# Patient Record
Sex: Female | Born: 1950 | Race: White | Hispanic: No | Marital: Married | Smoking: Never smoker
Health system: Southern US, Community
[De-identification: ages and names within clinical notes are randomized; demographics above are authoritative.]

## PROBLEM LIST (undated history)

## (undated) DIAGNOSIS — IMO0001 Reserved for inherently not codable concepts without codable children: Secondary | ICD-10-CM

## (undated) DIAGNOSIS — T8859XA Other complications of anesthesia, initial encounter: Secondary | ICD-10-CM

## (undated) DIAGNOSIS — I10 Essential (primary) hypertension: Secondary | ICD-10-CM

## (undated) DIAGNOSIS — T148XXA Other injury of unspecified body region, initial encounter: Secondary | ICD-10-CM

## (undated) DIAGNOSIS — E785 Hyperlipidemia, unspecified: Secondary | ICD-10-CM

## (undated) DIAGNOSIS — A0811 Acute gastroenteropathy due to Norwalk agent: Secondary | ICD-10-CM

## (undated) DIAGNOSIS — R000 Tachycardia, unspecified: Secondary | ICD-10-CM

## (undated) DIAGNOSIS — M722 Plantar fascial fibromatosis: Secondary | ICD-10-CM

## (undated) DIAGNOSIS — R112 Nausea with vomiting, unspecified: Secondary | ICD-10-CM

## (undated) DIAGNOSIS — Z9889 Other specified postprocedural states: Secondary | ICD-10-CM

## (undated) DIAGNOSIS — R011 Cardiac murmur, unspecified: Secondary | ICD-10-CM

## (undated) DIAGNOSIS — T4145XA Adverse effect of unspecified anesthetic, initial encounter: Secondary | ICD-10-CM

## (undated) DIAGNOSIS — N6019 Diffuse cystic mastopathy of unspecified breast: Secondary | ICD-10-CM

## (undated) DIAGNOSIS — I2510 Atherosclerotic heart disease of native coronary artery without angina pectoris: Secondary | ICD-10-CM

## (undated) DIAGNOSIS — M1990 Unspecified osteoarthritis, unspecified site: Secondary | ICD-10-CM

## (undated) HISTORY — PX: HYSTEROSCOPY: SHX211

## (undated) HISTORY — DX: Essential (primary) hypertension: I10

## (undated) HISTORY — DX: Other injury of unspecified body region, initial encounter: T14.8XXA

## (undated) HISTORY — DX: Tachycardia, unspecified: R00.0

## (undated) HISTORY — DX: Acute gastroenteropathy due to Norwalk agent: A08.11

## (undated) HISTORY — DX: Atherosclerotic heart disease of native coronary artery without angina pectoris: I25.10

## (undated) HISTORY — DX: Hyperlipidemia, unspecified: E78.5

## (undated) HISTORY — DX: Diffuse cystic mastopathy of unspecified breast: N60.19

## (undated) HISTORY — PX: COLONOSCOPY: SHX174

## (undated) HISTORY — DX: Plantar fascial fibromatosis: M72.2

---

## 1999-03-17 ENCOUNTER — Other Ambulatory Visit: Admission: RE | Admit: 1999-03-17 | Discharge: 1999-03-17 | Payer: Self-pay | Admitting: *Deleted

## 1999-03-23 ENCOUNTER — Encounter: Payer: Self-pay | Admitting: *Deleted

## 1999-03-23 ENCOUNTER — Ambulatory Visit (HOSPITAL_COMMUNITY): Admission: RE | Admit: 1999-03-23 | Discharge: 1999-03-23 | Payer: Self-pay | Admitting: *Deleted

## 2000-04-08 ENCOUNTER — Other Ambulatory Visit: Admission: RE | Admit: 2000-04-08 | Discharge: 2000-04-08 | Payer: Self-pay | Admitting: *Deleted

## 2000-04-15 ENCOUNTER — Ambulatory Visit (HOSPITAL_COMMUNITY): Admission: RE | Admit: 2000-04-15 | Discharge: 2000-04-15 | Payer: Self-pay | Admitting: Obstetrics and Gynecology

## 2000-04-15 ENCOUNTER — Encounter (INDEPENDENT_AMBULATORY_CARE_PROVIDER_SITE_OTHER): Payer: Self-pay | Admitting: Specialist

## 2001-05-19 ENCOUNTER — Other Ambulatory Visit: Admission: RE | Admit: 2001-05-19 | Discharge: 2001-05-19 | Payer: Self-pay | Admitting: *Deleted

## 2002-05-25 ENCOUNTER — Other Ambulatory Visit: Admission: RE | Admit: 2002-05-25 | Discharge: 2002-05-25 | Payer: Self-pay | Admitting: Obstetrics and Gynecology

## 2003-06-07 ENCOUNTER — Other Ambulatory Visit: Admission: RE | Admit: 2003-06-07 | Discharge: 2003-06-07 | Payer: Self-pay | Admitting: Obstetrics and Gynecology

## 2004-07-10 ENCOUNTER — Other Ambulatory Visit: Admission: RE | Admit: 2004-07-10 | Discharge: 2004-07-10 | Payer: Self-pay | Admitting: Obstetrics and Gynecology

## 2004-12-13 DIAGNOSIS — M722 Plantar fascial fibromatosis: Secondary | ICD-10-CM

## 2004-12-13 HISTORY — DX: Plantar fascial fibromatosis: M72.2

## 2005-09-24 ENCOUNTER — Other Ambulatory Visit: Admission: RE | Admit: 2005-09-24 | Discharge: 2005-09-24 | Payer: Self-pay | Admitting: Obstetrics and Gynecology

## 2006-10-14 ENCOUNTER — Other Ambulatory Visit: Admission: RE | Admit: 2006-10-14 | Discharge: 2006-10-14 | Payer: Self-pay | Admitting: Obstetrics and Gynecology

## 2006-12-13 HISTORY — PX: SPINAL FUSION: SHX223

## 2007-03-29 ENCOUNTER — Inpatient Hospital Stay (HOSPITAL_COMMUNITY): Admission: RE | Admit: 2007-03-29 | Discharge: 2007-03-30 | Payer: Self-pay | Admitting: Neurosurgery

## 2007-12-15 ENCOUNTER — Other Ambulatory Visit: Admission: RE | Admit: 2007-12-15 | Discharge: 2007-12-15 | Payer: Self-pay | Admitting: Obstetrics and Gynecology

## 2008-04-02 ENCOUNTER — Encounter: Payer: Self-pay | Admitting: Internal Medicine

## 2011-01-14 ENCOUNTER — Encounter: Payer: Self-pay | Admitting: Internal Medicine

## 2011-02-22 ENCOUNTER — Other Ambulatory Visit: Payer: Self-pay | Admitting: *Deleted

## 2011-02-23 MED ORDER — GLIMEPIRIDE 2 MG PO TABS: ORAL_TABLET | ORAL | Status: DC

## 2011-04-30 NOTE — Op Note (Signed)
Ashley Mcdonald, Ashley Mcdonald                ACCOUNT NO.:  [id number]   MEDICAL RECORD NO.:  [id number]          PATIENT TYPE:  INP   LOCATION:  NA                           FACILITY:  [facility]   PHYSICIAN:  Hewitt Shorts, M.D.DATE OF BIRTH:  June 17, 1951   DATE OF PROCEDURE:  03/29/2007  DATE OF DISCHARGE:                               OPERATIVE REPORT   PREOPERATIVE DIAGNOSES:  1. C5-6 and C6-7 cervical disk herniation.  2. Cervical spondylosis.  3. Cervical degenerative disk disease.  4. Cervical radiculopathy.   POSTOPERATIVE DIAGNOSES:  1. C5-6 and C6-7 cervical disk herniation.  2. Cervical spondylosis.  3. Cervical degenerative disk disease.  4. Cervical radiculopathy.   PROCEDURE:  C5-6 and C6-7 anterior cervical decompression and  arthrodesis with allograft and tether cervical plating.   SURGEON:  Hewitt Shorts, M.D.   ASSISTANT:  Stefani Dama, M.D.   ANESTHESIA:  General endotracheal.   INDICATION:  The patient is a 60 year old woman who presented with a  right cervical radiculopathy.  X-rays and MRI scan revealed advanced  spondylosis and degenerative disk disease at the C5-6 and C6-7 levels.  There is a central disk herniation at C5-6.  There is a large  osteophytic spur in the right C6-7 neural foramen and bilateral  osteophytic spurring encroaching on the C5-6 neural foramina.  Decision  was made to proceed with a 2-level anterior cervical diskectomy and  arthrodesis.   PROCEDURE:  The patient was brought to the operating room, placed under  general endotracheal anesthesia.  The patient was placed in 10 pounds of  Holter traction and the neck was prepped with Betadine soap solution and  draped in a sterile fashion.  A horizontal incision was made on the left  side of the neck.  The line of the incision was infiltrated with local  anesthetic with epinephrine.  The incision was carried down through the  subcutaneous tissue and platysma.  Bipolar cautery was  used to maintain  hemostasis.  Dissection was then carried out through an avascular plane  and the sternocleidomastoid, carotid artery and jugular vein laterally,  the trachea and esophagus medially.  The ventral aspect of the vertebral  column was identified.  Localizing x-ray was taken and a C5-6 and C6-7  intravertebral disk space is identified .  Diskectomy was begun at each  level with the scissors at annulus and continued with micro curets and  pituitary rongeurs.  There was significant anterior osteophytic  overgrowth at each level and this was removed using an osteophyte  removal tool along with the EXMax drill.  The microscope was draped and  brought into the field to provide additional magnification, illumination  and visualization and the remainder of the decompression was performed  using microdissection and microsurgical technique.  The cauterized end  plates were degenerated spondylitically and were removed them using  micro curets along with the EXMax drill.  There was significant  posterior osteophytic overgrowth and this was similarly removed using  the EXMax drill and 2-mm Kerrison punch with a thin foot plate.  The  posterior longitudinal ligament was  carefully opened and the posterior  longitudinal ligament was then removed along with further spondylytic  overgrowth and the spinal canal and the thecal sac were decompressed at  each level.   We then turned our attention to the neural foramina.  There was large  osteophytes encroaching upon the neuroforamen bilaterally at C5-6 and  particularly to the right at C6-7.  Each of these were carefully  removed, decompressing the neural foramen and exiting nerve roots.  Once  the decompression was completed, hemostasis was established with the use  of Gelfoam soaked in thrombin.  We measured the heights of the  intravertebral disk space and selected a 6-mm graft for the C5-6 level  and a 7-mm graft for the C6-7 level.  Each of  these allografts was  hydrated in saline solution and positioned in the intravertebral disk  space and countersunk.  We then discontinued the cervical traction and  selected a 29 mm tether cervical plate and was positioned over the  fusion construct and secured to the vertebra with 4 x 14 mm variable  angle screws, placing a pair of screws at C5, a single screw at C6 and a  pair of screws at C7.  Each screw hole was drilled and tapped and the  screws placed in an alternating fashion.  Once all 5 screws were in  place, final tightening was performed.  The wound was irrigated with  bacitracin solution, checked for hemostasis which was established and  confirmed.  An x-ray was taken.  We could only see the screws at C5  which appeared to be in good position.  Overall alignment appeared good.  Once hemostasis was confirmed, we proceeded with closure.  The platysma  was closed with interrupted inverted 2-0 undyed Vicryl sutures.  The  subcutaneous and subcuticular were closed with interrupted inverted 3-0  undyed Vicryls sutures and the skin was reapproximated with Dermabond.  The procedure was tolerated well.  The estimated blood loss was less  than 50 mL.  Sponge and needle count were correct.   Following surgery, the patient had been taken out of cervical traction  once the bone grafts were in place and prior to the plating was placed  in a soft cervical collar to be reversed from anesthetic, extubated and  transferred to the recovery room for further care.      Hewitt Shorts, M.D.  Electronically Signed     RWN/MEDQ  D:  03/29/2007  T:  03/29/2007  Job:  [id number]

## 2011-04-30 NOTE — Op Note (Signed)
[facility]  Patient:    DENNY, Ashley Mcdonald                       MRN: [id number] Proc. Date: 04/15/00 Adm. Date:  [id number] Attending:  Brynda Peon                           Operative Report  PREOPERATIVE DIAGNOSIS:       Large cervical polyp, rule out endometrial polyp.  POSTOPERATIVE DIAGNOSIS:      Cervical and endometrial polyp.  PROCEDURE:                    Hysteroscopy, D&C, polypectomy.  SURGEON:                      Cynthia P. Ashley Royalty, M.D.  ANESTHESIA:                   General endotracheal anesthesia.  ESTIMATED BLOOD LOSS:         Minimal.  COMPLICATIONS:                None.  DESCRIPTION OF PROCEDURE:     The patient was taken to the operating room and after the induction of adequate general anesthesia by LMA, she was placed in the dorsal lithotomy position and prepped and draped in the usual fashion.  The cervix was  visualized and grasped on its anterior lip with a single tooth tenaculum.  The uterus was sounded to 7 cm.  The cervix was dilated to a #25 Shawnie Pons.  The diagnostic hysteroscope was introduced and two endometrial polyps were noted, one in the right cornu and one off the anterior fundus.  The hysteroscope was removed, polyp forceps were introduced in an attempt to remove the polyps.  D&C was then done.  The hysteroscope was then reinserted.  One of the polyps was still present.  The hysteroscope was removed.  Polyp forceps were again reintroduced with removal of the polyp.  D&C was repeated.  The hysteroscope was then put back in and the endometrial cavity was visualized and felt to be clear.  The endocervix was clear. The large polyp was off the ectocervix and an attempt was made to twist the polyp off, but the base was too large and the polyp tore at the base.  Scissors were sed to excise the base of the polyp off the cervix and then cautery was used for hemostasis.  The specimen was sent for  pathology.  The procedure was terminated. The patient tolerated it well and went in satisfactory condition to postanesthesia recovery room. DD:  04/15/00 TD:  04/16/00 Job: [id number]

## 2012-02-24 ENCOUNTER — Other Ambulatory Visit: Payer: Self-pay | Admitting: Neurosurgery

## 2012-02-24 DIAGNOSIS — M542 Cervicalgia: Secondary | ICD-10-CM

## 2012-02-24 DIAGNOSIS — M47812 Spondylosis without myelopathy or radiculopathy, cervical region: Secondary | ICD-10-CM

## 2012-03-17 ENCOUNTER — Ambulatory Visit
Admission: RE | Admit: 2012-03-17 | Discharge: 2012-03-17 | Disposition: A | Discharge status: Home / Self Care | Payer: Managed Care, Other (non HMO) | Source: Ambulatory Visit | Attending: Neurosurgery | Admitting: Neurosurgery

## 2012-03-17 DIAGNOSIS — M542 Cervicalgia: Secondary | ICD-10-CM

## 2012-03-17 DIAGNOSIS — M47812 Spondylosis without myelopathy or radiculopathy, cervical region: Secondary | ICD-10-CM

## 2012-08-17 ENCOUNTER — Encounter (HOSPITAL_BASED_OUTPATIENT_CLINIC_OR_DEPARTMENT_OTHER): Payer: Managed Care, Other (non HMO) | Attending: Internal Medicine

## 2012-08-17 DIAGNOSIS — E785 Hyperlipidemia, unspecified: Secondary | ICD-10-CM | POA: Insufficient documentation

## 2012-08-17 DIAGNOSIS — W64XXXA Exposure to other animate mechanical forces, initial encounter: Secondary | ICD-10-CM | POA: Insufficient documentation

## 2012-08-17 DIAGNOSIS — Z79899 Other long term (current) drug therapy: Secondary | ICD-10-CM | POA: Insufficient documentation

## 2012-08-17 DIAGNOSIS — S8780XA Crushing injury of unspecified lower leg, initial encounter: Secondary | ICD-10-CM | POA: Insufficient documentation

## 2012-08-17 DIAGNOSIS — L988 Other specified disorders of the skin and subcutaneous tissue: Secondary | ICD-10-CM | POA: Insufficient documentation

## 2012-08-17 DIAGNOSIS — I10 Essential (primary) hypertension: Secondary | ICD-10-CM | POA: Insufficient documentation

## 2012-08-17 NOTE — Progress Notes (Signed)
[facility]  NAME:  Ashley Mcdonald, Ashley Mcdonald                ACCOUNT NO.:  [id number]  MEDICAL RECORD NO.:  [id number]      DATE OF BIRTH:  07/23/1951  PHYSICIAN:  Maxwell Caul, M.D. VISIT DATE:  08/16/2012                                  OFFICE VISIT   [name] comes to [place], kindly referred by Dr. Lunette Stands, at [facility].  This is a 61 year old lady who was leading out her 61 year old horse when the horse stumbled and stepped directly onto her right calf.  She had immediate pain and swelling.  She was seen in emergency room and asked for where x-rays were said to be negative.  She was seen by Dr. Charlett Blake on August 27.  At that point, the feeling was she had a significant crush injury and soft tissue injury to the right posterior calf.  Her Achilles tendon was intact.  She has been able to plantar and dorsiflexor foot normally.  She is in a fair amount of pain although the pain is improving.  There has been no motor or sensory defect in the foot.  PAST MEDICAL HISTORY:  Degenerative disk disease, hyperlipidemia, hypertension.  CURRENT MEDICATIONS:  Zetia, losartan, calcium and vitamin D, glucosamine, chondroitin, cephalexin, on Keflex at 500 two times a day, and hydrocodone p.r.n.  SOCIAL HISTORY:  She works as Armed forces operational officer, has been out of work. She is asking to me actually about returning to work today.  PHYSICAL EXAMINATION:  VITAL SIGNS:  Temperature is 97.2 pulse 73, respirations 18, blood pressure 129/77. SKIN:  Wound exam is on the posterior right leg.  She has considerable amount of old bruising extending into her foot and up until her upper calf area.  There is a resolving hematoma here.  There is an area of superficial necrosis measuring 1 x 3.5 x 0.1, however there is no overt evidence of infection.  She has good dorsi and plantar flexion of the foot.  I do not think there has been any neurologic injury here.  IMPRESSION:   Significant crush injury of the right calf as noted above. Most of this appears to be stable, however she does have an area of necrosed skin as mentioned above.  We covered this with a foam and put her in a Kerlix, Coban wrap, which she can stay in for the rest of the week.  I do not think there is any need for debridement here.  I explained this carefully to the patient and husband who are present.  We will continue to follow the area of the hematoma resorbs to see if anything further needs to be done.  I suspected that this may open and I warned them about this.  They are to contact this if there is significant drainage or opening of the necrotic area of skin.          ______________________________ Maxwell Caul, M.D.     MGR/MEDQ  D:  08/17/2012  T:  08/17/2012  Job:  [id number]

## 2012-09-14 ENCOUNTER — Encounter (HOSPITAL_BASED_OUTPATIENT_CLINIC_OR_DEPARTMENT_OTHER): Payer: Managed Care, Other (non HMO) | Attending: Internal Medicine

## 2012-09-14 ENCOUNTER — Encounter (HOSPITAL_BASED_OUTPATIENT_CLINIC_OR_DEPARTMENT_OTHER): Payer: Managed Care, Other (non HMO)

## 2012-09-14 DIAGNOSIS — Y838 Other surgical procedures as the cause of abnormal reaction of the patient, or of later complication, without mention of misadventure at the time of the procedure: Secondary | ICD-10-CM | POA: Insufficient documentation

## 2012-09-14 DIAGNOSIS — S8010XA Contusion of unspecified lower leg, initial encounter: Secondary | ICD-10-CM | POA: Insufficient documentation

## 2012-09-14 DIAGNOSIS — T8189XA Other complications of procedures, not elsewhere classified, initial encounter: Secondary | ICD-10-CM | POA: Insufficient documentation

## 2012-10-19 ENCOUNTER — Encounter (HOSPITAL_BASED_OUTPATIENT_CLINIC_OR_DEPARTMENT_OTHER): Payer: Managed Care, Other (non HMO) | Attending: Internal Medicine

## 2012-10-19 DIAGNOSIS — T8189XA Other complications of procedures, not elsewhere classified, initial encounter: Secondary | ICD-10-CM | POA: Insufficient documentation

## 2012-10-19 DIAGNOSIS — Y849 Medical procedure, unspecified as the cause of abnormal reaction of the patient, or of later complication, without mention of misadventure at the time of the procedure: Secondary | ICD-10-CM | POA: Insufficient documentation

## 2012-11-16 ENCOUNTER — Encounter (HOSPITAL_BASED_OUTPATIENT_CLINIC_OR_DEPARTMENT_OTHER): Payer: Managed Care, Other (non HMO)

## 2013-09-20 ENCOUNTER — Encounter: Payer: Self-pay | Admitting: Certified Nurse Midwife

## 2013-09-21 ENCOUNTER — Ambulatory Visit (INDEPENDENT_AMBULATORY_CARE_PROVIDER_SITE_OTHER): Payer: PRIVATE HEALTH INSURANCE | Admitting: Certified Nurse Midwife

## 2013-09-21 ENCOUNTER — Encounter: Payer: Self-pay | Admitting: Certified Nurse Midwife

## 2013-09-21 VITALS — BP 120/64 | HR 64 | Resp 16 | Ht 63.25 in | Wt 169.0 lb

## 2013-09-21 DIAGNOSIS — F418 Other specified anxiety disorders: Secondary | ICD-10-CM

## 2013-09-21 DIAGNOSIS — Z01419 Encounter for gynecological examination (general) (routine) without abnormal findings: Secondary | ICD-10-CM

## 2013-09-21 DIAGNOSIS — F411 Generalized anxiety disorder: Secondary | ICD-10-CM

## 2013-09-21 DIAGNOSIS — Z0000 Encounter for general adult medical examination without abnormal findings: Secondary | ICD-10-CM

## 2013-09-21 DIAGNOSIS — F341 Dysthymic disorder: Secondary | ICD-10-CM

## 2013-09-21 DIAGNOSIS — N952 Postmenopausal atrophic vaginitis: Secondary | ICD-10-CM

## 2013-09-21 LAB — POCT URINALYSIS DIPSTICK
Bilirubin, UA: NEGATIVE
Ketones, UA: NEGATIVE
Nitrite, UA: NEGATIVE
Protein, UA: NEGATIVE
Urobilinogen, UA: NEGATIVE
pH, UA: 5

## 2013-09-21 MED ORDER — ESTRADIOL 10 MCG VA TABS: 1.0000 | ORAL_TABLET | VAGINAL | Status: DC

## 2013-09-21 MED ORDER — ESCITALOPRAM OXALATE 10 MG PO TABS
10.0000 mg | ORAL_TABLET | Freq: Every day | ORAL | Status: DC
Start: 2013-09-21 — End: 2013-10-04
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
## 2013-09-21 NOTE — Patient Instructions (Signed)

## 2013-09-27 NOTE — Progress Notes (Signed)
Note reviewed, agree with plan.  Torez Beauregard, MD  

## 2013-10-04 ENCOUNTER — Ambulatory Visit (INDEPENDENT_AMBULATORY_CARE_PROVIDER_SITE_OTHER): Payer: PRIVATE HEALTH INSURANCE | Admitting: Certified Nurse Midwife

## 2013-10-04 VITALS — BP 112/62 | HR 68 | Resp 16 | Ht 63.25 in | Wt 169.0 lb

## 2013-10-04 DIAGNOSIS — F411 Generalized anxiety disorder: Secondary | ICD-10-CM

## 2013-10-04 DIAGNOSIS — F341 Dysthymic disorder: Secondary | ICD-10-CM

## 2013-10-04 DIAGNOSIS — F418 Other specified anxiety disorders: Secondary | ICD-10-CM

## 2013-10-04 MED ORDER — ESCITALOPRAM OXALATE 10 MG PO TABS: 10.0000 mg | ORAL_TABLET | Freq: Every day | ORAL | Status: DC

## 2013-10-04 NOTE — Progress Notes (Signed)
62 y.o.MarriedCaucasianfemaleG1P0010here for follow-up of anxiety/depression being treated with  Lexapro 10 mg   Initiated October.10, 2014.  Patient taking medication as instructed PM.  Denies nausea, headache or other medication side effects. . Reports no crying, panic attacks } insomnia,  Fatigue or thoughts of self harm or others. .  Feelings of " anxiety and insomnia so much better".         Patient sleeping much better, without restless issues. "Can't believe the difference!" No problems with medication. Desires continuance  O:Healthy WD,WN female, appropriately dressed    Weight: Affect : Appropriate and orientation x 3    A:1-Anxiety responding to Lexapro 2-Counseling scheduled if needed  P:1- Continue medication as prescribed 2-RXLexapro 10 mg see order 3-RV 1 month, prn 4-Instructed if thoughts of self harm or others seek immediate help 911 or emergency room.  Questions addressed.            20 minutes spent with patient with >50% of time spent in face to face counseling.

## 2013-10-07 NOTE — Progress Notes (Signed)
Note reviewed, agree with plan.  Ransome Helwig, MD  

## 2013-10-09 ENCOUNTER — Ambulatory Visit: Payer: PRIVATE HEALTH INSURANCE | Admitting: Certified Nurse Midwife

## 2013-10-19 ENCOUNTER — Ambulatory Visit (INDEPENDENT_AMBULATORY_CARE_PROVIDER_SITE_OTHER): Payer: PRIVATE HEALTH INSURANCE | Admitting: Certified Nurse Midwife

## 2013-10-19 ENCOUNTER — Encounter: Payer: Self-pay | Admitting: Certified Nurse Midwife

## 2013-10-19 VITALS — BP 130/78 | HR 72 | Resp 16 | Wt 166.0 lb

## 2013-10-19 DIAGNOSIS — F341 Dysthymic disorder: Secondary | ICD-10-CM

## 2013-10-19 DIAGNOSIS — F411 Generalized anxiety disorder: Secondary | ICD-10-CM

## 2013-10-19 DIAGNOSIS — F418 Other specified anxiety disorders: Secondary | ICD-10-CM

## 2013-10-19 MED ORDER — ESCITALOPRAM OXALATE 10 MG PO TABS: 10.0000 mg | ORAL_TABLET | Freq: Every day | ORAL | Status: DC
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
## 2013-10-21 NOTE — Progress Notes (Signed)
Note reviewed, agree with plan.  Suzanne Kho, MD  

## 2013-10-22 ENCOUNTER — Other Ambulatory Visit: Payer: Self-pay | Admitting: *Deleted

## 2013-10-22 DIAGNOSIS — F418 Other specified anxiety disorders: Secondary | ICD-10-CM

## 2013-10-22 DIAGNOSIS — F411 Generalized anxiety disorder: Secondary | ICD-10-CM

## 2013-10-22 MED ORDER — ESCITALOPRAM OXALATE 10 MG PO TABS: 10.0000 mg | ORAL_TABLET | Freq: Every day | ORAL | Status: DC

## 2013-10-22 NOTE — Telephone Encounter (Signed)
Fax From: [facility] for  Lexapro 10 mg Last Refilled: 10/19/13 #3 tablets no refills   Aex was 09/21/13  Pharmacy was wanting to verify the quantity ?  Please advise   (Chart In Your Door)

## 2013-10-24 ENCOUNTER — Other Ambulatory Visit: Payer: Self-pay | Admitting: *Deleted

## 2013-11-07 ENCOUNTER — Telehealth: Payer: Self-pay | Admitting: *Deleted

## 2013-11-07 DIAGNOSIS — F418 Other specified anxiety disorders: Secondary | ICD-10-CM

## 2013-11-07 DIAGNOSIS — F411 Generalized anxiety disorder: Secondary | ICD-10-CM

## 2013-11-07 MED ORDER — ESCITALOPRAM OXALATE 10 MG PO TABS: 10.0000 mg | ORAL_TABLET | Freq: Every day | ORAL | Status: DC

## 2013-11-07 NOTE — Telephone Encounter (Signed)
Fax From: [facility] requesting 90 days supply of Vagifem Last Refilled: 10/19/13 #30/2 refills  Aex Scheduled: 10/18/14   Lexapro 10 mg #90/0refills [facility].

## 2014-01-18 ENCOUNTER — Encounter: Payer: Self-pay | Admitting: Certified Nurse Midwife

## 2014-01-18 ENCOUNTER — Ambulatory Visit (INDEPENDENT_AMBULATORY_CARE_PROVIDER_SITE_OTHER): Payer: PRIVATE HEALTH INSURANCE | Admitting: Certified Nurse Midwife

## 2014-01-18 VITALS — BP 120/82 | HR 72 | Resp 16 | Ht 63.25 in | Wt 164.0 lb

## 2014-01-18 DIAGNOSIS — Z0189 Encounter for other specified special examinations: Secondary | ICD-10-CM

## 2014-01-18 DIAGNOSIS — F341 Dysthymic disorder: Secondary | ICD-10-CM

## 2014-01-18 DIAGNOSIS — R319 Hematuria, unspecified: Secondary | ICD-10-CM

## 2014-01-18 DIAGNOSIS — F418 Other specified anxiety disorders: Secondary | ICD-10-CM

## 2014-01-18 DIAGNOSIS — F411 Generalized anxiety disorder: Secondary | ICD-10-CM

## 2014-01-18 LAB — POCT URINALYSIS DIPSTICK
Bilirubin, UA: NEGATIVE
Glucose, UA: NEGATIVE
KETONES UA: NEGATIVE
LEUKOCYTES UA: NEGATIVE
Nitrite, UA: NEGATIVE
PH UA: 5
PROTEIN UA: NEGATIVE
Urobilinogen, UA: NEGATIVE

## 2014-01-18 MED ORDER — ESCITALOPRAM OXALATE 10 MG PO TABS: ORAL_TABLET | ORAL | Status: DC

## 2014-01-18 NOTE — Patient Instructions (Signed)
Urinary Tract Infection °A urinary tract infection (UTI) can occur any place along the urinary tract. The tract includes the kidneys, ureters, bladder, and urethra. A type of germ called bacteria often causes a UTI. UTIs are often helped with antibiotic medicine.  °HOME CARE  °· If given, take antibiotics as told by your doctor. Finish them even if you start to feel better. °· Drink enough fluids to keep your pee (urine) clear or pale yellow. °· Avoid tea, drinks with caffeine, and bubbly (carbonated) drinks. °· Pee often. Avoid holding your pee in for a long time. °· Pee before and after having sex (intercourse). °· Wipe from front to back after you poop (bowel movement) if you are a woman. Use each tissue only once. °GET HELP RIGHT AWAY IF:  °· You have back pain. °· You have lower belly (abdominal) pain. °· You have chills. °· You feel sick to your stomach (nauseous). °· You throw up (vomit). °· Your burning or discomfort with peeing does not go away. °· You have a fever. °· Your symptoms are not better in 3 days. °MAKE SURE YOU:  °· Understand these instructions. °· Will watch your condition. °· Will get help right away if you are not doing well or get worse. °Document Released: 05/17/2008 Document Revised: 08/23/2012 Document Reviewed: 06/29/2012 °Exit[employer]® Patient Information ©2014 [employer]. ° °
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
## 2014-01-19 LAB — URINALYSIS, MICROSCOPIC ONLY
BACTERIA UA: NONE SEEN
Casts: NONE SEEN
Crystals: NONE SEEN
Squamous Epithelial / LPF: NONE SEEN

## 2014-01-20 LAB — URINE CULTURE
Colony Count: NO GROWTH
Organism ID, Bacteria: NO GROWTH

## 2014-01-25 NOTE — Progress Notes (Signed)
Reviewed personally.  M. Suzanne Johanne Mcglade, MD.  

## 2014-09-28 ENCOUNTER — Other Ambulatory Visit: Payer: Self-pay | Admitting: Certified Nurse Midwife

## 2014-09-30 NOTE — Telephone Encounter (Signed)
Last refilled/AEX 09/21/13 #8/12 rfs was sent to pharmacy Last Mammogram: 10/19/13 Bi-Rads 1 Aex scheduled for 10/18/14 with Ms. Debbie Vagifem #8/[facility] until AEX

## 2014-10-14 ENCOUNTER — Encounter: Payer: Self-pay | Admitting: Certified Nurse Midwife

## 2014-10-18 ENCOUNTER — Ambulatory Visit (INDEPENDENT_AMBULATORY_CARE_PROVIDER_SITE_OTHER): Payer: PRIVATE HEALTH INSURANCE | Admitting: Certified Nurse Midwife

## 2014-10-18 ENCOUNTER — Encounter: Payer: Self-pay | Admitting: Certified Nurse Midwife

## 2014-10-18 VITALS — BP 120/70 | HR 72 | Resp 16 | Ht 62.75 in | Wt 162.0 lb

## 2014-10-18 DIAGNOSIS — F411 Generalized anxiety disorder: Secondary | ICD-10-CM

## 2014-10-18 DIAGNOSIS — N952 Postmenopausal atrophic vaginitis: Secondary | ICD-10-CM

## 2014-10-18 DIAGNOSIS — Z01419 Encounter for gynecological examination (general) (routine) without abnormal findings: Secondary | ICD-10-CM

## 2014-10-18 DIAGNOSIS — Z0000 Encounter for general adult medical examination without abnormal findings: Secondary | ICD-10-CM

## 2014-10-18 DIAGNOSIS — Z124 Encounter for screening for malignant neoplasm of cervix: Secondary | ICD-10-CM

## 2014-10-18 LAB — POCT URINALYSIS DIPSTICK
Bilirubin, UA: NEGATIVE
Glucose, UA: NEGATIVE
Ketones, UA: NEGATIVE
Leukocytes, UA: NEGATIVE
Nitrite, UA: NEGATIVE
Protein, UA: NEGATIVE
RBC UA: NEGATIVE
UROBILINOGEN UA: NEGATIVE
pH, UA: 5

## 2014-10-18 LAB — HEMOGLOBIN, FINGERSTICK: Hemoglobin, fingerstick: 13.9 g/dL (ref 12.0–16.0)

## 2014-10-18 MED ORDER — ESCITALOPRAM OXALATE 10 MG PO TABS: ORAL_TABLET | ORAL | Status: DC

## 2014-10-18 MED ORDER — ESTRADIOL 10 MCG VA TABS: ORAL_TABLET | VAGINAL | Status: DC

## 2014-10-18 NOTE — Patient Instructions (Signed)

## 2014-10-18 NOTE — Progress Notes (Signed)
63 y.o. G39P0010 Married Caucasian Fe here for annual exam. Menopausal no vaginal bleeding or vaginal dryness.  Currently under treatment with Cipro for UTI per PCP. Feeling much better. Sees PCP for  Labs and aex/ hypertension management..  Anxiety and depression are so much better with Lexapro. Desires continuance. Vagifem working well for vaginal dryness. No other health issues.  Patient's last menstrual period was 09/12/2002.          Sexually active: Yes.    The current method of family planning is post menopausal status.    Exercising: No.  exercise Smoker:  no  Health Maintenance: Pap:  09-15-12 neg HPV HR neg MMG:  10-19-13 density category b, birads category 1:neg, scheduled for 2015 Colonoscopy:  2007 f/u 19yrs BMD:   12/12 TDaP:  2014 Labs: Poct urine-neg, Hgb-13.9 Self breast exam: done monthly   reports that she has never smoked. She does not have any smokeless tobacco history on file. She reports that she does not drink alcohol or use illicit drugs.  Past Medical History  Diagnosis Date  . Hypertension   . Hyperlipidemia   . Cystic breast   . Plantar fasciitis 2006    right  . Puncture wound     right leg  . Norovirus     Past Surgical History  Procedure Laterality Date  . Hysteroscopy      D&C polypectomy  . Spinal fusion  2008    c5-7    Current Outpatient Prescriptions  Medication Sig Dispense Refill  . Acetaminophen (TYLENOL PO) Take by mouth as needed.    . Calcium Carbonate (CALCIUM 600 PO) Take by mouth 2 (two) times daily.    . ciprofloxacin (CIPRO) 500 MG tablet   0  . CRANBERRY PO Take by mouth. 2 daily    . cyclobenzaprine (FLEXERIL) 5 MG tablet as needed.     . ergocalciferol (VITAMIN D2) 50000 UNITS capsule Take 50,000 Units by mouth 2 (two) times a week.     . escitalopram (LEXAPRO) 10 MG tablet 1/2 tablet daily at hs (Patient taking differently: 3/4 tablet daily at hs) 90 tablet 1  . Ezetimibe (ZETIA PO) Take by mouth daily.    Marland Kitchen GLUCOSAMINE  CHONDROITIN COMPLX PO Take by mouth.    . losartan-hydrochlorothiazide (HYZAAR) 100-12.5 MG per tablet Take 1 tablet by mouth daily.    . Nutritional Supplements (JUICE PLUS FIBRE PO) Take by mouth daily.    . Omega-3 Fatty Acids (FISH OIL PO) Take by mouth daily.    . Probiotic Product (PROBIOTIC PO) Take by mouth daily.    Marland Kitchen VAGIFEM 10 MCG TABS vaginal tablet PLACE 1 TABLET VAGINALLY 2 TIMES A WEEK. USE 2 TIMES WEEKLY ONLY 8 tablet 0   No current facility-administered medications for this visit.    Family History  Problem Relation Age of Onset  . Stroke Mother   . Heart attack Father   . Cancer Maternal Grandmother     uterine?  . Diabetes Paternal Grandmother     ROS:  Pertinent items are noted in HPI.  Otherwise, a comprehensive ROS was negative.  Exam:   BP 120/70 mmHg  Pulse 72  Resp 16  Ht 5' 2.75" (1.594 m)  Wt 162 lb (73.483 kg)  BMI 28.92 kg/m2  LMP 09/12/2002 Height: 5' 2.75" (159.4 cm)  Ht Readings from Last 3 Encounters:  10/18/14 5' 2.75" (1.594 m)  01/18/14 5' 3.25" (1.607 m)  10/04/13 5' 3.25" (1.607 m)    General appearance:  alert, cooperative and appears stated age Head: Normocephalic, without obvious abnormality, atraumatic Neck: no adenopathy, supple, symmetrical, trachea midline and thyroid normal to inspection and palpation Lungs: clear to auscultation bilaterally Breasts: normal appearance, no masses or tenderness, No nipple retraction or dimpling, No nipple discharge or bleeding, No axillary or supraclavicular adenopathy Heart: regular rate and rhythm Abdomen: soft, non-tender; no masses,  no organomegaly Extremities: extremities normal, atraumatic, no cyanosis or edema Skin: Skin color, texture, turgor normal. No rashes or lesions Lymph nodes: Cervical, supraclavicular, and axillary nodes normal. No abnormal inguinal nodes palpated Neurologic: Grossly normal   Pelvic: External genitalia:  no lesions              Urethra:  normal appearing  urethra with no masses, tenderness or lesions              Bartholin's and Skene's: normal                 Vagina: atrophic appearing vagina with normal color and discharge, no lesions              Cervix: normal ,non tender, no lesions              Pap taken: Yes.   Bimanual Exam:  Uterus:  normal size, contour, position, consistency, mobility, non-tender and mid position              Adnexa: normal adnexa and no mass, fullness, tenderness               Rectovaginal: Confirms               Anus:  normal sphincter tone, no lesions  A:  Well Woman with normal exam  Menopausal no HRT  Atrophic Vaginitis Vagifem working well  UTI under treatment with Cipro  Hypertension stable medication with PCP management  Anxiety/Depression responding well to Lexapro, desires continuance  P:   Reviewed health and wellness pertinent to exam  Aware of need to evaluate if vaginal bleeding  Rx Vagifem see order  Continue follow up as indicated  Rx Lexapro see order  Pap smear taken today with HPV reflex  counseled on breast self exam, mammography screening, adequate intake of calcium and vitamin D, diet and exercise, Kegel's exercises  return annually or prn  An After Visit Summary was printed and given to the patient.

## 2014-10-21 ENCOUNTER — Encounter: Payer: Self-pay | Admitting: Certified Nurse Midwife

## 2014-10-22 LAB — IPS PAP TEST WITH REFLEX TO HPV

## 2014-10-22 NOTE — Progress Notes (Signed)
Reviewed personally.  M. Suzanne Lamorris Knoblock, MD.  

## 2015-10-24 ENCOUNTER — Ambulatory Visit (INDEPENDENT_AMBULATORY_CARE_PROVIDER_SITE_OTHER): Payer: BLUE CROSS/BLUE SHIELD | Admitting: Certified Nurse Midwife

## 2015-10-24 ENCOUNTER — Encounter: Payer: Self-pay | Admitting: Certified Nurse Midwife

## 2015-10-24 VITALS — BP 120/72 | HR 76 | Resp 20 | Ht 62.75 in | Wt 166.0 lb

## 2015-10-24 DIAGNOSIS — Z0000 Encounter for general adult medical examination without abnormal findings: Secondary | ICD-10-CM

## 2015-10-24 DIAGNOSIS — Z01419 Encounter for gynecological examination (general) (routine) without abnormal findings: Secondary | ICD-10-CM

## 2015-10-24 LAB — POCT URINALYSIS DIPSTICK
BILIRUBIN UA: NEGATIVE
Blood, UA: NEGATIVE
Glucose, UA: NEGATIVE
KETONES UA: NEGATIVE
LEUKOCYTES UA: NEGATIVE
Nitrite, UA: NEGATIVE
Protein, UA: NEGATIVE
Urobilinogen, UA: NEGATIVE
pH, UA: 5

## 2015-10-24 MED ORDER — ESTRADIOL 10 MCG VA TABS: 1.0000 | ORAL_TABLET | VAGINAL | Status: DC

## 2015-10-24 NOTE — Patient Instructions (Signed)

## 2015-10-24 NOTE — Progress Notes (Signed)
64 y.o. G69P0010 Married  Caucasian Fe here for annual exam. Menopausal no HRT, denies vaginal bleeding or vaginal dryness. Working part time now, which is much better with chronic back pain. Sees PCP for aex/labs/hypertension/cholesterol management. All medication stable at this time. Complaining of vaginal dryness again. Would like to try Vagifem again with coconut oil for sexual activity. No other health issues today.  Patient's last menstrual period was 09/12/2002.          Sexually active: No.  The current method of family planning is post menopausal status.    Exercising: No.  exercise Smoker:  no  Health Maintenance: Pap:  10-18-14 neg MMG:  11-01-14 category b density,birads 1:neg Colonoscopy:  2007 fu 37yrs BMD:   2014 TDaP:  2014 Labs: poct urine-neg Self breast exam: done monthly     reports that she has never smoked. She does not have any smokeless tobacco history on file. She reports that she does not drink alcohol or use illicit drugs.  Past Medical History  Diagnosis Date  . Hypertension   . Hyperlipidemia   . Cystic breast   . Plantar fasciitis 2006    right  . Puncture wound     right leg  . Norovirus     Past Surgical History  Procedure Laterality Date  . Hysteroscopy      D&C polypectomy  . Spinal fusion  2008    c5-7    Current Outpatient Prescriptions  Medication Sig Dispense Refill  . Acetaminophen (TYLENOL PO) Take by mouth as needed.    Marland Kitchen amLODipine (NORVASC) 5 MG tablet Take 5 mg by mouth daily.  3  . Calcium Carbonate (CALCIUM 600 PO) Take by mouth 2 (two) times daily.    Marland Kitchen CRANBERRY PO Take by mouth. 2 daily    . cyclobenzaprine (FLEXERIL) 5 MG tablet as needed.     . ergocalciferol (VITAMIN D2) 50000 UNITS capsule Take 50,000 Units by mouth 2 (two) times a week.     Marland Kitchen GLUCOSAMINE CHONDROITIN COMPLX PO Take by mouth.    . Nutritional Supplements (JUICE PLUS FIBRE PO) Take by mouth daily.    . Omega-3 Fatty Acids (FISH OIL PO) Take by mouth  daily.    . Probiotic Product (PROBIOTIC PO) Take by mouth daily.    . Red Yeast Rice 600 MG CAPS Take by mouth daily.    Marland Kitchen telmisartan-hydrochlorothiazide (MICARDIS HCT) 40-12.5 MG tablet Take 1 tablet by mouth daily.  3   No current facility-administered medications for this visit.    Family History  Problem Relation Age of Onset  . Stroke Mother   . Heart attack Father   . Cancer Maternal Grandmother     uterine?  . Diabetes Paternal Grandmother     ROS:  Pertinent items are noted in HPI.  Otherwise, a comprehensive ROS was negative.  Exam:   BP 120/72 mmHg  Pulse 76  Resp 20  Ht 5' 2.75" (1.594 m)  Wt 166 lb (75.297 kg)  BMI 29.63 kg/m2  LMP 09/12/2002 Height: 5' 2.75" (159.4 cm) Ht Readings from Last 3 Encounters:  10/24/15 5' 2.75" (1.594 m)  10/18/14 5' 2.75" (1.594 m)  01/18/14 5' 3.25" (1.607 m)    General appearance: alert, cooperative and appears stated age Head: Normocephalic, without obvious abnormality, atraumatic Neck: no adenopathy, supple, symmetrical, trachea midline and thyroid normal to inspection and palpation Lungs: clear to auscultation bilaterally Breasts: normal appearance, no masses or tenderness, No nipple retraction or dimpling, No nipple  discharge or bleeding, No axillary or supraclavicular adenopathy Heart: regular rate and rhythm Abdomen: soft, non-tender; no masses,  no organomegaly Extremities: extremities normal, atraumatic, no cyanosis or edema Skin: Skin color, texture, turgor normal. No rashes or lesions Lymph nodes: Cervical, supraclavicular, and axillary nodes normal. No abnormal inguinal nodes palpated Neurologic: Grossly normal   Pelvic: External genitalia:  no lesions              Urethra:  normal appearing urethra with no masses, tenderness or lesions              Bartholin's and Skene's: normal                 Vagina: normal appearing vagina with palel color and scant moisture, no lesions              Cervix: normal,non  tender, no lesions              Pap taken: No. Bimanual Exam:  Uterus:  normal size, contour, position, consistency, mobility, non-tender              Adnexa: normal adnexa and no mass, fullness, tenderness               Rectovaginal: Confirms               Anus:  normal sphincter tone, no lesions  Chaperone present: yes  A:  Well Woman with normal exam  Menopausal no HRT  Vaginal Dryness desires Vagifem again worked well before  Hypertension/cholesterol with PCP management  P:   Reviewed health and wellness pertinent to exam  Aware of need to evaluate if vaginal bleeding  Rx Vagifem see order  Pap smear as above not taken today   counseled on breast self exam, mammography screening, adequate intake of calcium and vitamin D, diet and exercise  return annually or prn  An After Visit Summary was printed and given to the patient.

## 2015-10-25 NOTE — Progress Notes (Signed)
Reviewed personally.  M. Suzanne Anwitha Mapes, MD.  

## 2015-11-28 DIAGNOSIS — M1811 Unilateral primary osteoarthritis of first carpometacarpal joint, right hand: Secondary | ICD-10-CM

## 2015-11-28 DIAGNOSIS — M65319 Trigger thumb, unspecified thumb: Secondary | ICD-10-CM

## 2015-11-28 DIAGNOSIS — M189 Osteoarthritis of first carpometacarpal joint, unspecified: Secondary | ICD-10-CM | POA: Insufficient documentation

## 2015-11-28 DIAGNOSIS — M65311 Trigger thumb, right thumb: Secondary | ICD-10-CM

## 2015-11-28 HISTORY — DX: Trigger thumb, unspecified thumb: M65.319

## 2015-11-28 HISTORY — DX: Trigger thumb, right thumb: M65.311

## 2015-11-28 HISTORY — DX: Unilateral primary osteoarthritis of first carpometacarpal joint, right hand: M18.11

## 2015-11-28 HISTORY — DX: Osteoarthritis of first carpometacarpal joint, unspecified: M18.9

## 2016-03-04 ENCOUNTER — Other Ambulatory Visit: Payer: Self-pay | Admitting: Orthopedic Surgery

## 2016-03-09 ENCOUNTER — Encounter (HOSPITAL_COMMUNITY): Payer: Self-pay

## 2016-03-09 ENCOUNTER — Ambulatory Visit (HOSPITAL_COMMUNITY)
Admission: RE | Admit: 2016-03-09 | Discharge: 2016-03-09 | Disposition: A | Discharge status: Home / Self Care | Payer: BLUE CROSS/BLUE SHIELD | Source: Ambulatory Visit | Attending: Orthopedic Surgery | Admitting: Orthopedic Surgery

## 2016-03-09 ENCOUNTER — Encounter (HOSPITAL_COMMUNITY)
Admission: RE | Admit: 2016-03-09 | Discharge: 2016-03-09 | Disposition: A | Discharge status: Home / Self Care | Payer: BLUE CROSS/BLUE SHIELD | Source: Ambulatory Visit | Attending: Orthopedic Surgery | Admitting: Orthopedic Surgery

## 2016-03-09 DIAGNOSIS — R9431 Abnormal electrocardiogram [ECG] [EKG]: Secondary | ICD-10-CM | POA: Diagnosis not present

## 2016-03-09 DIAGNOSIS — Z01818 Encounter for other preprocedural examination: Secondary | ICD-10-CM | POA: Insufficient documentation

## 2016-03-09 DIAGNOSIS — Z01812 Encounter for preprocedural laboratory examination: Secondary | ICD-10-CM | POA: Diagnosis not present

## 2016-03-09 DIAGNOSIS — Z01810 Encounter for preprocedural cardiovascular examination: Secondary | ICD-10-CM | POA: Diagnosis not present

## 2016-03-09 HISTORY — DX: Cardiac murmur, unspecified: R01.1

## 2016-03-09 HISTORY — DX: Adverse effect of unspecified anesthetic, initial encounter: T41.45XA

## 2016-03-09 HISTORY — DX: Other complications of anesthesia, initial encounter: T88.59XA

## 2016-03-09 HISTORY — DX: Other specified postprocedural states: Z98.890

## 2016-03-09 HISTORY — DX: Reserved for inherently not codable concepts without codable children: IMO0001

## 2016-03-09 HISTORY — DX: Other specified postprocedural states: R11.2

## 2016-03-09 HISTORY — DX: Unspecified osteoarthritis, unspecified site: M19.90

## 2016-03-09 LAB — CBC WITH DIFFERENTIAL/PLATELET
BASOS PCT: 0 %
Basophils Absolute: 0 10*3/uL (ref 0.0–0.1)
Eosinophils Absolute: 0 10*3/uL (ref 0.0–0.7)
Eosinophils Relative: 1 %
HEMATOCRIT: 41.2 % (ref 36.0–46.0)
HEMOGLOBIN: 14.1 g/dL (ref 12.0–15.0)
LYMPHS ABS: 1 10*3/uL (ref 0.7–4.0)
Lymphocytes Relative: 20 %
MCH: 28.5 pg (ref 26.0–34.0)
MCHC: 34.2 g/dL (ref 30.0–36.0)
MCV: 83.2 fL (ref 78.0–100.0)
MONO ABS: 0.5 10*3/uL (ref 0.1–1.0)
MONOS PCT: 11 %
NEUTROS ABS: 3.2 10*3/uL (ref 1.7–7.7)
NEUTROS PCT: 68 %
Platelets: 210 10*3/uL (ref 150–400)
RBC: 4.95 MIL/uL (ref 3.87–5.11)
RDW: 13.1 % (ref 11.5–15.5)
WBC: 4.8 10*3/uL (ref 4.0–10.5)

## 2016-03-09 LAB — SURGICAL PCR SCREEN
MRSA, PCR: NEGATIVE
STAPHYLOCOCCUS AUREUS: NEGATIVE

## 2016-03-09 LAB — URINALYSIS, ROUTINE W REFLEX MICROSCOPIC
Bilirubin Urine: NEGATIVE
Glucose, UA: NEGATIVE mg/dL
HGB URINE DIPSTICK: NEGATIVE
Ketones, ur: NEGATIVE mg/dL
Leukocytes, UA: NEGATIVE
NITRITE: NEGATIVE
PROTEIN: NEGATIVE mg/dL
Specific Gravity, Urine: 1.027 (ref 1.005–1.030)
pH: 5 (ref 5.0–8.0)

## 2016-03-09 LAB — TYPE AND SCREEN
ABO/RH(D): A POS
Antibody Screen: NEGATIVE

## 2016-03-09 LAB — COMPREHENSIVE METABOLIC PANEL
ALBUMIN: 4.3 g/dL (ref 3.5–5.0)
ALK PHOS: 72 U/L (ref 38–126)
ALT: 28 U/L (ref 14–54)
AST: 28 U/L (ref 15–41)
Anion gap: 7 (ref 5–15)
BUN: 8 mg/dL (ref 6–20)
CALCIUM: 9.7 mg/dL (ref 8.9–10.3)
CO2: 29 mmol/L (ref 22–32)
CREATININE: 0.7 mg/dL (ref 0.44–1.00)
Chloride: 106 mmol/L (ref 101–111)
GFR calc Af Amer: >60 mL/min (ref >60)
GFR calc non Af Amer: >60 mL/min (ref >60)
GLUCOSE: 116 mg/dL — AB (ref 65–99)
Potassium: 3.3 mmol/L — ABNORMAL LOW (ref 3.5–5.1)
SODIUM: 142 mmol/L (ref 135–145)
Total Bilirubin: 0.8 mg/dL (ref 0.3–1.2)
Total Protein: 6.8 g/dL (ref 6.5–8.1)

## 2016-03-09 LAB — ABO/RH: ABO/RH(D): A POS

## 2016-03-09 LAB — APTT: aPTT: 25 seconds (ref 24–37)

## 2016-03-09 LAB — PROTIME-INR
INR: 1.03 (ref 0.00–1.49)
Prothrombin Time: 13.7 seconds (ref 11.6–15.2)

## 2016-03-09 NOTE — Progress Notes (Signed)
Ashley Mcdonald reports that she is having left hip replaced.  I called Horatio Pel at Dr Damita Dunnings office and asked her to verify and have Dr Mayer Camel put new consent in orders.

## 2016-03-09 NOTE — Pre-Procedure Instructions (Addendum)
    Ashley Mcdonald  03/09/2016     Your procedure is scheduled on Monday, April 10.  Report to [facility] Admitting at 9:30 A.M.               Your surgery or procedure is scheduled for 11:30 am   Call this number if you have problems the morning of surgery:[phone]                   For any other questions, please call [phone], Monday - Friday 8 AM - 4 PM.   Remember:  Do not eat food or drink liquids after midnight Sunday, April 9.  Take these medicines the morning of surgery with A SIP OF WATER : amLODipine (NORVASC).               Take if needed: acetaminophen (TYLENOL), HYDROcodone-acetaminophen or OxyCODONE.          Monday, April 3     Stop taking Aspirin, Coumadin, Plavix, Effient and Herbal medications.  Do not take any NSAIDs ie: Ibuprofen,  Advil,Naproxen or any medication containing Aspirin.   Do not wear jewelry, make-up or nail polish.  Do not wear lotions, powders, or perfumes.               Do not shave 48 hours prior to surgery.   Do not bring valuables to the hospital.  [facility] is not responsible for any belongings or valuables.  Contacts, dentures or bridgework may not be worn into surgery.  Leave your suitcase in the car.  After surgery it may be brought to your room.  For patients admitted to the hospital, discharge time will be determined by your treatment team.  Special instructions:  Review  [facility] - Preparing For Surgery.  Please read over the following fact sheets that you were given. Pain Booklet, Coughing and Deep Breathing, Blood Transfusion Information, MRSA Information and Surgical Site Infection Prevention, Incentive Spirometery

## 2016-03-18 NOTE — H&P (Signed)
TOTAL HIP ADMISSION H&P  Patient is admitted for left total hip arthroplasty.  Subjective:  Chief Complaint: left hip pain  HPI: Ashley Mcdonald, 65 y.o. female, has a history of pain and functional disability in the left hip(s) due to arthritis and patient has failed non-surgical conservative treatments for greater than 12 weeks to include NSAID's and/or analgesics, flexibility and strengthening excercises, weight reduction as appropriate and activity modification.  Onset of symptoms was gradual starting 2 years ago with gradually worsening course since that time.The patient noted no past surgery on the left hip(s).  Patient currently rates pain in the left hip at 10 out of 10 with activity. Patient has night pain, worsening of pain with activity and weight bearing, pain that interfers with activities of daily living and pain with passive range of motion. Patient has evidence of subchondral sclerosis and joint space narrowing by imaging studies. This condition presents safety issues increasing the risk of falls.   There is no current active infection.  There are no active problems to display for this patient.  Past Medical History  Diagnosis Date  . Hypertension   . Hyperlipidemia   . Cystic breast   . Plantar fasciitis 2006    right  . Puncture wound     right leg  . Norovirus   . Complication of anesthesia   . PONV (postoperative nausea and vomiting)   . Heart murmur     " nothing to be concerned about"  . Shortness of breath dyspnea     with exertion  . Arthritis     Past Surgical History  Procedure Laterality Date  . Hysteroscopy      D&C polypectomy  . Spinal fusion  2008    c5-7  . Colonoscopy      No prescriptions prior to admission   Allergies  Allergen Reactions  . Other     Anti-inflammatory causes diarrhea    Social History  Substance Use Topics  . Smoking status: Never Smoker   . Smokeless tobacco: Not on file  . Alcohol Use: No    Family History   Problem Relation Age of Onset  . Stroke Mother   . Heart attack Father   . Cancer Maternal Grandmother     uterine?  . Diabetes Paternal Grandmother      Review of Systems  Constitutional: Negative.   HENT: Negative.   Eyes: Negative.   Respiratory: Negative.   Cardiovascular: Negative.   Gastrointestinal: Negative.   Genitourinary: Negative.   Musculoskeletal: Positive for joint pain.  Skin: Negative.   Neurological: Negative.   Endo/Heme/Allergies: Negative.   Psychiatric/Behavioral: The patient has insomnia.     Objective:  Physical Exam  Vital signs in last 24 hours:    Labs:   Estimated body mass index is 29.63 kg/(m^2) as calculated from the following:   Height as of 10/24/15: 5' 2.75" (1.594 m).   Weight as of 10/24/15: 75.297 kg (166 lb).   Imaging Review Plain radiographs demonstrate increased valgus of both femoral necks, and mild developmental hip dysplasia.    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   The patient history, physical examination, clinical judgement of the provider and imaging studies are consistent with end stage degenerative joint disease of the left hip(s) and total hip arthroplasty is deemed medically necessary. The treatment options including medical management, injection therapy, arthroscopy and arthroplasty were discussed at length. The risks and benefits of total  hip arthroplasty were presented and reviewed. The risks due to aseptic loosening, infection, stiffness, dislocation/subluxation,  thromboembolic complications and other imponderables were discussed.  The patient acknowledged the explanation, agreed to proceed with the plan and consent was signed. Patient is being admitted for inpatient treatment for surgery, pain control, PT, OT, prophylactic antibiotics, VTE prophylaxis, progressive ambulation and ADL's and discharge planning.The patient is planning to  be discharged home with home health services

## 2016-03-19 MED ORDER — DEXTROSE-NACL 5-0.45 % IV SOLN: INTRAVENOUS | Status: DC

## 2016-03-19 MED ORDER — CEFAZOLIN SODIUM-DEXTROSE 2-4 GM/100ML-% IV SOLN
2.0000 g | INTRAVENOUS | Status: AC
  Administered 2016-03-22: 2 g via INTRAVENOUS
  Filled 2016-03-19: qty 100

## 2016-03-20 DIAGNOSIS — M1612 Unilateral primary osteoarthritis, left hip: Secondary | ICD-10-CM | POA: Diagnosis present

## 2016-03-20 HISTORY — DX: Unilateral primary osteoarthritis, left hip: M16.12

## 2016-03-21 MED ORDER — SODIUM CHLORIDE 0.9 % IV SOLN
1000.0000 mg | INTRAVENOUS | Status: DC
  Filled 2016-03-21: qty 10

## 2016-03-21 MED ORDER — TRANEXAMIC ACID 1000 MG/10ML IV SOLN
1000.0000 mg | INTRAVENOUS | Status: AC
  Administered 2016-03-22: 1000 mg via INTRAVENOUS
  Filled 2016-03-21 (×2): qty 10

## 2016-03-22 ENCOUNTER — Encounter (HOSPITAL_COMMUNITY): Payer: Self-pay | Admitting: General Practice

## 2016-03-22 ENCOUNTER — Inpatient Hospital Stay (HOSPITAL_COMMUNITY): Payer: BLUE CROSS/BLUE SHIELD | Admitting: Anesthesiology

## 2016-03-22 ENCOUNTER — Encounter (HOSPITAL_COMMUNITY)
Admission: RE | Disposition: A | Discharge status: Home / Self Care | Payer: Self-pay | Source: Ambulatory Visit | Attending: Orthopedic Surgery

## 2016-03-22 ENCOUNTER — Inpatient Hospital Stay (HOSPITAL_COMMUNITY): Payer: BLUE CROSS/BLUE SHIELD

## 2016-03-22 ENCOUNTER — Inpatient Hospital Stay (HOSPITAL_COMMUNITY)
Admission: RE | Admit: 2016-03-22 | Discharge: 2016-03-24 | DRG: 470 | Disposition: A | Discharge status: Home / Self Care | Payer: BLUE CROSS/BLUE SHIELD | Source: Ambulatory Visit | Attending: Orthopedic Surgery | Admitting: Orthopedic Surgery

## 2016-03-22 DIAGNOSIS — M25552 Pain in left hip: Secondary | ICD-10-CM | POA: Diagnosis present

## 2016-03-22 DIAGNOSIS — E785 Hyperlipidemia, unspecified: Secondary | ICD-10-CM | POA: Diagnosis present

## 2016-03-22 DIAGNOSIS — Q6589 Other specified congenital deformities of hip: Secondary | ICD-10-CM | POA: Diagnosis not present

## 2016-03-22 DIAGNOSIS — D62 Acute posthemorrhagic anemia: Secondary | ICD-10-CM | POA: Diagnosis not present

## 2016-03-22 DIAGNOSIS — Z981 Arthrodesis status: Secondary | ICD-10-CM

## 2016-03-22 DIAGNOSIS — Z419 Encounter for procedure for purposes other than remedying health state, unspecified: Secondary | ICD-10-CM

## 2016-03-22 DIAGNOSIS — M1612 Unilateral primary osteoarthritis, left hip: Secondary | ICD-10-CM

## 2016-03-22 DIAGNOSIS — I10 Essential (primary) hypertension: Secondary | ICD-10-CM | POA: Diagnosis present

## 2016-03-22 HISTORY — PX: TOTAL HIP ARTHROPLASTY: SHX124

## 2016-03-22 HISTORY — DX: Unilateral primary osteoarthritis, left hip: M16.12

## 2016-03-22 SURGERY — ARTHROPLASTY, HIP, TOTAL, ANTERIOR APPROACH
Anesthesia: Monitor Anesthesia Care | Site: Hip | Laterality: Left

## 2016-03-22 MED ORDER — METOCLOPRAMIDE HCL 5 MG/ML IJ SOLN
5.0000 mg | Freq: Three times a day (TID) | INTRAMUSCULAR | Status: DC | PRN
  Filled 2016-03-22: qty 2

## 2016-03-22 MED ORDER — HYDROMORPHONE HCL 1 MG/ML IJ SOLN
INTRAMUSCULAR | Status: AC
  Filled 2016-03-22: qty 1

## 2016-03-22 MED ORDER — DIPHENHYDRAMINE HCL 12.5 MG/5ML PO ELIX: 12.5000 mg | ORAL_SOLUTION | ORAL | Status: DC | PRN

## 2016-03-22 MED ORDER — FENTANYL CITRATE (PF) 100 MCG/2ML IJ SOLN
INTRAMUSCULAR | Status: DC | PRN
  Administered 2016-03-22 (×5): 50 ug via INTRAVENOUS

## 2016-03-22 MED ORDER — ACETAMINOPHEN 650 MG RE SUPP: 650.0000 mg | Freq: Four times a day (QID) | RECTAL | Status: DC | PRN

## 2016-03-22 MED ORDER — KCL IN DEXTROSE-NACL 20-5-0.45 MEQ/L-%-% IV SOLN
INTRAVENOUS | Status: DC
  Administered 2016-03-23: 02:00:00 via INTRAVENOUS
  Filled 2016-03-22: qty 1000

## 2016-03-22 MED ORDER — ONDANSETRON HCL 4 MG/2ML IJ SOLN
4.0000 mg | Freq: Once | INTRAMUSCULAR | Status: DC | PRN
Start: 2016-03-22 — End: 2016-03-22

## 2016-03-22 MED ORDER — METOCLOPRAMIDE HCL 5 MG PO TABS
5.0000 mg | ORAL_TABLET | Freq: Three times a day (TID) | ORAL | Status: DC | PRN
  Administered 2016-03-23 – 2016-03-24 (×2): 10 mg via ORAL
  Filled 2016-03-22 (×2): qty 2

## 2016-03-22 MED ORDER — 0.9 % SODIUM CHLORIDE (POUR BTL) OPTIME
TOPICAL | Status: DC | PRN
  Administered 2016-03-22: 1000 mL

## 2016-03-22 MED ORDER — ONDANSETRON HCL 4 MG/2ML IJ SOLN
INTRAMUSCULAR | Status: DC | PRN
  Administered 2016-03-22: 4 mg via INTRAVENOUS

## 2016-03-22 MED ORDER — PROPOFOL 500 MG/50ML IV EMUL
INTRAVENOUS | Status: DC | PRN
  Administered 2016-03-22: 10 ug/kg/min via INTRAVENOUS

## 2016-03-22 MED ORDER — OXYCODONE HCL 5 MG PO TABS
5.0000 mg | ORAL_TABLET | ORAL | Status: DC | PRN
  Administered 2016-03-22 – 2016-03-23 (×6): 10 mg via ORAL
  Administered 2016-03-23: 5 mg via ORAL
  Administered 2016-03-23 – 2016-03-24 (×3): 10 mg via ORAL
  Filled 2016-03-22 (×11): qty 2

## 2016-03-22 MED ORDER — FLEET ENEMA 7-19 GM/118ML RE ENEM: 1.0000 | ENEMA | Freq: Once | RECTAL | Status: DC | PRN

## 2016-03-22 MED ORDER — MIDAZOLAM HCL 2 MG/2ML IJ SOLN
INTRAMUSCULAR | Status: AC
  Filled 2016-03-22: qty 2

## 2016-03-22 MED ORDER — PHENOL 1.4 % MT LIQD: 1.0000 | OROMUCOSAL | Status: DC | PRN

## 2016-03-22 MED ORDER — HYDROCHLOROTHIAZIDE 12.5 MG PO CAPS
12.5000 mg | ORAL_CAPSULE | Freq: Every day | ORAL | Status: DC
  Administered 2016-03-23: 12.5 mg via ORAL
  Filled 2016-03-22 (×2): qty 1

## 2016-03-22 MED ORDER — TRANEXAMIC ACID 1000 MG/10ML IV SOLN
2000.0000 mg | Freq: Once | INTRAVENOUS | Status: AC
  Administered 2016-03-22: 2000 mg via TOPICAL
  Filled 2016-03-22: qty 20

## 2016-03-22 MED ORDER — MIDAZOLAM HCL 5 MG/5ML IJ SOLN
INTRAMUSCULAR | Status: DC | PRN
  Administered 2016-03-22 (×2): 1 mg via INTRAVENOUS

## 2016-03-22 MED ORDER — METHOCARBAMOL 1000 MG/10ML IJ SOLN
500.0000 mg | INTRAVENOUS | Status: AC
  Administered 2016-03-22: 500 mg via INTRAVENOUS
  Filled 2016-03-22: qty 5

## 2016-03-22 MED ORDER — BISACODYL 5 MG PO TBEC: 5.0000 mg | DELAYED_RELEASE_TABLET | Freq: Every day | ORAL | Status: DC | PRN

## 2016-03-22 MED ORDER — IRBESARTAN 150 MG PO TABS
150.0000 mg | ORAL_TABLET | Freq: Every day | ORAL | Status: DC
  Administered 2016-03-23: 150 mg via ORAL
  Filled 2016-03-22 (×2): qty 1

## 2016-03-22 MED ORDER — ALUMINUM HYDROXIDE GEL 320 MG/5ML PO SUSP: 15.0000 mL | ORAL | Status: DC | PRN

## 2016-03-22 MED ORDER — DOCUSATE SODIUM 100 MG PO CAPS
100.0000 mg | ORAL_CAPSULE | Freq: Two times a day (BID) | ORAL | Status: DC
  Administered 2016-03-22 – 2016-03-24 (×4): 100 mg via ORAL
  Filled 2016-03-22 (×4): qty 1

## 2016-03-22 MED ORDER — SODIUM CHLORIDE 0.9 % IV SOLN
10.0000 mg | INTRAVENOUS | Status: DC | PRN
  Administered 2016-03-22: 20 ug/min via INTRAVENOUS

## 2016-03-22 MED ORDER — ONDANSETRON HCL 4 MG/2ML IJ SOLN
4.0000 mg | Freq: Four times a day (QID) | INTRAMUSCULAR | Status: DC | PRN
  Administered 2016-03-23: 4 mg via INTRAVENOUS
  Filled 2016-03-22: qty 2

## 2016-03-22 MED ORDER — TELMISARTAN-HCTZ 40-12.5 MG PO TABS: 1.0000 | ORAL_TABLET | Freq: Every day | ORAL | Status: DC

## 2016-03-22 MED ORDER — HYDROMORPHONE HCL 1 MG/ML IJ SOLN
1.0000 mg | INTRAMUSCULAR | Status: DC | PRN
  Administered 2016-03-22 – 2016-03-23 (×2): 1 mg via INTRAVENOUS
  Filled 2016-03-22 (×2): qty 1

## 2016-03-22 MED ORDER — AMLODIPINE BESYLATE 5 MG PO TABS
5.0000 mg | ORAL_TABLET | Freq: Every day | ORAL | Status: DC
  Administered 2016-03-23: 5 mg via ORAL
  Filled 2016-03-22 (×2): qty 1

## 2016-03-22 MED ORDER — DEXTROSE 5 % IV SOLN
INTRAVENOUS | Status: DC | PRN
  Administered 2016-03-22: 11:00:00 via INTRAVENOUS

## 2016-03-22 MED ORDER — MENTHOL 3 MG MT LOZG: 1.0000 | LOZENGE | OROMUCOSAL | Status: DC | PRN

## 2016-03-22 MED ORDER — ARTIFICIAL TEARS OP OINT
TOPICAL_OINTMENT | OPHTHALMIC | Status: AC
  Filled 2016-03-22: qty 3.5

## 2016-03-22 MED ORDER — HYDROMORPHONE HCL 1 MG/ML IJ SOLN
0.2500 mg | INTRAMUSCULAR | Status: DC | PRN
  Administered 2016-03-22 (×4): 0.5 mg via INTRAVENOUS

## 2016-03-22 MED ORDER — LACTATED RINGERS IV SOLN
INTRAVENOUS | Status: DC
  Administered 2016-03-22: 10:00:00 via INTRAVENOUS

## 2016-03-22 MED ORDER — OXYCODONE HCL 5 MG PO TABS
5.0000 mg | ORAL_TABLET | Freq: Once | ORAL | Status: AC | PRN
  Administered 2016-03-22: 5 mg via ORAL

## 2016-03-22 MED ORDER — OXYCODONE HCL 5 MG PO TABS
ORAL_TABLET | ORAL | Status: AC
Start: 2016-03-22 — End: 2016-03-23
  Filled 2016-03-22: qty 1

## 2016-03-22 MED ORDER — LIDOCAINE HCL (CARDIAC) 20 MG/ML IV SOLN
INTRAVENOUS | Status: DC | PRN
  Administered 2016-03-22: 50 mg via INTRATRACHEAL

## 2016-03-22 MED ORDER — ASPIRIN EC 325 MG PO TBEC: 325.0000 mg | DELAYED_RELEASE_TABLET | Freq: Two times a day (BID) | ORAL | Status: DC

## 2016-03-22 MED ORDER — POLYETHYLENE GLYCOL 3350 17 G PO PACK
17.0000 g | PACK | Freq: Every day | ORAL | Status: DC | PRN
Start: 2016-03-22 — End: 2016-03-24

## 2016-03-22 MED ORDER — ASPIRIN EC 325 MG PO TBEC
325.0000 mg | DELAYED_RELEASE_TABLET | Freq: Every day | ORAL | Status: DC
  Administered 2016-03-23 – 2016-03-24 (×2): 325 mg via ORAL
  Filled 2016-03-22 (×2): qty 1

## 2016-03-22 MED ORDER — DEXAMETHASONE SODIUM PHOSPHATE 10 MG/ML IJ SOLN
10.0000 mg | Freq: Once | INTRAMUSCULAR | Status: AC
  Administered 2016-03-23: 10 mg via INTRAVENOUS
  Filled 2016-03-22: qty 1

## 2016-03-22 MED ORDER — ACETAMINOPHEN 325 MG PO TABS
650.0000 mg | ORAL_TABLET | Freq: Four times a day (QID) | ORAL | Status: DC | PRN
  Administered 2016-03-22: 650 mg via ORAL
  Filled 2016-03-22: qty 2

## 2016-03-22 MED ORDER — LACTATED RINGERS IV SOLN
INTRAVENOUS | Status: DC | PRN
  Administered 2016-03-22: 10:00:00 via INTRAVENOUS

## 2016-03-22 MED ORDER — OXYCODONE-ACETAMINOPHEN 5-325 MG PO TABS: 1.0000 | ORAL_TABLET | ORAL | Status: DC | PRN

## 2016-03-22 MED ORDER — METHOCARBAMOL 500 MG PO TABS
500.0000 mg | ORAL_TABLET | Freq: Four times a day (QID) | ORAL | Status: DC | PRN
  Administered 2016-03-22 – 2016-03-24 (×6): 500 mg via ORAL
  Filled 2016-03-22 (×6): qty 1

## 2016-03-22 MED ORDER — ONDANSETRON HCL 4 MG PO TABS: 4.0000 mg | ORAL_TABLET | Freq: Four times a day (QID) | ORAL | Status: DC | PRN

## 2016-03-22 MED ORDER — LIDOCAINE HCL (CARDIAC) 20 MG/ML IV SOLN
INTRAVENOUS | Status: AC
  Filled 2016-03-22: qty 5

## 2016-03-22 MED ORDER — OXYCODONE HCL 5 MG/5ML PO SOLN: 5.0000 mg | Freq: Once | ORAL | Status: AC | PRN

## 2016-03-22 MED ORDER — FENTANYL CITRATE (PF) 250 MCG/5ML IJ SOLN
INTRAMUSCULAR | Status: AC
  Filled 2016-03-22: qty 5

## 2016-03-22 MED ORDER — METHOCARBAMOL 1000 MG/10ML IJ SOLN: 500.0000 mg | Freq: Four times a day (QID) | INTRAVENOUS | Status: DC | PRN

## 2016-03-22 MED ORDER — POVIDONE-IODINE 7.5 % EX SOLN
Freq: Once | CUTANEOUS | Status: DC
  Filled 2016-03-22: qty 118

## 2016-03-22 MED ORDER — PROPOFOL 10 MG/ML IV BOLUS
INTRAVENOUS | Status: DC | PRN
  Administered 2016-03-22: 30 mg via INTRAVENOUS

## 2016-03-22 SURGICAL SUPPLY — 46 items
BLADE SURG ROTATE 9660 (MISCELLANEOUS) IMPLANT
CAPT HIP TOTAL 2 ×2 IMPLANT
COVER PERINEAL POST (MISCELLANEOUS) ×3 IMPLANT
COVER SURGICAL LIGHT HANDLE (MISCELLANEOUS) ×3 IMPLANT
DRAPE C-ARM 42X72 X-RAY (DRAPES) ×3 IMPLANT
DRAPE STERI IOBAN 125X83 (DRAPES) ×3 IMPLANT
DRAPE U-SHAPE 47X51 STRL (DRAPES) ×6 IMPLANT
DRSG AQUACEL AG ADV 3.5X 4 (GAUZE/BANDAGES/DRESSINGS) ×2 IMPLANT
DRSG AQUACEL AG ADV 3.5X10 (GAUZE/BANDAGES/DRESSINGS) ×3 IMPLANT
DURAPREP 26ML APPLICATOR (WOUND CARE) ×3 IMPLANT
ELECT BLADE 4.0 EZ CLEAN MEGAD (MISCELLANEOUS) ×3
ELECT REM PT RETURN 9FT ADLT (ELECTROSURGICAL) ×3
ELECTRODE BLDE 4.0 EZ CLN MEGD (MISCELLANEOUS) ×1 IMPLANT
ELECTRODE REM PT RTRN 9FT ADLT (ELECTROSURGICAL) ×1 IMPLANT
FACESHIELD WRAPAROUND (MASK) ×6 IMPLANT
FACESHIELD WRAPAROUND OR TEAM (MASK) ×2 IMPLANT
GLOVE BIO SURGEON STRL SZ7.5 (GLOVE) ×3 IMPLANT
GLOVE BIO SURGEON STRL SZ8.5 (GLOVE) ×6 IMPLANT
GLOVE BIOGEL PI IND STRL 8 (GLOVE) ×2 IMPLANT
GLOVE BIOGEL PI IND STRL 9 (GLOVE) ×1 IMPLANT
GLOVE BIOGEL PI INDICATOR 8 (GLOVE) ×4
GLOVE BIOGEL PI INDICATOR 9 (GLOVE) ×2
GOWN STRL REUS W/ TWL LRG LVL3 (GOWN DISPOSABLE) ×1 IMPLANT
GOWN STRL REUS W/ TWL XL LVL3 (GOWN DISPOSABLE) ×2 IMPLANT
GOWN STRL REUS W/TWL LRG LVL3 (GOWN DISPOSABLE) ×3
GOWN STRL REUS W/TWL XL LVL3 (GOWN DISPOSABLE) ×6
KIT BASIN OR (CUSTOM PROCEDURE TRAY) ×3 IMPLANT
KIT ROOM TURNOVER OR (KITS) ×3 IMPLANT
MANIFOLD NEPTUNE II (INSTRUMENTS) ×3 IMPLANT
NS IRRIG 1000ML POUR BTL (IV SOLUTION) ×3 IMPLANT
PACK TOTAL JOINT (CUSTOM PROCEDURE TRAY) ×3 IMPLANT
PAD ARMBOARD 7.5X6 YLW CONV (MISCELLANEOUS) ×6 IMPLANT
PENCIL BUTTON HOLSTER BLD 10FT (ELECTRODE) ×2 IMPLANT
SAW OSC TIP CART 19.5X105X1.3 (SAW) ×3 IMPLANT
SUT ETHIBOND NAB CT1 #1 30IN (SUTURE) ×6 IMPLANT
SUT VIC AB 0 CTX 36 (SUTURE) ×3
SUT VIC AB 0 CTX36XBRD ANTBCTR (SUTURE) ×1 IMPLANT
SUT VIC AB 1 CTX 36 (SUTURE) ×3
SUT VIC AB 1 CTX36XBRD ANBCTR (SUTURE) ×1 IMPLANT
SUT VIC AB 2-0 CTX 27 (SUTURE) ×3 IMPLANT
SUT VIC AB 3-0 PS2 18 (SUTURE) ×3
SUT VIC AB 3-0 PS2 18XBRD (SUTURE) ×1 IMPLANT
TOWEL OR 17X24 6PK STRL BLUE (TOWEL DISPOSABLE) ×3 IMPLANT
TOWEL OR 17X26 10 PK STRL BLUE (TOWEL DISPOSABLE) ×3 IMPLANT
TRAY FOLEY CATH 14FR (SET/KITS/TRAYS/PACK) IMPLANT
WATER STERILE IRR 1000ML POUR (IV SOLUTION) ×1 IMPLANT

## 2016-03-22 NOTE — Transfer of Care (Signed)
Immediate Anesthesia Transfer of Care Note  Patient: Ashley Mcdonald  Procedure(s) Performed: Procedure(s): TOTAL HIP ARTHROPLASTY ANTERIOR APPROACH (Left)  Patient Location: PACU  Anesthesia Type:Spinal  Level of Consciousness: awake, oriented, sedated, patient cooperative and responds to stimulation  Airway & Oxygen Therapy: Patient Spontanous Breathing and Patient connected to nasal cannula oxygen  Post-op Assessment: Report given to RN and Post -op Vital signs reviewed and stable  Post vital signs: Reviewed and stable  Last Vitals:  Filed Vitals:   03/22/16 0914  BP: 141/53  Pulse: 76  Temp: 36.7 C  Resp: 20    Complications: No apparent anesthesia complications

## 2016-03-22 NOTE — Discharge Instructions (Signed)

## 2016-03-22 NOTE — Anesthesia Postprocedure Evaluation (Signed)
Anesthesia Post Note  Patient: Ashley Mcdonald  Procedure(s) Performed: Procedure(s) (LRB): TOTAL HIP ARTHROPLASTY ANTERIOR APPROACH (Left)  Patient location during evaluation: PACU Anesthesia Type: MAC and Spinal Level of consciousness: awake, awake and alert and oriented Pain management: pain level controlled Vital Signs Assessment: post-procedure vital signs reviewed and stable Respiratory status: spontaneous breathing, nonlabored ventilation and respiratory function stable Postop Assessment: spinal receding Anesthetic complications: no    Last Vitals:  Filed Vitals:   03/22/16 1445 03/22/16 1500  BP: 113/57 111/57  Pulse: 79 77  Temp:  36.7 C  Resp: 17 21    Last Pain:  Filed Vitals:   03/22/16 1733  PainSc: Asleep                 Eashan Schipani COKER

## 2016-03-22 NOTE — Anesthesia Preprocedure Evaluation (Signed)
Anesthesia Evaluation  Patient identified by MRN, date of birth, ID band Patient awake    Reviewed: Allergy & Precautions, NPO status , Patient's Chart, lab work & pertinent test results  Airway Mallampati: II  TM Distance: >3 FB Neck ROM: Full    Dental  (+) Teeth Intact, Dental Advisory Given   Pulmonary    breath sounds clear to auscultation       Cardiovascular hypertension,  Rhythm:Regular Rate:Normal     Neuro/Psych    GI/Hepatic   Endo/Other    Renal/GU      Musculoskeletal   Abdominal   Peds  Hematology   Anesthesia Other Findings   Reproductive/Obstetrics                             Anesthesia Physical Anesthesia Plan  ASA: II  Anesthesia Plan: MAC and Spinal   Post-op Pain Management:    Induction: Intravenous  Airway Management Planned: Natural Airway and Simple Face Mask  Additional Equipment:   Intra-op Plan:   Post-operative Plan:   Informed Consent: I have reviewed the patients History and Physical, chart, labs and discussed the procedure including the risks, benefits and alternatives for the proposed anesthesia with the patient or authorized representative who has indicated his/her understanding and acceptance.     Plan Discussed with: CRNA and Anesthesiologist  Anesthesia Plan Comments: (Plan SAB)        Anesthesia Quick Evaluation

## 2016-03-22 NOTE — Anesthesia Procedure Notes (Addendum)
Spinal  Start time: 03/22/2016 11:05 AM End time: 03/22/2016 11:10 AM Staffing Performed by: anesthesiologist  Preanesthetic Checklist Completed: patient identified, site marked, surgical consent, pre-op evaluation, timeout performed, IV checked, risks and benefits discussed and monitors and equipment checked Spinal Block Patient position: left lateral decubitus Prep: Betadine Patient monitoring: heart rate, cardiac monitor, continuous pulse ox and blood pressure Approach: midline Location: L3-4 Injection technique: single-shot Needle Needle type: Tuohy  Needle gauge: 22 G Needle length: 9 cm Assessment Sensory level: T8 Additional Notes 10 mg 0.75% Bupivacaine injected easily  Procedure Name: MAC Date/Time: 03/22/2016 11:03 AM Performed by: Jacquiline Doe A Pre-anesthesia Checklist: Patient identified, Timeout performed, Emergency Drugs available, Suction available and Patient being monitored Patient Re-evaluated:Patient Re-evaluated prior to inductionOxygen Delivery Method: Nasal cannula Intubation Type: IV induction Placement Confirmation: breath sounds checked- equal and bilateral and positive ETCO2 Dental Injury: Teeth and Oropharynx as per pre-operative assessment

## 2016-03-22 NOTE — Op Note (Signed)
OPERATIVE REPORT    DATE OF PROCEDURE:  03/22/2016       PREOPERATIVE DIAGNOSIS:  LEFT HIP OSTEOARTHRITIS                                                           POSTOPERATIVE DIAGNOSIS:  LEFT HIP OSTEOARTHRITIS                                                            PROCEDURE: Anterior L total hip arthroplasty using a 50 mm DePuy Pinnacle  Cup, [employer], 0-degree polyethylene liner, a +5 32 mm ceramic head, a #3 Depuy Triloc stem   SURGEON: Demaurion Dicioccio J    ASSISTANT:   [employer]  (present throughout entire procedure and necessary for timely completion of the procedure)   ANESTHESIA: Spinal BLOOD LOSS: 300 FLUID REPLACEMENT: 1600 crystalloid Antibiotic: 2gm ancef Tranexamic Acid: 1gm iv COMPLICATIONS: none    INDICATIONS FOR PROCEDURE: A 65 y.o. year-old With  LEFT HIP OSTEOARTHRITIS    for 3 years, x-rays show bone-on-bone arthritic changes, and osteophytes. Despite conservative measures with observation, anti-inflammatory medicine, narcotics, use of a cane, has severe unremitting pain and can ambulate only a few blocks before resting. Patient desires elective L total hip arthroplasty to decrease pain and increase function. The risks, benefits, and alternatives were discussed at length including but not limited to the risks of infection, bleeding, nerve injury, stiffness, blood clots, the need for revision surgery, cardiopulmonary complications, among others, and they were willing to proceed. Questions answered     PROCEDURE IN DETAIL: The patient was identified by armband,  received preoperative IV antibiotics in the holding area at [facility], taken to the operating room , appropriate anesthetic monitors  were attached and  anesthesia was induced with the patienton the gurney. The HANA boots were applied to the feet and he was then transferred to the HANA table with a peroneal post and support underneath the non-operative le, which was  locked in 5 lb traction. Theoperative lower extremity was then prepped and draped in the usual sterile fashion from just above the iliac crest to the knee. And a timeout procedure was performed. We then made a 12 cm incision along the interval at the leading edge of the tensor fascia lata of starting at 2 cm lateral to and 2 cm distal to the ASIS. Small bleeders in the skin and subcutaneous tissue identified and cauterized we dissected down to the fascia and made an incision in the fascia allowing [place] to elevate the fascia of the tensor muscle and exploited the interval between the rectus and the tensor fascia lata. A Hohmann retractor was then placed along the superior neck of the femur and a Cobra retractor along the inferior neck of the femur we teed the capsule starting out at the superior anterior aspect of the acetabulum going distally and made the T along the neck both leaflets of the T were tagged with #2 Ethibond suture. Cobra retractors were then placed along the inferior and superior neck allowing [place] to perform a standard neck cut and  removed the femoral head with a power corkscrew. We then placed a right angle Hohmann retractor along the anterior aspect of the acetabulum a spiked Cobra in the cotyloid notch and posteriorly a Muelller retractor. We then sequentially reamed up to a 49 mm basket reamer obtaining good coverage in all quadrants, verified by C-arm imaging. Under C-arm control with and hammered into place a 54 mm Pinnacle cup in 45 of abduction and 15 of anteversion. The cup seated nicely and required no supplemental screws. We then placed a central hole Eliminator and a 0 polyethylene liner. The foot was then externally rotated to 100, the HANA elevator was placed around the flare of the greater trochanter and the limb was extended and abducted delivering the proximal femur up into the wound. A medium Hohmann retractor was placed over the greater trochanter and a Mueller retractor along the  posterior femoral neck completing the exposure. We then performed releases superiorly and and inferiorly of the capsule going back to the pirformis fossa superiorly and to the lesser trochanter inferiorly. We then entered the proximal femur with the box cutting offset chisel followed by, a canal sounder, the chili pepper and broaching up to a 3 broach. This seated nicely and we reamed the calcar. A trial reduction was performed with a 5 mm 32 mm head.The limb lengths were excellent the hip was stable in 90 of external rotation. At this point the trial components removed and we hammered into place a # 3  Tri-Lock stem with G[employer] coating. This was a std offset stem and a + 5 32 mm ceramic ball was then hammered into place the hip was reduced and final C-arm images obtained. The wound was thoroughly irrigated with normal saline solution. We repaired the ant capsule and the tensor fascia lot a with running 0 vicryl suture. the subcutaneous tissue was closed with 2-0 and 3-0 Vicryl suture followed by an Aquacil dressing. At this point the patient was awaken and transferred to hospital gurney without difficulty. The subcutaneous tissue with 0 and 2-0 undyed Vicryl suture and the skin with running  3-0 vicryl subcuticular suture. Aquacil dressing was applied. The patient was then unclamped, rolled supine, awaken extubated and taken to recovery room without difficulty in stable condition.   Kerin Salen 03/22/2016, 12:34 PM

## 2016-03-23 ENCOUNTER — Encounter (HOSPITAL_COMMUNITY): Payer: Self-pay | Admitting: Orthopedic Surgery

## 2016-03-23 LAB — BASIC METABOLIC PANEL
Anion gap: 8 (ref 5–15)
BUN: 8 mg/dL (ref 6–20)
CHLORIDE: 101 mmol/L (ref 101–111)
CO2: 27 mmol/L (ref 22–32)
CREATININE: 0.84 mg/dL (ref 0.44–1.00)
Calcium: 8.5 mg/dL — ABNORMAL LOW (ref 8.9–10.3)
GFR calc Af Amer: >60 mL/min (ref >60)
GFR calc non Af Amer: >60 mL/min (ref >60)
GLUCOSE: 127 mg/dL — AB (ref 65–99)
Potassium: 4 mmol/L (ref 3.5–5.1)
SODIUM: 136 mmol/L (ref 135–145)

## 2016-03-23 LAB — CBC
HCT: 33.7 % — ABNORMAL LOW (ref 36.0–46.0)
HEMOGLOBIN: 10.9 g/dL — AB (ref 12.0–15.0)
MCH: 27.3 pg (ref 26.0–34.0)
MCHC: 32.3 g/dL (ref 30.0–36.0)
MCV: 84.3 fL (ref 78.0–100.0)
Platelets: 148 10*3/uL — ABNORMAL LOW (ref 150–400)
RBC: 4 MIL/uL (ref 3.87–5.11)
RDW: 13 % (ref 11.5–15.5)
WBC: 7.1 10*3/uL (ref 4.0–10.5)

## 2016-03-23 NOTE — Evaluation (Signed)
Occupational Therapy Evaluation Patient Details Name: Ashley Mcdonald MRN: [id number] DOB: 1951/04/03 Today's Date: 03/23/2016    History of Present Illness pt admitted for left direct anterior THA with PMHx of HTn, hyperlipidemia, heart murmur, and dyspnea with exertion   Clinical Impression   This 65 yo female admitted and underwent above presents to acute OT with deficits below (see OT problem list) affecting her PLOF of Mod I. She will benefit from acute OT without need for follow up.   Follow Up Recommendations  No OT follow up    Equipment Recommendations  3 in 1 bedside comode       Precautions / Restrictions Precautions Precautions: Fall Restrictions Weight Bearing Restrictions: No LLE Weight Bearing: Weight bearing as tolerated      Mobility Bed Mobility     General bed mobility comments: Pt up in recliner upon arrival  Transfers Overall transfer level: Needs assistance Equipment used: Rolling walker (2 wheeled) Transfers: Sit to/from [facility] Sit to Stand: Min guard Stand pivot transfers: Min guard          Balance Overall balance assessment: Needs assistance   Sitting balance-Leahy Scale: Good       Standing balance-Leahy Scale: Fair                              ADL Overall ADL's : Needs assistance/impaired Eating/Feeding: Independent;Sitting   Grooming: Set up;Sitting   Upper Body Bathing: Set up;Sitting   Lower Body Bathing: Maximal assistance (min guard sit<>stand)   Upper Body Dressing : Set up;Sitting   Lower Body Dressing:  (min guard sit<>stand)   Toilet Transfer: Minimal assistance;Stand-pivot;RW (min guard sit<>stand)   Toileting- Clothing Manipulation and Hygiene: Moderate assistance (min guard sit<>stand)                         Pertinent Vitals/Pain Pain Assessment: 0-10 Pain Score: 7  Pain Location: left hip Pain Descriptors / Indicators: Aching;Sore;Tightness Pain  Intervention(s): Monitored during session;Repositioned;Patient requesting pain meds-RN notified (RN in to give muscle relaxor and pt can have po pain meds at 12:15)     Hand Dominance Right   Extremity/Trunk Assessment Upper Extremity Assessment Upper Extremity Assessment: Generalized weakness       Communication Communication Communication: No difficulties   Cognition Arousal/Alertness: Lethargic Behavior During Therapy: WFL for tasks assessed/performed Overall Cognitive Status: Within Functional Limits for tasks assessed                                Home Living Family/patient expects to be discharged to:: Private residence Living Arrangements: Spouse/significant other Available Help at Discharge: Family;Available 24 hours/day (at least until weekend is over; longer if needed) Type of Home: House Home Access: Stairs to enter [employer] of Steps: 3   Home Layout: Multi-level;Able to live on main level with bedroom/bathroom Alternate Level Stairs-Number of Steps: 10   Bathroom Shower/Tub: Occupational psychologist: Standard     Home Equipment: None          Prior Functioning/Environment Level of Independence: Independent             OT Diagnosis: Generalized weakness;Acute pain   OT Problem List: Decreased strength;Decreased range of motion;Impaired balance (sitting and/or standing);Pain;Decreased knowledge of use of DME or AE   OT Treatment/Interventions: Self-care/ADL training;Patient/family education;Balance training;Therapeutic activities;DME and/or AE  instruction    OT Goals(Current goals can be found in the care plan section) Acute Rehab OT Goals Patient Stated Goal: be able to ride her horse OT Goal Formulation: With patient Time For Goal Achievement: 03/30/16 Potential to Achieve Goals: Good  OT Frequency: Min 2X/week              End of Session Equipment Utilized During Treatment: Gait belt;Rolling  walker Nurse Communication: Patient requests pain meds  Activity Tolerance: Patient limited by pain Patient left:  (on BSC with PT to get her off)   Time: 1136-1150 OT Time Calculation (min): 14 min Charges:  OT General Charges $OT Visit: 1 Procedure OT Evaluation $OT Eval Moderate Complexity: 1 Procedure   Almon Register [id number] 03/23/2016, 12:03 PM

## 2016-03-23 NOTE — Progress Notes (Signed)
Utilization review completed.  

## 2016-03-23 NOTE — Evaluation (Signed)
Physical Therapy Evaluation Patient Details Name: Ashley Mcdonald MRN: [id number] DOB: [birth year] Today's Date: 03/23/2016   History of Present Illness  pt admitted for left THA with PMHx of HTn, hyperlipidemia, heart murmur, and dyspnea with exertion  Clinical Impression  Pt pleasant upon arrival. Spouse present and helped encourage pt throughout session. Pt reported having to urinate, but was unable to after attempting to during session. Pt tearful upon moving her left leg, and was hesitant to let therapist touch her leg due to pain. Pt limited movement of her left leg due to pain. Pt was medicated during session and became nauseous during session. Pt has deficits in strength, ROM, and functional mobility in her leg. Pt would benefit from acute therapy to increase her strength, ROM, and functional mobility in order to increase independence and decrease caregiver burden. Encouraged pt to stay up in chair for at least one hour. Encouraged mobility and exercises and provided pt with a handout for HEP. Follow up recommendation and equipment listed below.    Follow Up Recommendations Home health PT    Equipment Recommendations  Rolling walker with 5" wheels;3in1 (PT)    Recommendations for Other Services       Precautions / Restrictions Precautions Precautions: None Restrictions Weight Bearing Restrictions: Yes LLE Weight Bearing: Weight bearing as tolerated      Mobility  Bed Mobility Overal bed mobility: Needs Assistance Bed Mobility: Supine to Sit     Supine to sit: Min assist     General bed mobility comments: increased time needed due to pain. cues to hook RLE under LLE to move across bed, push off bed with RLE to raise and shift hips across bed, push off bed with UE to lift trunk. min assist for moving LLE across bed and controlling descent off bed.  Transfers Overall transfer level: Needs assistance Equipment used: Rolling walker (2 wheeled) Transfers: Sit to/from  [facility] Sit to Stand: Min guard Stand pivot transfers: Min guard       General transfer comment: min guard for safety during both sit to stand and stand pivot to bedside commode. cues for hand and foot position during rise and descent. pt takes small steps to pivot during stand pivot to limit left hip movement.  Ambulation/Gait Ambulation/Gait assistance: Min guard;+2 safety/equipment Ambulation Distance (Feet): 5 Feet Assistive device: Rolling walker (2 wheeled) Gait Pattern/deviations: Step-to pattern   Gait velocity interpretation: Below normal speed for age/gender General Gait Details: cues for sequence. pt advances LLE by flexing toes and scooting foot across the floor. chair to follow.  Stairs            Wheelchair Mobility    Modified Rankin (Stroke Patients Only)       Balance Overall balance assessment: Needs assistance   Sitting balance-Leahy Scale: Good       Standing balance-Leahy Scale: Fair                               Pertinent Vitals/Pain Pain Assessment: 0-10 Pain Score: 7  Pain Location: left hip Pain Descriptors / Indicators: Aching Pain Intervention(s): Limited activity within patient's tolerance;Monitored during session;RN gave pain meds during session;Ice applied    Home Living Family/patient expects to be discharged to:: Private residence Living Arrangements: Spouse/significant other Available Help at Discharge: Family Type of Home: House Home Access: Stairs to enter   [employer] of Steps: 3 (3 single steps) Home Layout: Multi-level;Able to  live on main level with bedroom/bathroom Home Equipment: None      Prior Function Level of Independence: Independent               Hand Dominance        Extremity/Trunk Assessment   Upper Extremity Assessment: Defer to OT evaluation           Lower Extremity Assessment: LLE deficits/detail   LLE Deficits / Details: limited  strength and ROM as expected postop  Cervical / Trunk Assessment: Normal  Communication   Communication: No difficulties  Cognition Arousal/Alertness: Awake/alert Behavior During Therapy: WFL for tasks assessed/performed Overall Cognitive Status: Within Functional Limits for tasks assessed                      General Comments      Exercises        Assessment/Plan    PT Assessment Patient needs continued PT services  PT Diagnosis Difficulty walking;Acute pain;Generalized weakness   PT Problem List Decreased strength;Decreased range of motion;Decreased activity tolerance;Decreased balance;Decreased mobility;Decreased knowledge of use of DME  PT Treatment Interventions DME instruction;Gait training;Stair training;Functional mobility training;Therapeutic activities;Therapeutic exercise;Patient/family education   PT Goals (Current goals can be found in the Care Plan section) Acute Rehab PT Goals Patient Stated Goal: be able to ride her horse PT Goal Formulation: With patient Time For Goal Achievement: 03/30/16 Potential to Achieve Goals: Good    Frequency 7X/week   Barriers to discharge        Co-evaluation               End of Session Equipment Utilized During Treatment: Gait belt   Patient left: in chair;with family/visitor present;with call bell/phone within reach           Time: 0851-0930 PT Time Calculation (min) (ACUTE ONLY): 39 min   Charges:   PT Evaluation $PT Eval Moderate Complexity: 1 Procedure PT Treatments $Therapeutic Activity: 8-22 mins   PT G CodesHaze Justin 2016-04-17, 11:02 AM   Haze Justin, SPT [phone]

## 2016-03-23 NOTE — Progress Notes (Signed)
Physical Therapy Progress NOte Pt with slow progression with HEP, transfers and gait but able to advance and walk short distances. Pt educated for ease of transfers and need to improve gait and activity for return home. Pt encouraged to eat as she has not been eating and has nausea with pain meds. Will continue to follow   03/23/16 1228  PT Visit Information  Last PT Received On 03/23/16  Assistance Needed +2 (chair to follow for gait)  History of Present Illness pt admitted for left direct anterior THA with PMHx of HTn, hyperlipidemia, heart murmur, and dyspnea with exertion  PT Time Calculation  PT Start Time (ACUTE ONLY) 1110  PT Stop Time (ACUTE ONLY) 1142  PT Time Calculation (min) (ACUTE ONLY) 32 min  Precautions  Precautions Fall  Restrictions  Weight Bearing Restrictions Yes  LLE Weight Bearing WBAT  Pain Assessment  Pain Assessment 0-10  Pain Score 6  Pain Location left hip  Pain Descriptors / Indicators Aching  Pain Intervention(s) Limited activity within patient's tolerance;Monitored during session;Repositioned;Ice applied  Cognition  Arousal/Alertness Awake/alert  Behavior During Therapy WFL for tasks assessed/performed  Overall Cognitive Status Within Functional Limits for tasks assessed  Bed Mobility  Overal bed mobility Needs Assistance  Bed Mobility Sit to Supine  Sit to supine Min assist  General bed mobility comments cues to hook RLE under LLE to move across bed, and push off bed with RLE to raise and shift hips across bed. min assist for lifting LLE onto bed.  Transfers  Overall transfer level Needs assistance  Transfers Sit to/from Stand  Sit to Stand Min guard  General transfer comment x3 trials from bedside commode and chair. cues for hand and foot placement. min guard for safety  Ambulation/Gait  Ambulation/Gait assistance Min guard  Ambulation Distance (Feet) 10 Feet  Assistive device Rolling walker (2 wheeled)  Gait Pattern/deviations Step-to pattern   General Gait Details x2 trials with seated rest due to nausea. 10' then 6'. cues for sequence and posture. pt progressed to clearing LLE off floor with ambulation.  Gait velocity interpretation Below normal speed for age/gender  Exercises  Exercises Total Joint  Total Joint Exercises  Quad Sets AROM;Left;10 reps;Supine  Short Arc Quad AROM;Left;10 reps;Supine  Heel Slides AROM;Left;10 reps;Supine  Hip ABduction/ADduction AAROM;Left;10 reps;Supine  PT - End of Session  Equipment Utilized During Treatment Gait belt  Activity Tolerance Patient tolerated treatment well;No increased pain;Other (comment) (pt limited by nausea)  Patient left in bed;with call bell/phone within reach;with family/visitor present  PT - Assessment/Plan  PT Plan Current plan remains appropriate  Follow Up Recommendations Home health PT  PT Goal Progression  Progress towards PT goals Progressing toward goals  PT General Charges  $$ ACUTE PT VISIT 1 Procedure  PT Treatments  $Gait Training 8-22 mins  $Therapeutic Exercise 8-22 mins  Elwyn Reach, [place]

## 2016-03-23 NOTE — Progress Notes (Signed)
Patient ID: Ashley Mcdonald, female   DOB: 12/27/1950, 65 y.o.   MRN: [id number] PATIENT ID: Ashley Mcdonald  MRN: [id number]  DOB/AGE:  1951/01/17 / 65 y.o.  1 Day Post-Op Procedure(s) (LRB): TOTAL HIP ARTHROPLASTY ANTERIOR APPROACH (Left)    PROGRESS NOTE Subjective: Patient is alert, oriented, x2 Nausea, no Vomiting, yes passing gas, . Taking PO well. Denies SOB, Chest or Calf Pain. Using Incentive Spirometer, PAS in place. Ambulate WBAT Patient reports pain as  8/10  .    Objective: Vital signs in last 24 hours: Filed Vitals:   03/22/16 1500 03/22/16 1954 03/23/16 0015 03/23/16 0427  BP: 111/57 121/54 118/54 134/59  Pulse: 77 72 70 95  Temp: 98 F (36.7 C) 101.1 F (38.4 C) 98 F (36.7 C) 98.8 F (37.1 C)  TempSrc:      Resp: 21 20 18 18   Height:      Weight:      SpO2: 100% 100% 100% 93%      Intake/Output from previous day: I/O last 3 completed shifts: In: 850 [I.V.:850] Out: 300 [Blood:300]   Intake/Output this shift:     LABORATORY DATA: No results for input(s): WBC, HGB, HCT, PLT, NA, K, CL, CO2, BUN, CREATININE, GLUCOSE, GLUCAP, INR, CALCIUM in the last 72 hours.  Invalid input(s): PT, 2  Examination: Neurologically intact ABD soft Neurovascular intact Sensation intact distally Intact pulses distally Dorsiflexion/Plantar flexion intact Incision: no drainage No cellulitis present Compartment soft} XR AP&Lat of hip shows well placed\fixed THA  Assessment:   1 Day Post-Op Procedure(s) (LRB): TOTAL HIP ARTHROPLASTY ANTERIOR APPROACH (Left) ADDITIONAL DIAGNOSIS:  Expected Acute Blood Loss Anemia,   Plan: PT/OT WBAT, THA  DVT Prophylaxis: SCDx72 hrs, ASA 325 mg BID x 2 weeks  DISCHARGE PLAN: Home  DISCHARGE NEEDS: HHPT, CPM, Walker and 3-in-1 comode seat

## 2016-03-24 ENCOUNTER — Encounter (HOSPITAL_COMMUNITY): Payer: Self-pay | Admitting: General Practice

## 2016-03-24 LAB — CBC
HEMATOCRIT: 29.2 % — AB (ref 36.0–46.0)
HEMOGLOBIN: 10.2 g/dL — AB (ref 12.0–15.0)
MCH: 28.7 pg (ref 26.0–34.0)
MCHC: 34.9 g/dL (ref 30.0–36.0)
MCV: 82.3 fL (ref 78.0–100.0)
Platelets: 143 10*3/uL — ABNORMAL LOW (ref 150–400)
RBC: 3.55 MIL/uL — ABNORMAL LOW (ref 3.87–5.11)
RDW: 12.6 % (ref 11.5–15.5)
WBC: 13.1 10*3/uL — AB (ref 4.0–10.5)

## 2016-03-24 NOTE — Discharge Summary (Signed)
Patient ID: ULDENE SERFASS MRN: [id number] DOB/AGE: 08/12/1951 65 y.o.  Admit date: 03/22/2016 Discharge date: 03/24/2016  Admission Diagnoses:  Principal Problem:   Primary osteoarthritis of left hip Active Problems:   Arthritis of left hip   Discharge Diagnoses:  Same  Past Medical History  Diagnosis Date  . Hypertension   . Hyperlipidemia   . Cystic breast   . Plantar fasciitis 2006    right  . Puncture wound     right leg  . Norovirus   . Complication of anesthesia   . PONV (postoperative nausea and vomiting)   . Heart murmur     " nothing to be concerned about"  . Shortness of breath dyspnea     with exertion  . Arthritis     Surgeries: Procedure(s): TOTAL HIP ARTHROPLASTY ANTERIOR APPROACH on 03/22/2016   Consultants:    Discharged Condition: Improved  Hospital Course: KAYTELYN HALLERAN is an 65 y.o. female who was admitted 03/22/2016 for operative treatment ofPrimary osteoarthritis of left hip. Patient has severe unremitting pain that affects sleep, daily activities, and work/hobbies. After pre-op clearance the patient was taken to the operating room on 03/22/2016 and underwent  Procedure(s): TOTAL HIP ARTHROPLASTY ANTERIOR APPROACH.    Patient was given perioperative antibiotics: Anti-infectives    Start     Dose/Rate Route Frequency Ordered Stop   03/22/16 1030  ceFAZolin (ANCEF) IVPB 2g/100 mL premix     2 g 200 mL/hr over 30 Minutes Intravenous To ShortStay Surgical 03/19/16 0815 03/22/16 1125       Patient was given sequential compression devices, early ambulation, and chemoprophylaxis to prevent DVT.  Patient benefited maximally from hospital stay and there were no complications.    Recent vital signs: Patient Vitals for the past 24 hrs:  BP Temp Temp src Pulse Resp SpO2  03/24/16 0658 (!) 109/46 mmHg 98.6 F (37 C) Oral 77 18 93 %  03/23/16 2043 (!) 113/52 mmHg 98.8 F (37.1 C) Oral 82 18 97 %  03/23/16 1400 (!) 103/49 mmHg 98.1 F (36.7 C) - 86  16 94 %     Recent laboratory studies:  Recent Labs  03/23/16 0639  WBC 7.1  HGB 10.9*  HCT 33.7*  PLT 148*  NA 136  K 4.0  CL 101  CO2 27  BUN 8  CREATININE 0.84  GLUCOSE 127*  CALCIUM 8.5*     Discharge Medications:     Medication List    TAKE these medications        acetaminophen 650 MG CR tablet  Commonly known as:  TYLENOL  Take 650 mg by mouth every morning.     amLODipine 5 MG tablet  Commonly known as:  NORVASC  Take 5 mg by mouth daily.     aspirin EC 325 MG tablet  Take 1 tablet (325 mg total) by mouth 2 (two) times daily.     CALCIUM 600 PO  Take by mouth 2 (two) times daily.     CRANBERRY PO  Take 1 tablet by mouth 2 (two) times daily.     cyclobenzaprine 5 MG tablet  Commonly known as:  FLEXERIL  Take 5 mg by mouth at bedtime as needed for muscle spasms.     ergocalciferol 50000 units capsule  Commonly known as:  VITAMIN D2  Take 50,000 Units by mouth 2 (two) times a week.     Estradiol 10 MCG Tabs vaginal tablet  Place 1 tablet (10 mcg total) vaginally 2 (two)  times a week.     FISH OIL PO  Take 1 tablet by mouth daily.     GLUCOSAMINE CHONDROITIN COMPLX PO  Take 1 tablet by mouth daily.     HYDROcodone-acetaminophen 5-325 MG tablet  Commonly known as:  NORCO/VICODIN  Take 1 tablet by mouth at bedtime.     JUICE PLUS FIBRE PO  Take 1 Can by mouth daily.     OxyCODONE HCl (Abuse Deter) 5 MG Taba  Commonly known as:  OXAYDO  Take 5 mg by mouth at bedtime.     oxyCODONE-acetaminophen 5-325 MG tablet  Commonly known as:  ROXICET  Take 1 tablet by mouth every 4 (four) hours as needed.     PROBIOTIC PO  Take 1 tablet by mouth daily.     Red Yeast Rice 600 MG Caps  Take 600 mg by mouth daily.     telmisartan-hydrochlorothiazide 40-12.5 MG tablet  Commonly known as:  MICARDIS HCT  Take 1 tablet by mouth daily.        Diagnostic Studies: Dg Chest 2 View  03/09/2016  CLINICAL DATA:  Preop left hip arthroplasty EXAM: CHEST   2 VIEW COMPARISON:  03/24/2007 FINDINGS: Normal heart size. No pleural effusion or edema. No airspace consolidation identified. Mild spondylosis is identified within the thoracic spine. IMPRESSION: 1. No acute cardiopulmonary abnormalities. Electronically Signed   By: Kerby Moors M.D.   On: 03/09/2016 14:52   Dg Hip Operative Unilat With Pelvis Left  03/22/2016  CLINICAL DATA:  Anterior left hip replacement. EXAM: OPERATIVE LEFT HIP (WITH PELVIS IF PERFORMED) TWO VIEWS TECHNIQUE: Fluoroscopic spot image(s) were submitted for interpretation post-operatively. COMPARISON:  None. FINDINGS: Fluoroscopy time 32 seconds. Two spot fluoroscopic nondiagnostic intraoperative radiographs demonstrate postsurgical changes from left total hip arthroplasty. IMPRESSION: Intraoperative fluoroscopic guidance for left total hip arthroplasty. Electronically Signed   By: Ilona Sorrel M.D.   On: 03/22/2016 12:49    Disposition:       Discharge Instructions    Call MD / Call 911    Complete by:  As directed   If you experience chest pain or shortness of breath, CALL 911 and be transported to the hospital emergency room.  If you develope a fever above 101 F, pus (white drainage) or increased drainage or redness at the wound, or calf pain, call your surgeon's office.     Constipation Prevention    Complete by:  As directed   Drink plenty of fluids.  Prune juice may be helpful.  You may use a stool softener, such as Colace (over the counter) 100 mg twice a day.  Use MiraLax (over the counter) for constipation as needed.     Diet - low sodium heart healthy    Complete by:  As directed      Driving restrictions    Complete by:  As directed   No driving for 2 weeks     Follow the hip precautions as taught in Physical Therapy    Complete by:  As directed      Increase activity slowly as tolerated    Complete by:  As directed      Patient may shower    Complete by:  As directed   You may shower without a dressing  once there is no drainage.  Do not wash over the wound.  If drainage remains, cover wound with plastic wrap and then shower.           Follow-up Information  Follow up with Kerin Salen, MD In 2 weeks.   Specialty:  Orthopedic Surgery   Contact information:   [place] [address] [phone]       Follow up with [facility].   Why:  Someone from [facility] will contact you concerning start date and time for therapy   Contact information:   [address] [place] [address] [phone]        Signed: Hardin Negus, Challen Spainhour R 03/24/2016, 8:03 AM

## 2016-03-24 NOTE — Progress Notes (Signed)
Physical Therapy Treatment Patient Details Name: Ashley Mcdonald MRN: [id number] DOB: June 15, 1951 Today's Date: 03/24/2016    History of Present Illness pt admitted for left direct anterior THA with PMHx of HTn, hyperlipidemia, heart murmur, and dyspnea with exertion    PT Comments    Patient is making good progress with PT.  Stair training complete. Husband present and actively participating in session. From a mobility standpoint anticipate patient will be ready for DC home when medically ready.     Follow Up Recommendations  Home health PT     Equipment Recommendations  Rolling walker with 5" wheels;3in1 (PT)    Recommendations for Other Services       Precautions / Restrictions Precautions Precautions: Fall Restrictions Weight Bearing Restrictions: Yes LLE Weight Bearing: Weight bearing as tolerated    Mobility  Bed Mobility               General bed mobility comments: OOB in chair upon arrival  Transfers Overall transfer level: Needs assistance Equipment used: Rolling walker (2 wheeled) Transfers: Sit to/from Stand Sit to Stand: Min guard         General transfer comment: safe hand placement and technique; min guard for safety  Ambulation/Gait Ambulation/Gait assistance: Supervision Ambulation Distance (Feet): 200 Feet Assistive device: Rolling walker (2 wheeled) Gait Pattern/deviations: Step-through pattern;Decreased stride length     General Gait Details: safe use of AD with improved gait mechanics this session; slow and steady gait   Stairs Stairs: Yes Stairs assistance: Min guard Stair Management: No rails;Backwards;With walker Number of Stairs: 3 General stair comments: educated on sequencing and technique with husband stabilizing RW   Wheelchair Mobility    Modified Rankin (Stroke Patients Only)       Balance                                    Cognition Arousal/Alertness: Awake/alert Behavior During Therapy: WFL  for tasks assessed/performed Overall Cognitive Status: Within Functional Limits for tasks assessed                      Exercises      General Comments General comments (skin integrity, edema, etc.): given HEP handout and reviewed HEP and use of ice      Pertinent Vitals/Pain Pain Assessment: 0-10 Pain Score: 5  Pain Location: L hip Pain Descriptors / Indicators: Sore Pain Intervention(s): Limited activity within patient's tolerance;Monitored during session;Premedicated before session;Repositioned;Ice applied    Home Living Family/patient expects to be discharged to:: Private residence Living Arrangements: Spouse/significant other                  Prior Function            PT Goals (current goals can now be found in the care plan section) Acute Rehab PT Goals Patient Stated Goal: go home Progress towards PT goals: Progressing toward goals    Frequency  7X/week    PT Plan Current plan remains appropriate    Co-evaluation PT/OT/SLP Co-Evaluation/Treatment: Yes Reason for Co-Treatment: For patient/therapist safety PT goals addressed during session: Mobility/safety with mobility;Balance;Proper use of DME;Strengthening/ROM       End of Session Equipment Utilized During Treatment: Gait belt Activity Tolerance: Patient tolerated treatment well Patient left: with call bell/phone within reach;with family/visitor present;in chair     Time: [id number] PT Time Calculation (min) (ACUTE ONLY): 28 min  Charges:  $Gait  Training: 8-22 mins                    G Codes:      [place] April, [place] Pager: [phone]   03/24/2016, 10:48 AM

## 2016-03-24 NOTE — Progress Notes (Signed)
PATIENT ID: Ashley Mcdonald  MRN: [id number]  DOB/AGE:  1951-04-03 / 65 y.o.  2 Days Post-Op Procedure(s) (LRB): TOTAL HIP ARTHROPLASTY ANTERIOR APPROACH (Left)    PROGRESS NOTE Subjective: Patient is alert, oriented, no Nausea, no Vomiting, yes passing gas, . Taking PO well with small bites. Denies SOB, Chest or Calf Pain. Using Incentive Spirometer, PAS in place. Ambulate WBAT with pt walking 10 ft with therapy Patient reports pain as  5/10  .    Objective: Vital signs in last 24 hours: Filed Vitals:   03/23/16 0427 03/23/16 1400 03/23/16 2043 03/24/16 0658  BP: 134/59 103/49 113/52 109/46  Pulse: 95 86 82 77  Temp: 98.8 F (37.1 C) 98.1 F (36.7 C) 98.8 F (37.1 C) 98.6 F (37 C)  TempSrc:   Oral Oral  Resp: 18 16 18 18   Height:      Weight:      SpO2: 93% 94% 97% 93%      Intake/Output from previous day: I/O last 3 completed shifts: In: 720 [P.O.:720] Out: -    Intake/Output this shift:     LABORATORY DATA:  Recent Labs  03/23/16 0639  WBC 7.1  HGB 10.9*  HCT 33.7*  PLT 148*  NA 136  K 4.0  CL 101  CO2 27  BUN 8  CREATININE 0.84  GLUCOSE 127*  CALCIUM 8.5*    Examination: Neurologically intact Neurovascular intact Sensation intact distally Intact pulses distally Dorsiflexion/Plantar flexion intact Incision: dressing C/D/I No cellulitis present Compartment soft} XR AP&Lat of hip shows well placed\fixed THA  Assessment:   2 Days Post-Op Procedure(s) (LRB): TOTAL HIP ARTHROPLASTY ANTERIOR APPROACH (Left) ADDITIONAL DIAGNOSIS:  Expected Acute Blood Loss Anemia,   Plan: PT/OT WBAT, THA  DVT Prophylaxis: SCDx72 hrs, ASA 325 mg BID x 2 weeks  DISCHARGE PLAN: Home, when pt passes therapy goals  DISCHARGE NEEDS: HHPT, Walker and 3-in-1 comode seat

## 2016-03-24 NOTE — Progress Notes (Signed)
Pt to be discharged home today with husband. This RN went over discharge instructions and prescriptions. All questions were answered. IV was d/c and equipment for home was at bedside.

## 2016-03-24 NOTE — Progress Notes (Signed)
Occupational Therapy Treatment Patient Details Name: Ashley Mcdonald MRN: [id number] DOB: [birth year] Today's Date: 03/24/2016    History of present illness pt admitted for left direct anterior THA with PMHx of HTn, hyperlipidemia, heart murmur, and dyspnea with exertion   OT comments  Pt making good progress toward OT goals this session. Currently pt is overall close supervision for safety with walk in shower transfers; pt and husband able to return demo correct technique. Educated pt and husband on use of AE for increased independence with LB ADLs. D/c plan remains appropriate at this time. Will continue to follow acutely.   Follow Up Recommendations  No OT follow up    Equipment Recommendations  3 in 1 bedside comode    Recommendations for Other Services      Precautions / Restrictions Precautions Precautions: Fall Restrictions Weight Bearing Restrictions: Yes LLE Weight Bearing: Weight bearing as tolerated       Mobility Bed Mobility               General bed mobility comments: Pt OOB in chair upon arrival.  Transfers Overall transfer level: Needs assistance Equipment used: Rolling walker (2 wheeled) Transfers: Sit to/from Stand Sit to Stand: Supervision         General transfer comment: Supervision for safety. Good hand placement and technique.    Balance Overall balance assessment: Needs assistance Sitting-balance support: Feet supported;No upper extremity supported Sitting balance-Leahy Scale: Good     Standing balance support: No upper extremity supported;During functional activity Standing balance-Leahy Scale: Fair                     ADL Overall ADL's : Needs assistance/impaired               Lower Body Bathing Details (indicate cue type and reason): Educated on use of long handled sponge for increased independence with LB bathing.     Lower Body Dressing: Supervision/safety;Sit to/from stand;With adaptive equipment Lower Body  Dressing Details (indicate cue type and reason): Educated pt and husband on use of long handled shoe horn, sock aide, and reacher; pt able to return demo use of reacher and sock aide. Educated on L leg first into clothing.         Tub/ Shower Transfer: Supervision/safety;Ambulation;Shower seat;Walk-in Academic librarian Details (indicate cue type and reason): Educated pt and husband on walk in shower transfer technique; pt able to return demo with supervision for safety and husband assisting to [employer]. Functional mobility during ADLs: Supervision/safety;Rolling walker General ADL Comments: Educated on supervision for safety with shower transfers, use of leg lifter for assist of LLE into/out of bed.      Vision                     Perception     Praxis      Cognition   Behavior During Therapy: WFL for tasks assessed/performed Overall Cognitive Status: Within Functional Limits for tasks assessed                       Extremity/Trunk Assessment               Exercises     Shoulder Instructions       General Comments      Pertinent Vitals/ Pain       Pain Assessment: 0-10 Pain Score: 5  Pain Location: L hip Pain Descriptors / Indicators: Sore Pain Intervention(s): Limited activity  within patient's tolerance;Monitored during session;Premedicated before session;Repositioned;Ice applied  Home Living Family/patient expects to be discharged to:: Private residence Living Arrangements: Spouse/significant other                                      Prior Functioning/Environment              Frequency Min 2X/week     Progress Toward Goals  OT Goals(current goals can now be found in the care plan section)  Progress towards OT goals: Progressing toward goals  Acute Rehab OT Goals Patient Stated Goal: go home OT Goal Formulation: With patient  Plan Discharge plan remains appropriate     Co-evaluation    PT/OT/SLP Co-Evaluation/Treatment: Yes Reason for Co-Treatment: For patient/therapist safety PT goals addressed during session: Mobility/safety with mobility;Balance;Proper use of DME;Strengthening/ROM OT goals addressed during session: ADL's and self-care;Proper use of Adaptive equipment and DME;Other (comment) (mobility)      End of Session Equipment Utilized During Treatment: Gait belt;Rolling walker   Activity Tolerance Patient tolerated treatment well   Patient Left in chair;with call bell/phone within reach;with family/visitor present   Nurse Communication          Time: [id number] OT Time Calculation (min): 28 min  Charges: OT General Charges $OT Visit: 1 Procedure OT Treatments $Self Care/Home Management : 8-22 mins  Binnie Kand M.S., OTR/L Pager: [phone]  03/24/2016, 1:06 PM

## 2016-10-29 ENCOUNTER — Ambulatory Visit (INDEPENDENT_AMBULATORY_CARE_PROVIDER_SITE_OTHER): Payer: BLUE CROSS/BLUE SHIELD | Admitting: Certified Nurse Midwife

## 2016-10-29 ENCOUNTER — Encounter: Payer: Self-pay | Admitting: Certified Nurse Midwife

## 2016-10-29 VITALS — BP 120/78 | HR 70 | Resp 16 | Ht 62.75 in | Wt 165.0 lb

## 2016-10-29 DIAGNOSIS — Z124 Encounter for screening for malignant neoplasm of cervix: Secondary | ICD-10-CM | POA: Diagnosis not present

## 2016-10-29 DIAGNOSIS — Z01419 Encounter for gynecological examination (general) (routine) without abnormal findings: Secondary | ICD-10-CM | POA: Diagnosis not present

## 2016-10-29 DIAGNOSIS — Z0000 Encounter for general adult medical examination without abnormal findings: Secondary | ICD-10-CM

## 2016-10-29 LAB — POCT URINALYSIS DIPSTICK
Bilirubin, UA: NEGATIVE
GLUCOSE UA: NEGATIVE
Ketones, UA: NEGATIVE
Leukocytes, UA: NEGATIVE
NITRITE UA: NEGATIVE
PROTEIN UA: NEGATIVE
RBC UA: NEGATIVE
UROBILINOGEN UA: NEGATIVE
pH, UA: 5

## 2016-10-29 NOTE — Patient Instructions (Signed)

## 2016-10-29 NOTE — Progress Notes (Signed)
65 y.o. G4P0010 Married  Caucasian Fe here for annual exam. Menopausal no HRT. Denies vaginal bleeding or vaginal dryness. Had left hip replacement with Dr. Hal Morales and doing well. Sees Dr. Nicki Reaper PCP for aex and labs and hypertension management, all normal per patient. Stopped vaginal estrogen use due to cost and has been using OTC , but not working as well. No other health issues today. Worked loading hay recently with horses!  Patient's last menstrual period was 09/12/2002.          Sexually active: No.  The current method of family planning is post menopausal status.    Exercising: No.  exercise Smoker:  no  Health Maintenance: Pap:  10-18-14 neg MMG:  12-09-15 category b density birads 1:neg Colonoscopy:  2017 f/u 35yrs BMD:   2014 TDaP:  2017 Shingles: had done Pneumonia: had done Hep C and HIV: not done Labs: poct urine-neg, hgb-12.3 Self breast exam: done monthly    reports that she has never smoked. She has never used smokeless tobacco. She reports that she does not drink alcohol or use drugs.  Past Medical History:  Diagnosis Date  . Arthritis   . Complication of anesthesia   . Cystic breast   . Heart murmur    " nothing to be concerned about"  . Hyperlipidemia   . Hypertension   . Norovirus   . Plantar fasciitis 2006   right  . PONV (postoperative nausea and vomiting)   . Puncture wound    right leg  . Shortness of breath dyspnea    with exertion    Past Surgical History:  Procedure Laterality Date  . COLONOSCOPY    . HYSTEROSCOPY     D&C polypectomy  . SPINAL FUSION  2008   c5-7  . TOTAL HIP ARTHROPLASTY Left 03/22/2016   Procedure: TOTAL HIP ARTHROPLASTY ANTERIOR APPROACH;  Surgeon: Frederik Pear, MD;  Location: [facility];  Service: Orthopedics;  Laterality: Left;    Current Outpatient Prescriptions  Medication Sig Dispense Refill  . acetaminophen (TYLENOL) 650 MG CR tablet Take 650 mg by mouth every morning.    Marland Kitchen amLODipine (NORVASC) 5 MG tablet Take 5 mg by  mouth daily.  3  . aspirin EC 325 MG tablet Take 1 tablet (325 mg total) by mouth 2 (two) times daily. 30 tablet 0  . Calcium Carbonate (CALCIUM 600 PO) Take by mouth 2 (two) times daily.    Marland Kitchen CRANBERRY PO Take 1 tablet by mouth 2 (two) times daily.     . cyclobenzaprine (FLEXERIL) 5 MG tablet Take 5 mg by mouth at bedtime as needed for muscle spasms.     . ergocalciferol (VITAMIN D2) 50000 UNITS capsule Take 50,000 Units by mouth 2 (two) times a week.     . Estradiol 10 MCG TABS vaginal tablet Place 1 tablet (10 mcg total) vaginally 2 (two) times a week. 8 tablet 11  . GLUCOSAMINE CHONDROITIN COMPLX PO Take 1 tablet by mouth daily.     Marland Kitchen HYDROcodone-acetaminophen (NORCO/VICODIN) 5-325 MG tablet Take 1 tablet by mouth at bedtime.  0  . Nutritional Supplements (JUICE PLUS FIBRE PO) Take 1 Can by mouth daily.     . Omega-3 Fatty Acids (FISH OIL PO) Take 1 tablet by mouth daily.     . OxyCODONE HCl, Abuse Deter, (OXAYDO) 5 MG TABA Take 5 mg by mouth at bedtime.    Marland Kitchen oxyCODONE-acetaminophen (ROXICET) 5-325 MG tablet Take 1 tablet by mouth every 4 (four) hours as needed.  60 tablet 0  . Probiotic Product (PROBIOTIC PO) Take 1 tablet by mouth daily.     . Red Yeast Rice 600 MG CAPS Take 600 mg by mouth daily.     Marland Kitchen telmisartan-hydrochlorothiazide (MICARDIS HCT) 40-12.5 MG tablet Take 1 tablet by mouth daily.  3   No current facility-administered medications for this visit.     Family History  Problem Relation Age of Onset  . Stroke Mother   . Heart attack Father   . Cancer Maternal Grandmother     uterine?  . Diabetes Paternal Grandmother     ROS:  Pertinent items are noted in HPI.  Otherwise, a comprehensive ROS was negative.  Exam:   LMP 09/12/2002    Ht Readings from Last 3 Encounters:  03/22/16 5\' 3"  (1.6 m)  03/09/16 5\' 3"  (1.6 m)  10/24/15 5' 2.75" (1.594 m)    General appearance: alert, cooperative and appears stated age Head: Normocephalic, without obvious abnormality,  atraumatic Neck: no adenopathy, supple, symmetrical, trachea midline and thyroid normal to inspection and palpation Lungs: clear to auscultation bilaterally Breasts: normal appearance, no masses or tenderness, No nipple retraction or dimpling, No nipple discharge or bleeding, No axillary or supraclavicular adenopathy Heart: regular rate and rhythm Abdomen: soft, non-tender; no masses,  no organomegaly Extremities: extremities normal, atraumatic, no cyanosis or edema Skin: Skin color, texture, turgor normal. No rashes or lesions Lymph nodes: Cervical, supraclavicular, and axillary nodes normal. No abnormal inguinal nodes palpated Neurologic: Grossly normal   Pelvic: External genitalia:  no lesions              Urethra:  normal appearing urethra with no masses, tenderness or lesions              Bartholin's and Skene's: normal                 Vagina: Atrophic appearing vagina with normal color and discharge, no lesions, slight moisture noted              Cervix: no cervical motion tenderness, no lesions and nulliparous appearance              Pap taken: Yes.   Bimanual Exam:  Uterus:  normal size, contour, position, consistency, mobility, non-tender              Adnexa: normal adnexa and no mass, fullness, tenderness               Rectovaginal: Confirms               Anus:  normal sphincter tone, no lesions  Chaperone present: yes  A:  Well Woman with normal exam  Menopausal no HRT  Atrophic vaginitis previous premarin use  Hypertension management per PCP  Screening labs and BMD due  P:   Reviewed health and wellness pertinent to exam  Aware of need to evaluate if vaginal bleeding  Discussed OTC use of coconut  Oil to see if this will work better. Instructions given. Declines Rx again for Premarin.  Continue follow up with PCP as indicated.  Lab:Hep C,HIV, Patient will schedule BMD with mammogram coming up  Pap smear as above with HPV reflex  counseled on breast self exam,  mammography screening, feminine hygiene, adequate intake of calcium and vitamin D, diet and exercise  return annually or prn  An After Visit Summary was printed and given to the patient.

## 2016-10-30 LAB — HIV ANTIBODY (ROUTINE TESTING W REFLEX): HIV: NONREACTIVE

## 2016-10-30 LAB — HEPATITIS C ANTIBODY: HCV Ab: NEGATIVE

## 2016-11-01 LAB — HEMOGLOBIN, FINGERSTICK: HEMOGLOBIN, FINGERSTICK: 12.3 g/dL (ref 12.0–16.0)

## 2016-11-02 LAB — IPS PAP TEST WITH REFLEX TO HPV

## 2016-11-02 NOTE — Progress Notes (Signed)
Encounter reviewed Jill Jertson, MD   

## 2016-12-17 DIAGNOSIS — R922 Inconclusive mammogram: Secondary | ICD-10-CM | POA: Diagnosis not present

## 2016-12-24 ENCOUNTER — Telehealth: Payer: Self-pay | Admitting: Certified Nurse Midwife

## 2016-12-24 NOTE — Telephone Encounter (Signed)
Pt given results of bmd. See scanned in results.

## 2016-12-24 NOTE — Telephone Encounter (Signed)
Spoke with patient. Patient states she had BMD at [facility] on 12/10/16. Would like to know if we have results? Advised patient will review with Melvia Heaps, CNM and return call. Patient is agreeable.  Melvia Heaps, CNM -have you received BMD results?

## 2016-12-24 NOTE — Telephone Encounter (Signed)
Patient wants to know if we have her bone density results

## 2016-12-24 NOTE — Telephone Encounter (Signed)
Pt notified of bmd results.

## 2017-01-19 ENCOUNTER — Encounter: Payer: Self-pay | Admitting: Certified Nurse Midwife

## 2017-02-01 DIAGNOSIS — D2239 Melanocytic nevi of other parts of face: Secondary | ICD-10-CM | POA: Diagnosis not present

## 2017-02-01 DIAGNOSIS — L820 Inflamed seborrheic keratosis: Secondary | ICD-10-CM | POA: Diagnosis not present

## 2017-02-01 DIAGNOSIS — L918 Other hypertrophic disorders of the skin: Secondary | ICD-10-CM | POA: Diagnosis not present

## 2017-02-01 DIAGNOSIS — D225 Melanocytic nevi of trunk: Secondary | ICD-10-CM | POA: Diagnosis not present

## 2017-02-01 DIAGNOSIS — D1801 Hemangioma of skin and subcutaneous tissue: Secondary | ICD-10-CM | POA: Diagnosis not present

## 2017-02-04 DIAGNOSIS — M1612 Unilateral primary osteoarthritis, left hip: Secondary | ICD-10-CM | POA: Diagnosis not present

## 2017-05-13 ENCOUNTER — Encounter (HOSPITAL_COMMUNITY): Payer: Self-pay | Admitting: Emergency Medicine

## 2017-05-13 ENCOUNTER — Emergency Department (HOSPITAL_COMMUNITY): Payer: Medicare Other

## 2017-05-13 ENCOUNTER — Other Ambulatory Visit: Payer: Self-pay

## 2017-05-13 ENCOUNTER — Emergency Department (HOSPITAL_COMMUNITY)
Admission: EM | Admit: 2017-05-13 | Discharge: 2017-05-13 | Disposition: A | Discharge status: Home / Self Care | Payer: Medicare Other | Attending: Emergency Medicine | Admitting: Emergency Medicine

## 2017-05-13 DIAGNOSIS — R000 Tachycardia, unspecified: Secondary | ICD-10-CM | POA: Diagnosis not present

## 2017-05-13 DIAGNOSIS — Y999 Unspecified external cause status: Secondary | ICD-10-CM | POA: Diagnosis not present

## 2017-05-13 DIAGNOSIS — Z79899 Other long term (current) drug therapy: Secondary | ICD-10-CM | POA: Diagnosis not present

## 2017-05-13 DIAGNOSIS — Z96642 Presence of left artificial hip joint: Secondary | ICD-10-CM | POA: Diagnosis not present

## 2017-05-13 DIAGNOSIS — R002 Palpitations: Secondary | ICD-10-CM

## 2017-05-13 DIAGNOSIS — W57XXXA Bitten or stung by nonvenomous insect and other nonvenomous arthropods, initial encounter: Secondary | ICD-10-CM | POA: Diagnosis not present

## 2017-05-13 DIAGNOSIS — S70361A Insect bite (nonvenomous), right thigh, initial encounter: Secondary | ICD-10-CM | POA: Diagnosis not present

## 2017-05-13 DIAGNOSIS — I10 Essential (primary) hypertension: Secondary | ICD-10-CM | POA: Insufficient documentation

## 2017-05-13 DIAGNOSIS — Y929 Unspecified place or not applicable: Secondary | ICD-10-CM | POA: Diagnosis not present

## 2017-05-13 DIAGNOSIS — Y939 Activity, unspecified: Secondary | ICD-10-CM | POA: Insufficient documentation

## 2017-05-13 LAB — CBC
HCT: 39.1 % (ref 36.0–46.0)
HEMOGLOBIN: 13.1 g/dL (ref 12.0–15.0)
MCH: 27.5 pg (ref 26.0–34.0)
MCHC: 33.5 g/dL (ref 30.0–36.0)
MCV: 82.1 fL (ref 78.0–100.0)
Platelets: 218 10*3/uL (ref 150–400)
RBC: 4.76 MIL/uL (ref 3.87–5.11)
RDW: 13.1 % (ref 11.5–15.5)
WBC: 5 10*3/uL (ref 4.0–10.5)

## 2017-05-13 LAB — BASIC METABOLIC PANEL
ANION GAP: 9 (ref 5–15)
BUN: 10 mg/dL (ref 6–20)
CALCIUM: 9.5 mg/dL (ref 8.9–10.3)
CO2: 25 mmol/L (ref 22–32)
Chloride: 107 mmol/L (ref 101–111)
Creatinine, Ser: 0.75 mg/dL (ref 0.44–1.00)
Glucose, Bld: 98 mg/dL (ref 65–99)
POTASSIUM: 4 mmol/L (ref 3.5–5.1)
Sodium: 141 mmol/L (ref 135–145)

## 2017-05-13 LAB — TSH: TSH: 1.613 u[IU]/mL (ref 0.350–4.500)

## 2017-05-13 LAB — I-STAT TROPONIN, ED: TROPONIN I, POC: 0 ng/mL (ref 0.00–0.08)

## 2017-05-13 LAB — T4, FREE: Free T4: 0.85 ng/dL (ref 0.61–1.12)

## 2017-05-13 MED ORDER — DILTIAZEM HCL ER COATED BEADS 120 MG PO CP24
120.0000 mg | ORAL_CAPSULE | Freq: Once | ORAL | Status: AC
  Administered 2017-05-13: 120 mg via ORAL
  Filled 2017-05-13: qty 1

## 2017-05-13 MED ORDER — DILTIAZEM HCL ER 60 MG PO CP12: 60.0000 mg | ORAL_CAPSULE | Freq: Two times a day (BID) | ORAL | 0 refills | Status: DC

## 2017-05-13 MED ORDER — DOXYCYCLINE HYCLATE 100 MG PO TABS
200.0000 mg | ORAL_TABLET | Freq: Once | ORAL | Status: AC
  Administered 2017-05-13: 200 mg via ORAL
  Filled 2017-05-13: qty 2

## 2017-05-13 NOTE — ED Notes (Signed)
Pt ambulated to bathroom with no assistance.  

## 2017-05-13 NOTE — Discharge Instructions (Signed)
The workup in the ER shows possible atrial tachycardia. We discussed the case with Cardiologist, and they recommend discharge home with close primary care doctor follow up to get 14 day heart monitoring. Return to the ER if there is any fainting, near fainting, severe chest pain, shortness of breath.  Call your doctor tomorrow - if they are unable to accommodate call our Cardiologist.

## 2017-05-13 NOTE — ED Notes (Signed)
Patient transported to X-ray 

## 2017-05-13 NOTE — ED Provider Notes (Signed)
      Medical screening examination/treatment/procedure(s) were conducted as a shared visit with non-physician practitioner(s) and myself.  I personally evaluated the patient during the encounter.   EKG Interpretation  Date/Time:  Friday May 13 2017 12:18:49 EDT Ventricular Rate:  117 PR Interval:  146 QRS Duration: 68 QT Interval:  302 QTC Calculation: 421 R Axis:   72 Text Interpretation:  Sinus tachycardia Otherwise normal ECG No acute changes pt's HR increases towards the end to HR of 150 Confirmed by Varney Biles [id number]) on 05/13/2017 6:58:48 PM       I spoke with the Cardiologist and reviewed the 2 traces above. The cardiologist thinks that this is likely atrial dysrhythmia of MAT variety and not AFIb. He recommends starting patient on cardiazem and having her see her PCP for holter monitoring to get a better idea about the morphiology of her dysrhythmia. He doesn't think pt needs to be admitted, given that he thinks it is unlikely that pt is having AF.  Results from the ER workup discussed with the patient face to face and all questions answered to the best of my ability.  Strict ER return precautions have been discussed, and patient is agreeing with the plan and is comfortable with the workup done and the recommendations from the ER.  Pt will see our Cardiologist if her pcp cant accommodate.   Varney Biles, MD 05/13/17 [phone]

## 2017-05-13 NOTE — ED Triage Notes (Addendum)
Pt reports feeling like he heart was racing 2 nights ago while lying in bed, states she still feels like her heart is racing, denies cp or sob. Called PCP but they could not see her until Monday. HR 69-132 while in triage. Pt a/ox4, resp e/u, nad. Pt also reports finding a tick on the back of her leg this morning.

## 2017-05-13 NOTE — ED Provider Notes (Signed)
[facility] DEPT Provider Note   CSN: [id number] Arrival date & time: 05/13/17  1210     History   Chief Complaint Chief Complaint  Patient presents with  . Palpitations    HPI Ashley Mcdonald is a 66 y.o. female.  HPI   66 year old female presents today with complaints of palpitations. Patient notes that 2 nights ago while lying down in bed she did feel the sensation of her heart beating. She reports she took her heart rate and noticed he was in the 130s. She denies any associated chest pain, dizziness, shortness of breath with this. She went to bed and woke up in the morning without complaints. She notes going through her day is normal with no sensation of her heart beating. She reports the sensation returned this evening. Patient again denies any associated complaints. No nausea vomiting diaphoresis. Patient notes an identical presentation approximately 20 years ago when she was diagnosed with thyroiditis.   Patient denies any history of DVT or PE, denies any significant risk factors.   Patient also notes a tick on her right lower thigh, no significant surrounding redness. She did she took it off this morning. Unknown duration of attachment   Past Medical History:  Diagnosis Date  . Arthritis   . Complication of anesthesia   . Cystic breast   . Heart murmur    " nothing to be concerned about"  . Hyperlipidemia   . Hypertension   . Norovirus   . Plantar fasciitis 2006   right  . PONV (postoperative nausea and vomiting)   . Puncture wound    right leg  . Shortness of breath dyspnea    with exertion    Patient Active Problem List   Diagnosis Date Noted  . Arthritis of left hip 03/22/2016  . Primary osteoarthritis of left hip 03/20/2016  . 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    Past Surgical History:  Procedure Laterality Date  . COLONOSCOPY    . HYSTEROSCOPY     D&C polypectomy  . SPINAL FUSION  2008   c5-7    . TOTAL HIP ARTHROPLASTY Left 03/22/2016   Procedure: TOTAL HIP ARTHROPLASTY ANTERIOR APPROACH;  Surgeon: Frederik Pear, MD;  Location: [facility];  Service: Orthopedics;  Laterality: Left;    OB History    Gravida Para Term Preterm AB Living   1 0 0 0 1 0   SAB TAB Ectopic Multiple Live Births                   Home Medications    Prior to Admission medications   Medication Sig Start Date End Date Taking? Authorizing Provider  amLODipine (NORVASC) 5 MG tablet Take 5 mg by mouth every morning.  10/11/15  Yes [provider]  calcium-vitamin D (OSCAL WITH D) 500-200 MG-UNIT tablet Take 1 tablet by mouth 2 (two) times daily.   Yes [provider]  cyclobenzaprine (FLEXERIL) 5 MG tablet Take 5 mg by mouth at bedtime as needed for muscle spasms.  10/05/13  Yes [provider]  ibuprofen (ADVIL,MOTRIN) 200 MG tablet Take 200 mg by mouth every 6 (six) hours as needed for moderate pain.   Yes [provider]  Menthol (BIOFREEZE) 10 % AERO Apply 1 application topically daily as needed (Muscle pain).   Yes [provider]  Nutritional Supplements (JUICE PLUS FIBRE PO) Take 1 Can by mouth daily.    Yes [provider]  telmisartan-hydrochlorothiazide (  MICARDIS HCT) 40-12.5 MG tablet Take 1 tablet by mouth every morning.  10/11/15  Yes [provider]  Calcium Carbonate (CALCIUM 600 PO) Take by mouth 2 (two) times daily.    [provider]  CRANBERRY PO Take 1 tablet by mouth 2 (two) times daily.     [provider]  ergocalciferol (VITAMIN D2) 50000 UNITS capsule Take 50,000 Units by mouth 2 (two) times a week.     [provider]  GLUCOSAMINE CHONDROITIN COMPLX PO Take 1 tablet by mouth daily.     [provider]  Omega-3 Fatty Acids (FISH OIL PO) Take 1 tablet by mouth daily.     [provider]  Red Yeast Rice 600 MG CAPS Take 600 mg by mouth daily.     [provider]    Family  History Family History  Problem Relation Age of Onset  . Stroke Mother   . Heart attack Father   . Cancer Maternal Grandmother        uterine?  . Diabetes Paternal Grandmother     Social History Social History  Substance Use Topics  . Smoking status: Never Smoker  . Smokeless tobacco: Never Used  . Alcohol use No     Allergies   Nsaids   Review of Systems Review of Systems  All other systems reviewed and are negative.  Physical Exam Updated Vital Signs BP 126/65   Pulse 78   Temp 97.8 F (36.6 C) (Oral)   Resp 20   Ht 5\' 3"  (1.6 m)   Wt 76.7 kg (169 lb)   LMP 09/12/2002   SpO2 97%   BMI 29.94 kg/m   Physical Exam  Constitutional: She is oriented to person, place, and time. She appears well-developed and well-nourished.  HENT:  Head: Normocephalic and atraumatic.  Eyes: Conjunctivae are normal. Pupils are equal, round, and reactive to light. Right eye exhibits no discharge. Left eye exhibits no discharge. No scleral icterus.  Neck: Normal range of motion. No JVD present. No tracheal deviation present.  Cardiovascular: Normal rate, regular rhythm, normal heart sounds and intact distal pulses.   Pulmonary/Chest: Effort normal and breath sounds normal. No stridor. No respiratory distress. She has no wheezes. She has no rales. She exhibits no tenderness.  Neurological: She is alert and oriented to person, place, and time. Coordination normal.  Psychiatric: She has a normal mood and affect. Her behavior is normal. Judgment and thought content normal.  Nursing note and vitals reviewed.    ED Treatments / Results  Labs (all labs ordered are listed, but only abnormal results are displayed) Labs Reviewed  BASIC METABOLIC PANEL  CBC  TSH  T4, FREE  T3, FREE  I-STAT TROPOININ, ED    EKG  EKG Interpretation  Date/Time:  Friday May 13 2017 12:18:49 EDT Ventricular Rate:  117 PR Interval:  146 QRS Duration: 68 QT Interval:  302 QTC Calculation: 421 R  Axis:   72 Text Interpretation:  Sinus tachycardia Otherwise normal ECG No acute changes pt's HR increases towards the end to HR of 150 Confirmed by Varney Biles ([id number]) on 05/13/2017 6:58:48 PM       Radiology Dg Chest 2 View  Result Date: 05/13/2017 CLINICAL DATA:  Cardiac palpitations EXAM: CHEST  2 VIEW COMPARISON:  March 09, 2016 FINDINGS: There is mild scarring in the left base. There is no edema or consolidation. The heart size and pulmonary vascularity are normal. No adenopathy. There is postoperative change in the lower  cervical region. There is degenerative change in the thoracic spine. IMPRESSION: Mild scarring left base.  No edema or consolidation. Electronically Signed   By: Lowella Grip III M.D.   On: 05/13/2017 12:57    Procedures Procedures (including critical care time)  Medications Ordered in ED Medications  doxycycline (VIBRA-TABS) tablet 200 mg (200 mg Oral Given 05/13/17 2005)     Initial Impression / Assessment and Plan / ED Course  I have reviewed the triage vital signs and the nursing notes.  Pertinent labs & imaging results that were available during my care of the patient were reviewed by me and considered in my medical decision making (see chart for details).      Final Clinical Impressions(s) / ED Diagnoses   Final diagnoses:  Palpitations  Tick bite, initial encounter     66 year old female presents today with complaints palpitations. Patient upon my evaluation had sinus rhythm, and was asymptomatic. Patient had several runs of tachycardia arrhythmia here in the ED. Patient's troponin normal, no significant elevation and thyroid studies or laboratory abnormalities. No other known aggravating symptoms. Patient does have a history of tachycardia secondary to thyroiditis, unlikely in this patient. Patient care shared with Dr. Kathrynn Humble who personally evaluated the patient, cardiology to be consulted.  New Prescriptions New Prescriptions   No  medications on file     Francee Gentile 05/13/17 2045    Okey Regal, PA-C 05/18/17 2013    Varney Biles, MD 05/20/17 [phone]

## 2017-05-14 LAB — T3, FREE: T3, Free: 3.3 pg/mL (ref 2.0–4.4)

## 2017-05-16 DIAGNOSIS — Z9181 History of falling: Secondary | ICD-10-CM | POA: Diagnosis not present

## 2017-05-16 DIAGNOSIS — R002 Palpitations: Secondary | ICD-10-CM | POA: Diagnosis not present

## 2017-05-16 DIAGNOSIS — Z6829 Body mass index (BMI) 29.0-29.9, adult: Secondary | ICD-10-CM | POA: Diagnosis not present

## 2017-05-16 DIAGNOSIS — Z1389 Encounter for screening for other disorder: Secondary | ICD-10-CM | POA: Diagnosis not present

## 2017-05-30 DIAGNOSIS — N3090 Cystitis, unspecified without hematuria: Secondary | ICD-10-CM | POA: Diagnosis not present

## 2017-05-30 DIAGNOSIS — N3001 Acute cystitis with hematuria: Secondary | ICD-10-CM | POA: Diagnosis not present

## 2017-06-06 DIAGNOSIS — I10 Essential (primary) hypertension: Secondary | ICD-10-CM | POA: Diagnosis not present

## 2017-06-06 DIAGNOSIS — Z1389 Encounter for screening for other disorder: Secondary | ICD-10-CM | POA: Diagnosis not present

## 2017-06-06 DIAGNOSIS — G4700 Insomnia, unspecified: Secondary | ICD-10-CM | POA: Diagnosis not present

## 2017-06-06 DIAGNOSIS — R002 Palpitations: Secondary | ICD-10-CM | POA: Diagnosis not present

## 2017-08-01 ENCOUNTER — Encounter: Payer: Self-pay | Admitting: Cardiology

## 2017-08-01 ENCOUNTER — Ambulatory Visit (INDEPENDENT_AMBULATORY_CARE_PROVIDER_SITE_OTHER): Payer: Medicare Other | Admitting: Cardiology

## 2017-08-01 DIAGNOSIS — I10 Essential (primary) hypertension: Secondary | ICD-10-CM | POA: Diagnosis not present

## 2017-08-01 DIAGNOSIS — I471 Supraventricular tachycardia, unspecified: Secondary | ICD-10-CM

## 2017-08-01 DIAGNOSIS — R002 Palpitations: Secondary | ICD-10-CM | POA: Diagnosis not present

## 2017-08-01 HISTORY — DX: Supraventricular tachycardia, unspecified: I47.10

## 2017-08-01 HISTORY — DX: Supraventricular tachycardia: I47.1

## 2017-08-01 HISTORY — DX: Essential (primary) hypertension: I10

## 2017-08-01 NOTE — Patient Instructions (Signed)
Medication Instructions:  Your physician recommends that you continue on your current medications as directed. Please refer to the Current Medication list given to you today.   Labwork: Your physician recommends that you return for lab work in: Today - BMET, Magnesium.  Testing/Procedures: Your physician has requested that you have a lexiscan myoview. For further information please visit [email or web address]. Please follow instruction sheet, as given.   This will be Done at [address], [place], [place] [address]   Follow-Up: Your physician recommends that you schedule a follow-up appointment in: 1 month with Dr. Geraldo Pitter   Any Other Special Instructions Will Be Listed Below (If Applicable).     If you need a refill on your cardiac medications before your next appointment, please call your pharmacy.

## 2017-08-01 NOTE — Progress Notes (Signed)
Cardiology Office Note:    Date:  08/01/2017   ID:  Ashley Mcdonald, DOB May 22, 1951, MRN [id number]  PCP:  Myer Peer, MD  Cardiologist:  Jenean Lindau, MD   Referring MD: Myer Peer, MD    ASSESSMENT:    1. Supraventricular tachycardia ([place])   2. Essential hypertension   3. Palpitations    PLAN:    In order of problems listed above:  1. I discussed my findings with the patient at extensive length. She has significant supraventricular tachycardia which is symptomatic and affecting her quality of life. She tells me that she is going to retire soon and wants to have a better. I discussed this with her at length. Her thyroid work up recently was unremarkable. She'll have a Chem-7 and a magnesium level today. She will undergo echocardiogram and Lexiscan. This is to rule out any objective evidence of coronary artery disease in the midst of risk factors. She leads a sedentary lifestyle. I also want to perform these tests as it will help me guide arrhythmia therapy such as the use of flecainide. She does not seem to be tolerating her blood pressure might medications well and once have the reports of the tests, we will be able to adjust her medications accordingly. She knows to go to the nearest emergency room for any significant symptoms and will be seen in follow-up appointment in a month or earlier if she has any concerns.   Medication Adjustments/Labs and Tests Ordered: Current medicines are reviewed at length with the patient today.  Concerns regarding medicines are outlined above.  Orders Placed This Encounter  Procedures  . Basic metabolic panel  . Magnesium  . ECHOCARDIOGRAM COMPLETE   No orders of the defined types were placed in this encounter.    History of Present Illness:    Ashley Mcdonald is a 66 y.o. female who is being seen today for the evaluation of Palpitations at the request of Myer Peer, MD. Patient is a pleasant 66 year old female. She has past  medical history of essential hypertension and palpitations which are affecting her quality of life. She went to the emergency room sometime back for the same reason. She was evaluated and discharged. There was a question of multifocal atrial tachycardia. She saw her primary care physician and underwent event monitoring and this revealed supraventricular tachycardia. The patient has been distressing symptoms affecting her quality of life. She does not have any history of chest pain orthopnea or PND but leads a sedentary lifestyle. She has not had any issues with palpitations causing dizziness or any coffee. At the time of my evaluation she is alert awake oriented and in no distress.  Past Medical History:  Diagnosis Date  . Arthritis   . Complication of anesthesia   . Cystic breast   . Heart murmur    " nothing to be concerned about"  . Hyperlipidemia   . Hypertension   . Norovirus   . Plantar fasciitis 2006   right  . PONV (postoperative nausea and vomiting)   . Puncture wound    right leg  . Shortness of breath dyspnea    with exertion    Past Surgical History:  Procedure Laterality Date  . COLONOSCOPY    . HYSTEROSCOPY     D&C polypectomy  . SPINAL FUSION  2008   c5-7  . TOTAL HIP ARTHROPLASTY Left 03/22/2016   Procedure: TOTAL HIP ARTHROPLASTY ANTERIOR APPROACH;  Surgeon: Frederik Pear, MD;  Location:  [facility];  Service: Orthopedics;  Laterality: Left;    Current Medications: Current Meds  Medication Sig  . Calcium Carbonate (CALCIUM 600 PO) Take by mouth 2 (two) times daily.  . calcium-vitamin D (OSCAL WITH D) 500-200 MG-UNIT tablet Take 1 tablet by mouth 2 (two) times daily.  Marland Kitchen CRANBERRY PO Take 1 tablet by mouth 2 (two) times daily.   . cyclobenzaprine (FLEXERIL) 5 MG tablet Take 5 mg by mouth at bedtime as needed for muscle spasms.   . ergocalciferol (VITAMIN D2) 50000 UNITS capsule Take 50,000 Units by mouth 2 (two) times a week.   Marland Kitchen GLUCOSAMINE CHONDROITIN COMPLX PO Take 1  tablet by mouth daily.   Marland Kitchen ibuprofen (ADVIL,MOTRIN) 200 MG tablet Take 200 mg by mouth every 6 (six) hours as needed for moderate pain.  . Menthol (BIOFREEZE) 10 % AERO Apply 1 application topically daily as needed (Muscle pain).  . metoprolol succinate (TOPROL-XL) 25 MG 24 hr tablet Take 25 mg by mouth daily.  . Nutritional Supplements (JUICE PLUS FIBRE PO) Take 1 Can by mouth daily.   . Omega-3 Fatty Acids (FISH OIL PO) Take 1 tablet by mouth daily.   . Red Yeast Rice 600 MG CAPS Take 600 mg by mouth daily.   Marland Kitchen telmisartan-hydrochlorothiazide (MICARDIS HCT) 40-12.5 MG tablet Take 1 tablet by mouth every morning.      Allergies:   Nsaids   Social History   Social History  . Marital status: Married    Spouse name: N/A  . Number of children: N/A  . Years of education: N/A   Social History Main Topics  . Smoking status: Never Smoker  . Smokeless tobacco: Never Used  . Alcohol use No  . Drug use: No  . Sexual activity: No   Other Topics Concern  . None   Social History Narrative  . None     Family History: The patient's family history includes Cancer in her maternal grandmother; Diabetes in her paternal grandmother; Heart attack in her father; Stroke in her mother.  ROS:   Please see the history of present illness.    All other systems reviewed and are negative.  EKGs/Labs/Other Studies Reviewed:    The following studies were reviewed today: I reviewed records from primary care physician office including event monitor report at extensive length. I discussed this and the emergency room visit and patient had multiple questions which were answered to her satisfaction.   Recent Labs: 05/13/2017: BUN 10; Creatinine, Ser 0.75; Hemoglobin 13.1; Platelets 218; Potassium 4.0; Sodium 141; TSH 1.613  Recent Lipid Panel No results found for: CHOL, TRIG, HDL, CHOLHDL, VLDL, LDLCALC, LDLDIRECT  Physical Exam:    VS:  BP (!) 154/78   Pulse 90   Ht 5\' 3"  (1.6 m)   Wt 170 lb 1.3 oz  (77.1 kg)   LMP 09/12/2002   SpO2 97%   BMI 30.13 kg/m     Wt Readings from Last 3 Encounters:  08/01/17 170 lb 1.3 oz (77.1 kg)  05/13/17 169 lb (76.7 kg)  10/29/16 165 lb (74.8 kg)     GEN: Patient is in no acute distress HEENT: Normal NECK: No JVD; No carotid bruits LYMPHATICS: No lymphadenopathy CARDIAC: S1 S2 regular, 2/6 systolic murmur at the apex. RESPIRATORY:  Clear to auscultation without rales, wheezing or rhonchi  ABDOMEN: Soft, non-tender, non-distended MUSCULOSKELETAL:  No edema; No deformity  SKIN: Warm and dry NEUROLOGIC:  Alert and oriented x 3 PSYCHIATRIC:  Normal affect  Signed, Jenean Lindau, MD  08/01/2017 10:32 AM    [place]

## 2017-08-01 NOTE — Addendum Note (Signed)
Addended by: Orland Penman on: 08/01/2017 10:58 AM   Modules accepted: Orders

## 2017-08-02 LAB — MAGNESIUM: MAGNESIUM: 2.2 mg/dL (ref 1.6–2.3)

## 2017-08-02 LAB — BASIC METABOLIC PANEL
BUN/Creatinine Ratio: 13 (ref 12–28)
BUN: 9 mg/dL (ref 8–27)
CO2: 21 mmol/L (ref 20–29)
Calcium: 9.8 mg/dL (ref 8.7–10.3)
Chloride: 105 mmol/L (ref 96–106)
Creatinine, Ser: 0.68 mg/dL (ref 0.57–1.00)
GFR calc Af Amer: 105 mL/min/{1.73_m2} (ref >59)
GFR calc non Af Amer: 91 mL/min/{1.73_m2} (ref >59)
GLUCOSE: 96 mg/dL (ref 65–99)
POTASSIUM: 4.1 mmol/L (ref 3.5–5.2)
Sodium: 146 mmol/L — ABNORMAL HIGH (ref 134–144)

## 2017-08-09 ENCOUNTER — Telehealth (HOSPITAL_COMMUNITY): Payer: Self-pay | Admitting: *Deleted

## 2017-08-09 NOTE — Telephone Encounter (Signed)
Patient given detailed instructions per Myocardial Perfusion Study Information Sheet for the test on  08/12/17. Patient notified to arrive 15 minutes early and that it is imperative to arrive on time for appointment to keep from having the test rescheduled.  If you need to cancel or reschedule your appointment, please call the office within 24 hours of your appointment. . Patient verbalized understanding. Kirstie Peri

## 2017-08-11 ENCOUNTER — Ambulatory Visit (INDEPENDENT_AMBULATORY_CARE_PROVIDER_SITE_OTHER): Payer: Medicare Other | Admitting: Obstetrics and Gynecology

## 2017-08-11 ENCOUNTER — Telehealth: Payer: Self-pay | Admitting: Certified Nurse Midwife

## 2017-08-11 ENCOUNTER — Encounter: Payer: Self-pay | Admitting: Obstetrics and Gynecology

## 2017-08-11 VITALS — BP 130/80 | HR 72 | Temp 97.9°F | Resp 16 | Wt 169.0 lb

## 2017-08-11 DIAGNOSIS — R102 Pelvic and perineal pain: Secondary | ICD-10-CM | POA: Diagnosis not present

## 2017-08-11 DIAGNOSIS — R300 Dysuria: Secondary | ICD-10-CM | POA: Diagnosis not present

## 2017-08-11 LAB — POCT URINALYSIS DIPSTICK
BILIRUBIN UA: NEGATIVE
Blood, UA: NEGATIVE
Glucose, UA: NEGATIVE
KETONES UA: NEGATIVE
NITRITE UA: NEGATIVE
PH UA: 6 (ref 5.0–8.0)
Protein, UA: NEGATIVE
Urobilinogen, UA: NEGATIVE E.U./dL — AB

## 2017-08-11 MED ORDER — PHENAZOPYRIDINE HCL 200 MG PO TABS: 200.0000 mg | ORAL_TABLET | Freq: Three times a day (TID) | ORAL | 0 refills | Status: DC | PRN

## 2017-08-11 MED ORDER — SULFAMETHOXAZOLE-TRIMETHOPRIM 800-160 MG PO TABS: 1.0000 | ORAL_TABLET | Freq: Two times a day (BID) | ORAL | 0 refills | Status: DC

## 2017-08-11 NOTE — Progress Notes (Signed)
GYNECOLOGY  VISIT   HPI: 66 y.o.   Married  Caucasian  female   [id number] with Patient's last menstrual period was 09/12/2002.   here for dysuria and pelvic pain X 7 days. She was on antibiotics last week for a dental procedure and her dysuria improved. Symptoms have increased in the last few days. Prior to the antibiotics she had urinary frequency, urgency, and dysuria. After the antibiotics (amoxicillin) she felt better. Symptoms started back up 2 days ago. Since then she has noticed some suprapubic pressure, pain when her bladder is full. . No current dysuria, may have had some during the night. No frequency or urgency. No fevers, one ? Chill this am.  No vaginal d/c. No vaginal bleeding.      GYNECOLOGIC HISTORY: Patient's last menstrual period was 09/12/2002. Contraception:postmenopause  Menopausal hormone therapy: none         OB History    Gravida Para Term Preterm AB Living   1 0 0 0 1 0   SAB TAB Ectopic Multiple Live Births                     Patient Active Problem List   Diagnosis Date Noted  . Supraventricular tachycardia ([place]) 08/01/2017  . Essential hypertension 08/01/2017  . Palpitations 08/01/2017  . Arthritis of left hip 03/22/2016  . Primary osteoarthritis of left hip 03/20/2016  . 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    Past Medical History:  Diagnosis Date  . Arthritis   . Complication of anesthesia   . Cystic breast   . Heart murmur    " nothing to be concerned about"  . Hyperlipidemia   . Hypertension   . Norovirus   . Plantar fasciitis 2006   right  . PONV (postoperative nausea and vomiting)   . Puncture wound    right leg  . Shortness of breath dyspnea    with exertion    Past Surgical History:  Procedure Laterality Date  . COLONOSCOPY    . HYSTEROSCOPY     D&C polypectomy  . SPINAL FUSION  2008   c5-7  . TOTAL HIP ARTHROPLASTY Left 03/22/2016   Procedure: TOTAL HIP ARTHROPLASTY ANTERIOR  APPROACH;  Surgeon: Frederik Pear, MD;  Location: [facility];  Service: Orthopedics;  Laterality: Left;    Current Outpatient Prescriptions  Medication Sig Dispense Refill  . Calcium Carbonate (CALCIUM 600 PO) Take by mouth 2 (two) times daily.    . calcium-vitamin D (OSCAL WITH D) 500-200 MG-UNIT tablet Take 1 tablet by mouth 2 (two) times daily.    Marland Kitchen CRANBERRY PO Take 1 tablet by mouth 2 (two) times daily.     . cyclobenzaprine (FLEXERIL) 5 MG tablet Take 5 mg by mouth at bedtime as needed for muscle spasms.     . Menthol (BIOFREEZE) 10 % AERO Apply 1 application topically daily as needed (Muscle pain).    . metoprolol succinate (TOPROL-XL) 25 MG 24 hr tablet Take 25 mg by mouth daily.    . Nutritional Supplements (JUICE PLUS FIBRE PO) Take 1 Can by mouth daily.     . Omega-3 Fatty Acids (FISH OIL PO) Take 1 tablet by mouth daily.     . Red Yeast Rice 600 MG CAPS Take 600 mg by mouth daily.     Marland Kitchen telmisartan-hydrochlorothiazide (MICARDIS HCT) 40-12.5 MG tablet Take 1 tablet by mouth every morning.   3   No current facility-administered medications for this  visit.      ALLERGIES: Nsaids  Family History  Problem Relation Age of Onset  . Stroke Mother   . Heart attack Father   . Cancer Maternal Grandmother        uterine?  . Diabetes Paternal Grandmother     Social History   Social History  . Marital status: Married    Spouse name: N/A  . Number of children: N/A  . Years of education: N/A   Occupational History  . Not on file.   Social History Main Topics  . Smoking status: Never Smoker  . Smokeless tobacco: Never Used  . Alcohol use No  . Drug use: No  . Sexual activity: No   Other Topics Concern  . Not on file   Social History Narrative  . No narrative on file    Review of Systems  Constitutional: Negative.   HENT: Negative.   Eyes: Negative.   Respiratory: Negative.   Cardiovascular: Negative.   Gastrointestinal: Negative.   Genitourinary: Positive for  dysuria.       Pelvic pain   Musculoskeletal: Negative.   Skin: Negative.   Neurological: Negative.   Endo/Heme/Allergies: Negative.   Psychiatric/Behavioral: Negative.     PHYSICAL EXAMINATION:    BP 130/80 (BP Location: Right Arm, Patient Position: Sitting, Cuff Size: Normal)   Pulse 72   Temp 97.9 F (36.6 C)   Resp 16   Wt 169 lb (76.7 kg)   LMP 09/12/2002   BMI 29.94 kg/m     General appearance: alert, cooperative and appears stated age Abdomen: soft, minimally tender in the supra-pubic region, not distended, no masses,  no organomegaly   ASSESSMENT Suspected partially treated UTI    PLAN Bactrim  Pyridium Urine for ua, c&s Call with fever, flank pain or any other concerns Call if not better in 24-48 hours   An After Visit Summary was printed and given to the patient.

## 2017-08-11 NOTE — Telephone Encounter (Signed)
Spoke with patient. Patient is having burning with urination, urinary frequency, low urinary output, lower abdominal pain. Had chills yesterday, none today. Denies fever or lower back pain. Reviewed with Dr.Jertson. Patient to be worked in. Appointment scheduled for today 08/11/2017 at 11:30 am.  Routing to provider for final review. Patient agreeable to disposition. Will close encounter.

## 2017-08-11 NOTE — Telephone Encounter (Signed)
Patient thinks she has a uti and would like an appointment today if possible. To triage for help with scheduling.

## 2017-08-11 NOTE — Patient Instructions (Signed)

## 2017-08-12 ENCOUNTER — Other Ambulatory Visit: Payer: Self-pay

## 2017-08-12 ENCOUNTER — Ambulatory Visit (HOSPITAL_COMMUNITY): Payer: Medicare Other | Attending: Cardiovascular Disease

## 2017-08-12 ENCOUNTER — Ambulatory Visit (HOSPITAL_BASED_OUTPATIENT_CLINIC_OR_DEPARTMENT_OTHER): Payer: Medicare Other

## 2017-08-12 DIAGNOSIS — I5030 Unspecified diastolic (congestive) heart failure: Secondary | ICD-10-CM | POA: Insufficient documentation

## 2017-08-12 DIAGNOSIS — R002 Palpitations: Secondary | ICD-10-CM | POA: Diagnosis not present

## 2017-08-12 DIAGNOSIS — I471 Supraventricular tachycardia: Secondary | ICD-10-CM

## 2017-08-12 DIAGNOSIS — I10 Essential (primary) hypertension: Secondary | ICD-10-CM | POA: Diagnosis not present

## 2017-08-12 DIAGNOSIS — I256 Silent myocardial ischemia: Secondary | ICD-10-CM | POA: Diagnosis not present

## 2017-08-12 LAB — MYOCARDIAL PERFUSION IMAGING
CHL CUP NUCLEAR SSS: 19
CSEPPHR: 94 {beats}/min
LV sys vol: 18 mL
LVDIAVOL: 64 mL (ref 46–106)
RATE: 0.26
Rest HR: 65 {beats}/min
SDS: 11
SRS: 8
TID: 1.01

## 2017-08-12 LAB — URINALYSIS, MICROSCOPIC ONLY
Bacteria, UA: NONE SEEN
Casts: NONE SEEN /lpf

## 2017-08-12 LAB — URINE CULTURE

## 2017-08-12 MED ORDER — PERFLUTREN LIPID MICROSPHERE
1.0000 mL | INTRAVENOUS | Status: AC | PRN
  Administered 2017-08-12: 2 mL via INTRAVENOUS

## 2017-08-12 MED ORDER — TECHNETIUM TC 99M TETROFOSMIN IV KIT
10.4000 | PACK | Freq: Once | INTRAVENOUS | Status: AC | PRN
Start: 2017-08-12 — End: 2017-08-12
  Administered 2017-08-12: 10.4 via INTRAVENOUS
  Filled 2017-08-12: qty 11

## 2017-08-12 MED ORDER — TECHNETIUM TC 99M TETROFOSMIN IV KIT
32.4000 | PACK | Freq: Once | INTRAVENOUS | Status: AC | PRN
  Administered 2017-08-12: 32.4 via INTRAVENOUS
  Filled 2017-08-12: qty 33

## 2017-08-12 MED ORDER — REGADENOSON 0.4 MG/5ML IV SOLN
0.4000 mg | Freq: Once | INTRAVENOUS | Status: AC
  Administered 2017-08-12: 0.4 mg via INTRAVENOUS

## 2017-08-16 ENCOUNTER — Ambulatory Visit: Payer: Medicare Other | Admitting: Cardiology

## 2017-08-16 DIAGNOSIS — Z96642 Presence of left artificial hip joint: Secondary | ICD-10-CM | POA: Diagnosis not present

## 2017-08-16 DIAGNOSIS — M1712 Unilateral primary osteoarthritis, left knee: Secondary | ICD-10-CM | POA: Diagnosis not present

## 2017-08-16 DIAGNOSIS — M7061 Trochanteric bursitis, right hip: Secondary | ICD-10-CM | POA: Diagnosis not present

## 2017-08-19 ENCOUNTER — Encounter: Payer: Self-pay | Admitting: Cardiology

## 2017-08-19 ENCOUNTER — Ambulatory Visit (INDEPENDENT_AMBULATORY_CARE_PROVIDER_SITE_OTHER): Payer: Medicare Other | Admitting: Cardiology

## 2017-08-19 ENCOUNTER — Ambulatory Visit (HOSPITAL_BASED_OUTPATIENT_CLINIC_OR_DEPARTMENT_OTHER)
Admission: RE | Admit: 2017-08-19 | Discharge: 2017-08-19 | Disposition: A | Discharge status: Home / Self Care | Payer: Medicare Other | Source: Ambulatory Visit | Attending: Cardiology | Admitting: Cardiology

## 2017-08-19 VITALS — BP 136/70 | HR 64 | Ht 63.0 in | Wt 174.0 lb

## 2017-08-19 DIAGNOSIS — I10 Essential (primary) hypertension: Secondary | ICD-10-CM | POA: Diagnosis not present

## 2017-08-19 DIAGNOSIS — I471 Supraventricular tachycardia: Secondary | ICD-10-CM

## 2017-08-19 DIAGNOSIS — R931 Abnormal findings on diagnostic imaging of heart and coronary circulation: Secondary | ICD-10-CM | POA: Diagnosis not present

## 2017-08-19 DIAGNOSIS — R000 Tachycardia, unspecified: Secondary | ICD-10-CM | POA: Diagnosis not present

## 2017-08-19 NOTE — Progress Notes (Signed)
Cardiology Office Note:    Date:  08/19/2017   ID:  Ashley Mcdonald, DOB 14-Sep-1951, MRN [id number]  PCP:  Myer Peer, MD  Cardiologist:  Jenean Lindau, MD   Referring MD: Myer Peer, MD    ASSESSMENT:    1. Essential hypertension   2. Supraventricular tachycardia ([place])   3. Abnormal nuclear cardiac imaging test    PLAN:    In order of problems listed above:  1. I discussed my findings with the patient at extensive length.I discussed coronary angiography and left heart catheterization with the patient at extensive length. Procedure, benefits and potential risks were explained. Patient had multiple questions which were answered to the patient's satisfaction. Patient agreed and consented for the procedure. Further recommendations will be made based on the findings of the coronary angiography. In the interim. The patient has any significant symptoms he knows to go to the nearest emergency room. 2. Her coronary angiography report will help me guide antiarrhythmic therapy. She will be seen in follow-up appointment after the coronary angiography for the need for. At this time she will continue her beta blocker.  Medication Adjustments/Labs and Tests Ordered: Current medicines are reviewed at length with the patient today.  Concerns regarding medicines are outlined above.  No orders of the defined types were placed in this encounter.  No orders of the defined types were placed in this encounter.    Chief Complaint  Patient presents with  . Follow-up     History of Present Illness:    Ashley Mcdonald is a 66 y.o. female he patient has history of recurrent supraventricular tachycardia affecting her quality of life. She uses a beta blocker but she has side effects from it and is not happy to continue using it. For this reason I wanted to start an antiarrhythmic therapy and therefore recommended a stress test which is abnormal albeit a low risk stress test.she is here to discuss  about this.  At the of my evaluation she is alert awake oriented and in no distress. She leads a sedentary lifestyle because of orthopedic issues involving her knee and her back.  Past Medical History:  Diagnosis Date  . Arthritis   . Complication of anesthesia   . Cystic breast   . Heart murmur    " nothing to be concerned about"  . Hyperlipidemia   . Hypertension   . Norovirus   . Plantar fasciitis 2006   right  . PONV (postoperative nausea and vomiting)   . Puncture wound    right leg  . Shortness of breath dyspnea    with exertion    Past Surgical History:  Procedure Laterality Date  . COLONOSCOPY    . HYSTEROSCOPY     D&C polypectomy  . SPINAL FUSION  2008   c5-7  . TOTAL HIP ARTHROPLASTY Left 03/22/2016   Procedure: TOTAL HIP ARTHROPLASTY ANTERIOR APPROACH;  Surgeon: Frederik Pear, MD;  Location: [facility];  Service: Orthopedics;  Laterality: Left;    Current Medications: Current Meds  Medication Sig  . Calcium Carbonate (CALCIUM 600 PO) Take by mouth 2 (two) times daily.  . calcium-vitamin D (OSCAL WITH D) 500-200 MG-UNIT tablet Take 1 tablet by mouth 2 (two) times daily.  Marland Kitchen CRANBERRY PO Take 1 tablet by mouth 2 (two) times daily.   . cyclobenzaprine (FLEXERIL) 5 MG tablet Take 5 mg by mouth at bedtime as needed for muscle spasms.   . Menthol (BIOFREEZE) 10 % AERO Apply 1  application topically daily as needed (Muscle pain).  . metoprolol succinate (TOPROL-XL) 25 MG 24 hr tablet Take 25 mg by mouth daily.  . Nutritional Supplements (JUICE PLUS FIBRE PO) Take 1 Can by mouth daily.   . Omega-3 Fatty Acids (FISH OIL PO) Take 1 tablet by mouth daily.   . phenazopyridine (PYRIDIUM) 200 MG tablet Take 1 tablet (200 mg total) by mouth 3 (three) times daily as needed.  . Red Yeast Rice 600 MG CAPS Take 600 mg by mouth daily.   Marland Kitchen sulfamethoxazole-trimethoprim (BACTRIM DS) 800-160 MG tablet Take 1 tablet by mouth 2 (two) times daily. One PO BID x 3 days  .  telmisartan-hydrochlorothiazide (MICARDIS HCT) 40-12.5 MG tablet Take 1 tablet by mouth every morning.      Allergies:   Nsaids   Social History   Social History  . Marital status: Married    Spouse name: N/A  . Number of children: N/A  . Years of education: N/A   Social History Main Topics  . Smoking status: Never Smoker  . Smokeless tobacco: Never Used  . Alcohol use No  . Drug use: No  . Sexual activity: No   Other Topics Concern  . None   Social History Narrative  . None     Family History: The patient's family history includes Cancer in her maternal grandmother; Diabetes in her paternal grandmother; Heart attack in her father; Stroke in her mother.  ROS:   Please see the history of present illness.    All other systems reviewed and are negative.  EKGs/Labs/Other Studies Reviewed:    The following studies were reviewed today: I reviewed stress test with the patient at extensive length and questions were answered to her satisfaction.   Recent Labs: 05/13/2017: Hemoglobin 13.1; Platelets 218; TSH 1.613 08/01/2017: BUN 9; Creatinine, Ser 0.68; Magnesium 2.2; Potassium 4.1; Sodium 146  Recent Lipid Panel No results found for: CHOL, TRIG, HDL, CHOLHDL, VLDL, LDLCALC, LDLDIRECT  Physical Exam:    VS:  BP 136/70   Pulse 64   Ht 5\' 3"  (1.6 m)   Wt 174 lb (78.9 kg)   LMP 09/12/2002   SpO2 97%   BMI 30.82 kg/m     Wt Readings from Last 3 Encounters:  08/19/17 174 lb (78.9 kg)  08/11/17 169 lb (76.7 kg)  08/01/17 170 lb 1.3 oz (77.1 kg)     GEN: Patient is in no acute distress HEENT: Normal NECK: No JVD; No carotid bruits LYMPHATICS: No lymphadenopathy CARDIAC: Hear sounds regular, 2/6 systolic murmur at the apex. RESPIRATORY:  Clear to auscultation without rales, wheezing or rhonchi  ABDOMEN: Soft, non-tender, non-distended MUSCULOSKELETAL:  No edema; No deformity  SKIN: Warm and dry NEUROLOGIC:  Alert and oriented x 3 PSYCHIATRIC:  Normal affect    Signed, Jenean Lindau, MD  08/19/2017 11:46 AM    [place]

## 2017-08-19 NOTE — Patient Instructions (Signed)
Medication Instructions:   Your physician recommends that you continue on your current medications as directed. Please refer to the Current Medication list given to you today.   Labwork:  Your physician recommends that you return for lab work in:  Today ( CBC, BMP, PT/INR)   Testing/Procedures:  A chest x-ray takes a picture of the organs and structures inside the chest, including the heart, lungs, and blood vessels. This test can show several things, including, whether the heart is enlarges; whether fluid is building up in the lungs; and whether pacemaker / defibrillator leads are still in place.    [facility] [address], [place] [address] Dept: [phone] Loc: [place]  08/19/2017  You are scheduled for a Left heart cath on Tuesday, September 11 th with Dr. Irish Lack.   1. Please arrive at the [facility] (Main Entrance A) at [facility]: [address] [place], [place] [address] at 11:30 (two hours before your procedure to ensure your preparation). Free valet parking service is available.   Special note: Every effort is made to have your procedure done on time. Please understand that emergencies sometimes delay scheduled procedures.  2. Diet: Nothing to eat or drink after midnight Monday.   3. Labs: will be done to day   4. Medication instructions in preparation for your procedure:    Current Outpatient Prescriptions (Cardiovascular):  .  metoprolol succinate (TOPROL-XL) 25 MG 24 hr tablet, Take 25 mg by mouth daily. Marland Kitchen  telmisartan-hydrochlorothiazide (MICARDIS HCT) 40-12.5 MG tablet, Take 1 tablet by mouth every morning.      Current Outpatient Prescriptions (Other):  Marland Kitchen  Calcium Carbonate (CALCIUM 600 PO), Take by mouth 2 (two) times daily. .  calcium-vitamin D (OSCAL WITH D) 500-200 MG-UNIT tablet, Take 1 tablet by mouth 2 (two) times  daily. Marland Kitchen  CRANBERRY PO, Take 1 tablet by mouth 2 (two) times daily.  .  cyclobenzaprine (FLEXERIL) 5 MG tablet, Take 5 mg by mouth at bedtime as needed for muscle spasms.  .  Menthol (BIOFREEZE) 10 % AERO, Apply 1 application topically daily as needed (Muscle pain). .  Nutritional Supplements (JUICE PLUS FIBRE PO), Take 1 Can by mouth daily.  .  Omega-3 Fatty Acids (FISH OIL PO), Take 1 tablet by mouth daily.  .  phenazopyridine (PYRIDIUM) 200 MG tablet, Take 1 tablet (200 mg total) by mouth 3 (three) times daily as needed. .  Red Yeast Rice 600 MG CAPS, Take 600 mg by mouth daily.  Marland Kitchen  sulfamethoxazole-trimethoprim (BACTRIM DS) 800-160 MG tablet, Take 1 tablet by mouth 2 (two) times daily. One PO BID x 3 days *For reference purposes while preparing patient instructions.   Delete this med list prior to printing instructions for patient.*       On the morning of your procedure, take your and any morning medicines NOT listed above.  You may use sips of water.  5. Plan for one night stay--bring personal belongings. 6. Bring a current list of your medications and current insurance cards. 7. You MUST have a responsible person to drive you home. 8. Someone MUST be with you the first 24 hours after you arrive home or your discharge will be delayed. 9. Please wear clothes that are easy to get on and off and wear slip-on shoes.  Thank you for allowing [place] to care for you!   -- [facility]  [facility]   Follow-Up: Your physician recommends that you schedule a follow-up appointment in: 4 weeks with Dr. Larina Bras    Any Other Special Instructions Will Be Listed Below (If Applicable).     If you need a refill on your cardiac medications before your next appointment, please call your pharmacy.

## 2017-08-19 NOTE — Addendum Note (Signed)
Addended by: Orland Penman on: 08/19/2017 12:11 PM   Modules accepted: Orders

## 2017-08-20 LAB — BASIC METABOLIC PANEL
BUN/Creatinine Ratio: 22 (ref 12–28)
BUN: 18 mg/dL (ref 8–27)
CALCIUM: 8.9 mg/dL (ref 8.7–10.3)
CHLORIDE: 102 mmol/L (ref 96–106)
CO2: 22 mmol/L (ref 20–29)
Creatinine, Ser: 0.81 mg/dL (ref 0.57–1.00)
GFR calc Af Amer: 88 mL/min/{1.73_m2} (ref >59)
GFR calc non Af Amer: 76 mL/min/{1.73_m2} (ref >59)
Glucose: 117 mg/dL — ABNORMAL HIGH (ref 65–99)
POTASSIUM: 3.7 mmol/L (ref 3.5–5.2)
Sodium: 139 mmol/L (ref 134–144)

## 2017-08-20 LAB — CBC WITH DIFFERENTIAL/PLATELET
BASOS ABS: 0 10*3/uL (ref 0.0–0.2)
BASOS: 0 %
EOS (ABSOLUTE): 0 10*3/uL (ref 0.0–0.4)
Eos: 0 %
Hematocrit: 35.9 % (ref 34.0–46.6)
Hemoglobin: 12 g/dL (ref 11.1–15.9)
IMMATURE GRANS (ABS): 0.1 10*3/uL (ref 0.0–0.1)
Immature Granulocytes: 1 %
LYMPHS: 15 %
Lymphocytes Absolute: 1.1 10*3/uL (ref 0.7–3.1)
MCH: 28 pg (ref 26.6–33.0)
MCHC: 33.4 g/dL (ref 31.5–35.7)
MCV: 84 fL (ref 79–97)
Monocytes Absolute: 0.9 10*3/uL (ref 0.1–0.9)
Monocytes: 12 %
Neutrophils Absolute: 5.3 10*3/uL (ref 1.4–7.0)
Neutrophils: 72 %
PLATELETS: 209 10*3/uL (ref 150–379)
RBC: 4.28 x10E6/uL (ref 3.77–5.28)
RDW: 14.4 % (ref 12.3–15.4)
WBC: 7.3 10*3/uL (ref 3.4–10.8)

## 2017-08-20 LAB — PROTIME-INR
INR: 1 (ref 0.8–1.2)
Prothrombin Time: 10.6 s (ref 9.1–12.0)

## 2017-08-22 ENCOUNTER — Telehealth: Payer: Self-pay

## 2017-08-22 NOTE — Telephone Encounter (Signed)
Left message on home phone per DPR.  Patient contacted pre-catheterization at [facility] scheduled for:  08/23/2017 @ 1330 Verified arrival time and place:  NT @ 1130 Confirmed AM meds to be taken pre-cath with sip of water: Take ASA Hold telmisartan/HCTZ Patient must have responsible person to drive home post procedure and observe patient for 24 hours Addl concerns:  Left this nurse name and # for call back if any addl questions.

## 2017-08-23 ENCOUNTER — Ambulatory Visit (HOSPITAL_COMMUNITY)
Admission: RE | Disposition: A | Discharge status: Home / Self Care | Payer: Self-pay | Source: Ambulatory Visit | Attending: Interventional Cardiology

## 2017-08-23 ENCOUNTER — Other Ambulatory Visit: Payer: Self-pay

## 2017-08-23 ENCOUNTER — Ambulatory Visit (HOSPITAL_COMMUNITY)
Admission: RE | Admit: 2017-08-23 | Discharge: 2017-08-23 | Disposition: A | Discharge status: Home / Self Care | Payer: Medicare Other | Source: Ambulatory Visit | Attending: Interventional Cardiology | Admitting: Interventional Cardiology

## 2017-08-23 DIAGNOSIS — M1990 Unspecified osteoarthritis, unspecified site: Secondary | ICD-10-CM | POA: Diagnosis not present

## 2017-08-23 DIAGNOSIS — R9439 Abnormal result of other cardiovascular function study: Secondary | ICD-10-CM | POA: Diagnosis present

## 2017-08-23 DIAGNOSIS — E785 Hyperlipidemia, unspecified: Secondary | ICD-10-CM | POA: Diagnosis not present

## 2017-08-23 DIAGNOSIS — I2510 Atherosclerotic heart disease of native coronary artery without angina pectoris: Secondary | ICD-10-CM | POA: Insufficient documentation

## 2017-08-23 DIAGNOSIS — I471 Supraventricular tachycardia, unspecified: Secondary | ICD-10-CM | POA: Diagnosis present

## 2017-08-23 DIAGNOSIS — I10 Essential (primary) hypertension: Secondary | ICD-10-CM | POA: Diagnosis present

## 2017-08-23 DIAGNOSIS — M722 Plantar fascial fibromatosis: Secondary | ICD-10-CM | POA: Diagnosis not present

## 2017-08-23 DIAGNOSIS — R931 Abnormal findings on diagnostic imaging of heart and coronary circulation: Secondary | ICD-10-CM | POA: Diagnosis present

## 2017-08-23 DIAGNOSIS — Z981 Arthrodesis status: Secondary | ICD-10-CM | POA: Insufficient documentation

## 2017-08-23 HISTORY — PX: LEFT HEART CATH AND CORONARY ANGIOGRAPHY: CATH118249

## 2017-08-23 SURGERY — LEFT HEART CATH AND CORONARY ANGIOGRAPHY
Anesthesia: LOCAL

## 2017-08-23 MED ORDER — HEPARIN (PORCINE) IN NACL 2-0.9 UNIT/ML-% IJ SOLN
INTRAMUSCULAR | Status: AC
  Filled 2017-08-23: qty 1000

## 2017-08-23 MED ORDER — SODIUM CHLORIDE 0.9 % IV SOLN: 250.0000 mL | INTRAVENOUS | Status: DC | PRN

## 2017-08-23 MED ORDER — VERAPAMIL HCL 2.5 MG/ML IV SOLN
INTRAVENOUS | Status: AC
  Filled 2017-08-23: qty 2

## 2017-08-23 MED ORDER — ASPIRIN 81 MG PO CHEW: 81.0000 mg | CHEWABLE_TABLET | ORAL | Status: DC

## 2017-08-23 MED ORDER — SODIUM CHLORIDE 0.9% FLUSH: 3.0000 mL | INTRAVENOUS | Status: DC | PRN

## 2017-08-23 MED ORDER — FENTANYL CITRATE (PF) 100 MCG/2ML IJ SOLN
INTRAMUSCULAR | Status: DC | PRN
  Administered 2017-08-23 (×2): 50 ug via INTRAVENOUS

## 2017-08-23 MED ORDER — SODIUM CHLORIDE 0.9 % WEIGHT BASED INFUSION
3.0000 mL/kg/h | INTRAVENOUS | Status: AC
  Administered 2017-08-23: 3 mL/kg/h via INTRAVENOUS

## 2017-08-23 MED ORDER — SODIUM CHLORIDE 0.9% FLUSH: 3.0000 mL | Freq: Two times a day (BID) | INTRAVENOUS | Status: DC

## 2017-08-23 MED ORDER — SODIUM CHLORIDE 0.9 % WEIGHT BASED INFUSION: 1.0000 mL/kg/h | INTRAVENOUS | Status: DC

## 2017-08-23 MED ORDER — FENTANYL CITRATE (PF) 100 MCG/2ML IJ SOLN
INTRAMUSCULAR | Status: AC
  Filled 2017-08-23: qty 2

## 2017-08-23 MED ORDER — IOPAMIDOL (ISOVUE-370) INJECTION 76%
INTRAVENOUS | Status: AC
  Filled 2017-08-23: qty 100

## 2017-08-23 MED ORDER — IOPAMIDOL (ISOVUE-370) INJECTION 76%
INTRAVENOUS | Status: DC | PRN
  Administered 2017-08-23: 70 mL via INTRA_ARTERIAL

## 2017-08-23 MED ORDER — LIDOCAINE HCL (PF) 1 % IJ SOLN
INTRAMUSCULAR | Status: DC | PRN
  Administered 2017-08-23: 3 mL

## 2017-08-23 MED ORDER — MIDAZOLAM HCL 2 MG/2ML IJ SOLN
INTRAMUSCULAR | Status: AC
  Filled 2017-08-23: qty 2

## 2017-08-23 MED ORDER — HEPARIN SODIUM (PORCINE) 1000 UNIT/ML IJ SOLN
INTRAMUSCULAR | Status: AC
  Filled 2017-08-23: qty 1

## 2017-08-23 MED ORDER — HEPARIN SODIUM (PORCINE) 1000 UNIT/ML IJ SOLN
INTRAMUSCULAR | Status: DC | PRN
  Administered 2017-08-23: 4000 [IU] via INTRAVENOUS

## 2017-08-23 MED ORDER — LIDOCAINE HCL 2 % IJ SOLN
INTRAMUSCULAR | Status: AC
  Filled 2017-08-23: qty 10

## 2017-08-23 MED ORDER — ACETAMINOPHEN 325 MG PO TABS: 650.0000 mg | ORAL_TABLET | ORAL | Status: DC | PRN

## 2017-08-23 MED ORDER — HEPARIN (PORCINE) IN NACL 2-0.9 UNIT/ML-% IJ SOLN
INTRAMUSCULAR | Status: DC | PRN
  Administered 2017-08-23: 10 mL via INTRA_ARTERIAL

## 2017-08-23 MED ORDER — HEPARIN (PORCINE) IN NACL 2-0.9 UNIT/ML-% IJ SOLN
INTRAMUSCULAR | Status: AC | PRN
  Administered 2017-08-23: 1000 mL

## 2017-08-23 MED ORDER — ONDANSETRON HCL 4 MG/2ML IJ SOLN
4.0000 mg | Freq: Four times a day (QID) | INTRAMUSCULAR | Status: DC | PRN
Start: 2017-08-23 — End: 2017-08-24

## 2017-08-23 MED ORDER — SODIUM CHLORIDE 0.9 % IV SOLN: INTRAVENOUS | Status: DC

## 2017-08-23 MED ORDER — MIDAZOLAM HCL 2 MG/2ML IJ SOLN
INTRAMUSCULAR | Status: DC | PRN
  Administered 2017-08-23 (×3): 1 mg via INTRAVENOUS

## 2017-08-23 SURGICAL SUPPLY — 12 items
CATH INFINITI 4FR JL3.5 (CATHETERS) ×1 IMPLANT
CATH INFINITI 5 FR JL3.5 (CATHETERS) ×1 IMPLANT
CATH INFINITI JR4 5F (CATHETERS) ×1 IMPLANT
DEVICE RAD COMP TR BAND LRG (VASCULAR PRODUCTS) ×1 IMPLANT
GLIDESHEATH SLEND A-KIT 6F 22G (SHEATH) ×1 IMPLANT
GUIDEWIRE INQWIRE 1.5J.035X260 (WIRE) IMPLANT
INQWIRE 1.5J .035X260CM (WIRE) ×2
KIT HEART LEFT (KITS) ×2 IMPLANT
PACK CARDIAC CATHETERIZATION (CUSTOM PROCEDURE TRAY) ×2 IMPLANT
TRANSDUCER W/STOPCOCK (MISCELLANEOUS) ×2 IMPLANT
TUBING CIL FLEX 10 FLL-RA (TUBING) ×2 IMPLANT
WIRE HI TORQ VERSACORE-J 145CM (WIRE) ×1 IMPLANT

## 2017-08-23 NOTE — Interval H&P Note (Signed)
Cath Lab Visit (complete for each Cath Lab visit)  Clinical Evaluation Leading to the Procedure:   ACS: No.  Non-ACS:    Anginal Classification: No Symptoms  Anti-ischemic medical therapy: No Therapy  Non-Invasive Test Results: Low-risk stress test findings: cardiac mortality <1%/year  Prior CABG: No previous CABG      History and Physical Interval Note:  08/23/2017 4:16 PM  Ashley Mcdonald  has presented today for surgery, with the diagnosis of abnormal nuclear stress test  The various methods of treatment have been discussed with the patient and family. After consideration of risks, benefits and other options for treatment, the patient has consented to  Procedure(s): LEFT HEART CATH AND CORONARY ANGIOGRAPHY (N/A) as a surgical intervention .  The patient's history has been reviewed, patient examined, no change in status, stable for surgery.  I have reviewed the patient's chart and labs.  Questions were answered to the patient's satisfaction.     Belva Crome III

## 2017-08-23 NOTE — Discharge Instructions (Signed)

## 2017-08-23 NOTE — H&P (View-Only) (Signed)
Cardiology Office Note:    Date:  08/19/2017   ID:  Ashley Mcdonald, DOB [birth year], MRN [id number]  PCP:  Myer Peer, MD  Cardiologist:  Jenean Lindau, MD   Referring MD: Myer Peer, MD    ASSESSMENT:    1. Essential hypertension   2. Supraventricular tachycardia ([place])   3. Abnormal nuclear cardiac imaging test    PLAN:    In order of problems listed above:  1. I discussed my findings with the patient at extensive length.I discussed coronary angiography and left heart catheterization with the patient at extensive length. Procedure, benefits and potential risks were explained. Patient had multiple questions which were answered to the patient's satisfaction. Patient agreed and consented for the procedure. Further recommendations will be made based on the findings of the coronary angiography. In the interim. The patient has any significant symptoms he knows to go to the nearest emergency room. 2. Her coronary angiography report will help me guide antiarrhythmic therapy. She will be seen in follow-up appointment after the coronary angiography for the need for. At this time she will continue her beta blocker.  Medication Adjustments/Labs and Tests Ordered: Current medicines are reviewed at length with the patient today.  Concerns regarding medicines are outlined above.  No orders of the defined types were placed in this encounter.  No orders of the defined types were placed in this encounter.    Chief Complaint  Patient presents with  . Follow-up     History of Present Illness:    Ashley Mcdonald is a 66 y.o. female he patient has history of recurrent supraventricular tachycardia affecting her quality of life. She uses a beta blocker but she has side effects from it and is not happy to continue using it. For this reason I wanted to start an antiarrhythmic therapy and therefore recommended a stress test which is abnormal albeit a low risk stress test.she is here to discuss  about this.  At the of my evaluation she is alert awake oriented and in no distress. She leads a sedentary lifestyle because of orthopedic issues involving her knee and her back.  Past Medical History:  Diagnosis Date  . Arthritis   . Complication of anesthesia   . Cystic breast   . Heart murmur    " nothing to be concerned about"  . Hyperlipidemia   . Hypertension   . Norovirus   . Plantar fasciitis 2006   right  . PONV (postoperative nausea and vomiting)   . Puncture wound    right leg  . Shortness of breath dyspnea    with exertion    Past Surgical History:  Procedure Laterality Date  . COLONOSCOPY    . HYSTEROSCOPY     D&C polypectomy  . SPINAL FUSION  2008   c5-7  . TOTAL HIP ARTHROPLASTY Left 03/22/2016   Procedure: TOTAL HIP ARTHROPLASTY ANTERIOR APPROACH;  Surgeon: Frederik Pear, MD;  Location: [facility];  Service: Orthopedics;  Laterality: Left;    Current Medications: Current Meds  Medication Sig  . Calcium Carbonate (CALCIUM 600 PO) Take by mouth 2 (two) times daily.  . calcium-vitamin D (OSCAL WITH D) 500-200 MG-UNIT tablet Take 1 tablet by mouth 2 (two) times daily.  Marland Kitchen CRANBERRY PO Take 1 tablet by mouth 2 (two) times daily.   . cyclobenzaprine (FLEXERIL) 5 MG tablet Take 5 mg by mouth at bedtime as needed for muscle spasms.   . Menthol (BIOFREEZE) 10 % AERO Apply 1  application topically daily as needed (Muscle pain).  . metoprolol succinate (TOPROL-XL) 25 MG 24 hr tablet Take 25 mg by mouth daily.  . Nutritional Supplements (JUICE PLUS FIBRE PO) Take 1 Can by mouth daily.   . Omega-3 Fatty Acids (FISH OIL PO) Take 1 tablet by mouth daily.   . phenazopyridine (PYRIDIUM) 200 MG tablet Take 1 tablet (200 mg total) by mouth 3 (three) times daily as needed.  . Red Yeast Rice 600 MG CAPS Take 600 mg by mouth daily.   Marland Kitchen sulfamethoxazole-trimethoprim (BACTRIM DS) 800-160 MG tablet Take 1 tablet by mouth 2 (two) times daily. One PO BID x 3 days  .  telmisartan-hydrochlorothiazide (MICARDIS HCT) 40-12.5 MG tablet Take 1 tablet by mouth every morning.      Allergies:   Nsaids   Social History   Social History  . Marital status: Married    Spouse name: N/A  . Number of children: N/A  . Years of education: N/A   Social History Main Topics  . Smoking status: Never Smoker  . Smokeless tobacco: Never Used  . Alcohol use No  . Drug use: No  . Sexual activity: No   Other Topics Concern  . None   Social History Narrative  . None     Family History: The patient's family history includes Cancer in her maternal grandmother; Diabetes in her paternal grandmother; Heart attack in her father; Stroke in her mother.  ROS:   Please see the history of present illness.    All other systems reviewed and are negative.  EKGs/Labs/Other Studies Reviewed:    The following studies were reviewed today: I reviewed stress test with the patient at extensive length and questions were answered to her satisfaction.   Recent Labs: 05/13/2017: Hemoglobin 13.1; Platelets 218; TSH 1.613 08/01/2017: BUN 9; Creatinine, Ser 0.68; Magnesium 2.2; Potassium 4.1; Sodium 146  Recent Lipid Panel No results found for: CHOL, TRIG, HDL, CHOLHDL, VLDL, LDLCALC, LDLDIRECT  Physical Exam:    VS:  BP 136/70   Pulse 64   Ht 5\' 3"  (1.6 m)   Wt 174 lb (78.9 kg)   LMP 09/12/2002   SpO2 97%   BMI 30.82 kg/m     Wt Readings from Last 3 Encounters:  08/19/17 174 lb (78.9 kg)  08/11/17 169 lb (76.7 kg)  08/01/17 170 lb 1.3 oz (77.1 kg)     GEN: Patient is in no acute distress HEENT: Normal NECK: No JVD; No carotid bruits LYMPHATICS: No lymphadenopathy CARDIAC: Hear sounds regular, 2/6 systolic murmur at the apex. RESPIRATORY:  Clear to auscultation without rales, wheezing or rhonchi  ABDOMEN: Soft, non-tender, non-distended MUSCULOSKELETAL:  No edema; No deformity  SKIN: Warm and dry NEUROLOGIC:  Alert and oriented x 3 PSYCHIATRIC:  Normal affect    Signed, Jenean Lindau, MD  08/19/2017 11:46 AM    [place]

## 2017-08-24 ENCOUNTER — Encounter (HOSPITAL_COMMUNITY): Payer: Self-pay | Admitting: Interventional Cardiology

## 2017-08-25 DIAGNOSIS — R29810 Facial weakness: Secondary | ICD-10-CM | POA: Diagnosis not present

## 2017-08-25 DIAGNOSIS — I6789 Other cerebrovascular disease: Secondary | ICD-10-CM | POA: Diagnosis not present

## 2017-08-25 MED FILL — Lidocaine HCl Local Inj 2%: INTRAMUSCULAR | Qty: 10 | Status: AC

## 2017-09-05 ENCOUNTER — Ambulatory Visit: Payer: Medicare Other | Admitting: Cardiology

## 2017-09-10 DIAGNOSIS — N3001 Acute cystitis with hematuria: Secondary | ICD-10-CM | POA: Diagnosis not present

## 2017-09-10 DIAGNOSIS — N3090 Cystitis, unspecified without hematuria: Secondary | ICD-10-CM | POA: Diagnosis not present

## 2017-09-16 ENCOUNTER — Encounter: Payer: Self-pay | Admitting: Cardiology

## 2017-09-16 ENCOUNTER — Ambulatory Visit (INDEPENDENT_AMBULATORY_CARE_PROVIDER_SITE_OTHER): Payer: Medicare Other | Admitting: Cardiology

## 2017-09-16 VITALS — BP 102/68 | HR 67 | Ht 63.0 in | Wt 170.0 lb

## 2017-09-16 DIAGNOSIS — I10 Essential (primary) hypertension: Secondary | ICD-10-CM | POA: Diagnosis not present

## 2017-09-16 DIAGNOSIS — I2510 Atherosclerotic heart disease of native coronary artery without angina pectoris: Secondary | ICD-10-CM | POA: Diagnosis not present

## 2017-09-16 HISTORY — DX: Atherosclerotic heart disease of native coronary artery without angina pectoris: I25.10

## 2017-09-16 MED ORDER — ASPIRIN EC 81 MG PO TBEC: 81.0000 mg | DELAYED_RELEASE_TABLET | Freq: Every day | ORAL | 3 refills | Status: DC

## 2017-09-16 NOTE — Progress Notes (Signed)
Cardiology Office Note:    Date:  09/16/2017   ID:  Ashley Mcdonald, DOB Jun 21, 1951, MRN [id number]  PCP:  Myer Peer, MD  Cardiologist:  Jenean Lindau, MD   Referring MD: Myer Peer, MD    ASSESSMENT:    1. Coronary artery disease involving native coronary artery of native heart without angina pectoris   2. Essential hypertension    PLAN:    In order of problems listed above:  1. Secondary prevention stressed to the patient. Importance of compliance with diet and medications stressed and she verbalized understanding. Importance of regular exercise and risks of obesity explained and she plans to enter exercise program and lose weight. Diet was discussed. 2. Her blood pressure stable 3. I told him to get her blood work done in [place] with a primary care physician in the next few days including fasting lipids. She was skeptical about lipid lowering medications but had a lengthy discussion with her about these findings based on her lipids are like to start an milligrams of atorvastatin and I received those numbers. Benefits and potential this explained and she vocalized understanding. She will be seen in follow-up appointment in 6 months or earlier if she has any concerns.   Medication Adjustments/Labs and Tests Ordered: Current medicines are reviewed at length with the patient today.  Concerns regarding medicines are outlined above.  No orders of the defined types were placed in this encounter.  No orders of the defined types were placed in this encounter.    Chief Complaint  Patient presents with  . Follow-up    post cath      History of Present Illness:    Ashley Mcdonald is a 66 y.o. female . Patient was evaluated by me for abnormal stress test and underwent coronary angiography.  Coronary angiography revealed significant plaque burden in the left anterior descending artery without any obstructive stenosis. She denies any problems at this time and takes care of  activities of daily living. No chest pain orthopnea or PND. At the time of my evaluation is alert awake oriented and in no distress and she is happy about the results of stress test.  Past Medical History:  Diagnosis Date  . Arthritis   . Complication of anesthesia   . Cystic breast   . Heart murmur    " nothing to be concerned about"  . Hyperlipidemia   . Hypertension   . Norovirus   . Plantar fasciitis 2006   right  . PONV (postoperative nausea and vomiting)   . Puncture wound    right leg  . Shortness of breath dyspnea    with exertion    Past Surgical History:  Procedure Laterality Date  . COLONOSCOPY    . HYSTEROSCOPY     D&C polypectomy  . LEFT HEART CATH AND CORONARY ANGIOGRAPHY N/A 08/23/2017   Procedure: LEFT HEART CATH AND CORONARY ANGIOGRAPHY;  Surgeon: Belva Crome, MD;  Location: [facility] [facility];  Service: Cardiovascular;  Laterality: N/A;  . SPINAL FUSION  2008   c5-7  . TOTAL HIP ARTHROPLASTY Left 03/22/2016   Procedure: TOTAL HIP ARTHROPLASTY ANTERIOR APPROACH;  Surgeon: Frederik Pear, MD;  Location: [facility];  Service: Orthopedics;  Laterality: Left;    Current Medications: Current Meds  Medication Sig  . acetaminophen (TYLENOL) 650 MG CR tablet Take 650 mg by mouth at bedtime as needed for pain.  . Calcium Carbonate-Vitamin D (CALCIUM-D PO) Take 3 tablets by mouth every  evening.  Marland Kitchen CRANBERRY PO Take 1 tablet by mouth daily as needed (uti symptoms).   . cyclobenzaprine (FLEXERIL) 5 MG tablet Take 5 mg by mouth daily as needed for muscle spasms.  Marland Kitchen HYDROcodone-acetaminophen (NORCO/VICODIN) 5-325 MG tablet Take 1 tablet by mouth at bedtime as needed for moderate pain.  . Menthol (BIOFREEZE) 10 % AERO Apply 1 application topically daily as needed (Muscle pain).  . metoprolol succinate (TOPROL-XL) 25 MG 24 hr tablet Take 25 mg by mouth at bedtime.   . Nutritional Supplements (JUICE PLUS FIBRE PO) Juice plus Veggie  1 tablet by mouth twice daily  Juice plus  Fruit   1 tablet by mouth twice daily  Juice plus Vineyard 1 tablet by mouth twice daily  . Omega-3 Fatty Acids (FISH OIL PO) Take 1 capsule by mouth daily.  . Red Yeast Rice 600 MG CAPS Take 600 mg by mouth at bedtime.   . sulfamethoxazole-trimethoprim (BACTRIM DS) 800-160 MG tablet Take 1 tablet by mouth 2 (two) times daily. One PO BID x 3 days  . telmisartan-hydrochlorothiazide (MICARDIS HCT) 40-12.5 MG tablet Take 1 tablet by mouth every morning.      Allergies:   Nsaids   Social History   Social History  . Marital status: Married    Spouse name: N/A  . Number of children: N/A  . Years of education: N/A   Social History Main Topics  . Smoking status: Never Smoker  . Smokeless tobacco: Never Used  . Alcohol use No  . Drug use: No  . Sexual activity: No   Other Topics Concern  . None   Social History Narrative  . None     Family History: The patient's family history includes Cancer in her maternal grandmother; Diabetes in her paternal grandmother; Heart attack in her father; Stroke in her mother.  ROS:   Please see the history of present illness.    All other systems reviewed and are negative.  EKGs/Labs/Other Studies Reviewed:    The following studies were reviewed today: I discussed findings with coronary angiography report with the patient at extensive length and she verbalized understanding.   Recent Labs: 05/13/2017: TSH 1.613 08/01/2017: Magnesium 2.2 08/19/2017: BUN 18; Creatinine, Ser 0.81; Hemoglobin 12.0; Platelets 209; Potassium 3.7; Sodium 139  Recent Lipid Panel No results found for: CHOL, TRIG, HDL, CHOLHDL, VLDL, LDLCALC, LDLDIRECT  Physical Exam:    VS:  BP 102/68   Pulse 67   Ht 5\' 3"  (1.6 m)   Wt 170 lb (77.1 kg)   LMP 09/12/2002   SpO2 94%   BMI 30.11 kg/m     Wt Readings from Last 3 Encounters:  09/16/17 170 lb (77.1 kg)  08/23/17 169 lb (76.7 kg)  08/19/17 174 lb (78.9 kg)     GEN: Patient is in no acute distress HEENT:  Normal NECK: No JVD; No carotid bruits LYMPHATICS: No lymphadenopathy CARDIAC: Hear sounds regular, 2/6 systolic murmur at the apex. RESPIRATORY:  Clear to auscultation without rales, wheezing or rhonchi  ABDOMEN: Soft, non-tender, non-distended MUSCULOSKELETAL:  No edema; No deformity  SKIN: Warm and dry NEUROLOGIC:  Alert and oriented x 3 PSYCHIATRIC:  Normal affect   Signed, Jenean Lindau, MD  09/16/2017 11:12 AM    [place]

## 2017-09-16 NOTE — Patient Instructions (Signed)
Medication Instructions:  START ASA 81mg   Labwork: None  Testing/Procedures: None  Follow-Up: 6 months  Any Other Special Instructions Will Be Listed Below (If Applicable).     If you need a refill on your cardiac medications before your next appointment, please call your pharmacy.

## 2017-09-23 DIAGNOSIS — E782 Mixed hyperlipidemia: Secondary | ICD-10-CM | POA: Diagnosis not present

## 2017-09-23 DIAGNOSIS — Z23 Encounter for immunization: Secondary | ICD-10-CM | POA: Diagnosis not present

## 2017-09-23 DIAGNOSIS — R7301 Impaired fasting glucose: Secondary | ICD-10-CM | POA: Diagnosis not present

## 2017-09-26 ENCOUNTER — Telehealth: Payer: Self-pay

## 2017-09-26 NOTE — Telephone Encounter (Signed)
Informed patient that Dr. Geraldo Pitter would like her to watch her diet considering her recent lipid panel sent from [place]. Stressed the importance of diet compliance with patient. Patient was understanding and did not have any further questions or concerns. Mattie Marlin

## 2017-09-30 DIAGNOSIS — D485 Neoplasm of uncertain behavior of skin: Secondary | ICD-10-CM | POA: Diagnosis not present

## 2017-09-30 DIAGNOSIS — L814 Other melanin hyperpigmentation: Secondary | ICD-10-CM | POA: Diagnosis not present

## 2017-09-30 DIAGNOSIS — D2239 Melanocytic nevi of other parts of face: Secondary | ICD-10-CM | POA: Diagnosis not present

## 2017-09-30 DIAGNOSIS — L820 Inflamed seborrheic keratosis: Secondary | ICD-10-CM | POA: Diagnosis not present

## 2017-09-30 DIAGNOSIS — D225 Melanocytic nevi of trunk: Secondary | ICD-10-CM | POA: Diagnosis not present

## 2017-09-30 DIAGNOSIS — C44629 Squamous cell carcinoma of skin of left upper limb, including shoulder: Secondary | ICD-10-CM | POA: Diagnosis not present

## 2017-10-11 DIAGNOSIS — M546 Pain in thoracic spine: Secondary | ICD-10-CM | POA: Diagnosis not present

## 2017-10-11 DIAGNOSIS — M9902 Segmental and somatic dysfunction of thoracic region: Secondary | ICD-10-CM | POA: Diagnosis not present

## 2017-10-11 DIAGNOSIS — M5136 Other intervertebral disc degeneration, lumbar region: Secondary | ICD-10-CM | POA: Diagnosis not present

## 2017-10-11 DIAGNOSIS — M5442 Lumbago with sciatica, left side: Secondary | ICD-10-CM | POA: Diagnosis not present

## 2017-10-11 DIAGNOSIS — M9903 Segmental and somatic dysfunction of lumbar region: Secondary | ICD-10-CM | POA: Diagnosis not present

## 2017-10-14 DIAGNOSIS — M5136 Other intervertebral disc degeneration, lumbar region: Secondary | ICD-10-CM | POA: Diagnosis not present

## 2017-10-14 DIAGNOSIS — M546 Pain in thoracic spine: Secondary | ICD-10-CM | POA: Diagnosis not present

## 2017-10-14 DIAGNOSIS — M9902 Segmental and somatic dysfunction of thoracic region: Secondary | ICD-10-CM | POA: Diagnosis not present

## 2017-10-14 DIAGNOSIS — M5442 Lumbago with sciatica, left side: Secondary | ICD-10-CM | POA: Diagnosis not present

## 2017-10-14 DIAGNOSIS — M9903 Segmental and somatic dysfunction of lumbar region: Secondary | ICD-10-CM | POA: Diagnosis not present

## 2017-10-17 DIAGNOSIS — M546 Pain in thoracic spine: Secondary | ICD-10-CM | POA: Diagnosis not present

## 2017-10-17 DIAGNOSIS — M5442 Lumbago with sciatica, left side: Secondary | ICD-10-CM | POA: Diagnosis not present

## 2017-10-17 DIAGNOSIS — M9902 Segmental and somatic dysfunction of thoracic region: Secondary | ICD-10-CM | POA: Diagnosis not present

## 2017-10-17 DIAGNOSIS — M9903 Segmental and somatic dysfunction of lumbar region: Secondary | ICD-10-CM | POA: Diagnosis not present

## 2017-10-17 DIAGNOSIS — M5136 Other intervertebral disc degeneration, lumbar region: Secondary | ICD-10-CM | POA: Diagnosis not present

## 2017-10-18 DIAGNOSIS — M5136 Other intervertebral disc degeneration, lumbar region: Secondary | ICD-10-CM | POA: Diagnosis not present

## 2017-10-18 DIAGNOSIS — M9903 Segmental and somatic dysfunction of lumbar region: Secondary | ICD-10-CM | POA: Diagnosis not present

## 2017-10-18 DIAGNOSIS — M546 Pain in thoracic spine: Secondary | ICD-10-CM | POA: Diagnosis not present

## 2017-10-18 DIAGNOSIS — M9902 Segmental and somatic dysfunction of thoracic region: Secondary | ICD-10-CM | POA: Diagnosis not present

## 2017-10-18 DIAGNOSIS — M5442 Lumbago with sciatica, left side: Secondary | ICD-10-CM | POA: Diagnosis not present

## 2017-10-21 DIAGNOSIS — M9902 Segmental and somatic dysfunction of thoracic region: Secondary | ICD-10-CM | POA: Diagnosis not present

## 2017-10-21 DIAGNOSIS — M5136 Other intervertebral disc degeneration, lumbar region: Secondary | ICD-10-CM | POA: Diagnosis not present

## 2017-10-21 DIAGNOSIS — M9903 Segmental and somatic dysfunction of lumbar region: Secondary | ICD-10-CM | POA: Diagnosis not present

## 2017-10-21 DIAGNOSIS — M5442 Lumbago with sciatica, left side: Secondary | ICD-10-CM | POA: Diagnosis not present

## 2017-10-21 DIAGNOSIS — M546 Pain in thoracic spine: Secondary | ICD-10-CM | POA: Diagnosis not present

## 2017-10-31 DIAGNOSIS — M9902 Segmental and somatic dysfunction of thoracic region: Secondary | ICD-10-CM | POA: Diagnosis not present

## 2017-10-31 DIAGNOSIS — M5442 Lumbago with sciatica, left side: Secondary | ICD-10-CM | POA: Diagnosis not present

## 2017-10-31 DIAGNOSIS — M9903 Segmental and somatic dysfunction of lumbar region: Secondary | ICD-10-CM | POA: Diagnosis not present

## 2017-10-31 DIAGNOSIS — M546 Pain in thoracic spine: Secondary | ICD-10-CM | POA: Diagnosis not present

## 2017-10-31 DIAGNOSIS — M5136 Other intervertebral disc degeneration, lumbar region: Secondary | ICD-10-CM | POA: Diagnosis not present

## 2017-11-01 DIAGNOSIS — M9903 Segmental and somatic dysfunction of lumbar region: Secondary | ICD-10-CM | POA: Diagnosis not present

## 2017-11-01 DIAGNOSIS — M546 Pain in thoracic spine: Secondary | ICD-10-CM | POA: Diagnosis not present

## 2017-11-01 DIAGNOSIS — M5442 Lumbago with sciatica, left side: Secondary | ICD-10-CM | POA: Diagnosis not present

## 2017-11-01 DIAGNOSIS — M5136 Other intervertebral disc degeneration, lumbar region: Secondary | ICD-10-CM | POA: Diagnosis not present

## 2017-11-01 DIAGNOSIS — M9902 Segmental and somatic dysfunction of thoracic region: Secondary | ICD-10-CM | POA: Diagnosis not present

## 2017-11-07 DIAGNOSIS — M9902 Segmental and somatic dysfunction of thoracic region: Secondary | ICD-10-CM | POA: Diagnosis not present

## 2017-11-07 DIAGNOSIS — M9903 Segmental and somatic dysfunction of lumbar region: Secondary | ICD-10-CM | POA: Diagnosis not present

## 2017-11-07 DIAGNOSIS — M5136 Other intervertebral disc degeneration, lumbar region: Secondary | ICD-10-CM | POA: Diagnosis not present

## 2017-11-07 DIAGNOSIS — M5442 Lumbago with sciatica, left side: Secondary | ICD-10-CM | POA: Diagnosis not present

## 2017-11-07 DIAGNOSIS — M546 Pain in thoracic spine: Secondary | ICD-10-CM | POA: Diagnosis not present

## 2017-11-08 ENCOUNTER — Other Ambulatory Visit: Payer: Self-pay

## 2017-11-08 ENCOUNTER — Encounter: Payer: Self-pay | Admitting: Certified Nurse Midwife

## 2017-11-08 ENCOUNTER — Ambulatory Visit (INDEPENDENT_AMBULATORY_CARE_PROVIDER_SITE_OTHER): Payer: Medicare Other | Admitting: Certified Nurse Midwife

## 2017-11-08 VITALS — BP 120/80 | HR 72 | Resp 16 | Ht 62.75 in | Wt 169.0 lb

## 2017-11-08 DIAGNOSIS — Z124 Encounter for screening for malignant neoplasm of cervix: Secondary | ICD-10-CM | POA: Diagnosis not present

## 2017-11-08 DIAGNOSIS — N951 Menopausal and female climacteric states: Secondary | ICD-10-CM | POA: Diagnosis not present

## 2017-11-08 DIAGNOSIS — N952 Postmenopausal atrophic vaginitis: Secondary | ICD-10-CM | POA: Diagnosis not present

## 2017-11-08 DIAGNOSIS — Z01419 Encounter for gynecological examination (general) (routine) without abnormal findings: Secondary | ICD-10-CM

## 2017-11-08 DIAGNOSIS — M546 Pain in thoracic spine: Secondary | ICD-10-CM | POA: Diagnosis not present

## 2017-11-08 DIAGNOSIS — M5136 Other intervertebral disc degeneration, lumbar region: Secondary | ICD-10-CM | POA: Diagnosis not present

## 2017-11-08 DIAGNOSIS — M5442 Lumbago with sciatica, left side: Secondary | ICD-10-CM | POA: Diagnosis not present

## 2017-11-08 DIAGNOSIS — M9903 Segmental and somatic dysfunction of lumbar region: Secondary | ICD-10-CM | POA: Diagnosis not present

## 2017-11-08 DIAGNOSIS — M9902 Segmental and somatic dysfunction of thoracic region: Secondary | ICD-10-CM | POA: Diagnosis not present

## 2017-11-08 NOTE — Progress Notes (Signed)
66 y.o. G1P0010 Married  Caucasian Fe here for annual exam.. Menopausal no HRT. Denies vaginal bleeding. Having some back trouble, seeing [facility] Dr. Madaline Brilliant, which is helping. Seeing cardiology for tachycardia now with Dr. Doreatha Massed work up with catherization, which is normal. Has stopped all caffeine, which has helped. Cardiology and PCP are managing cholesterol, tachycardia, hypertension medications and labs. Feels better on medication and has not noted anymore episodes with rapid heartbeat. Working only 2 days a week now and that is working well. Has lost weight and this has also helped with back trouble. No other health issues today. [place] the [place] 2 [place]!  Patient's last menstrual period was 09/12/2002.          Sexually active: Yes.    The current method of family planning is post menopausal status.    Exercising: No.  exercise Smoker:  no  Health Maintenance: Pap:  10-18-14 neg, 10-29-16 neg History of Abnormal Pap: no MMG: bilateral 12/17 &  12-17-16 rt breast category c density birads 2:neg Self Breast exams: yes Colonoscopy:  2017 f/u 9yrs BMD:   2017 normal TDaP:  2017 Shingles: had done Pneumonia: had done Hep C and HIV: both neg 2017 Labs: yes   reports that  has never smoked. she has never used smokeless tobacco. She reports that she does not drink alcohol or use drugs.  Past Medical History:  Diagnosis Date  . Arthritis   . Complication of anesthesia   . Cystic breast   . Heart murmur    " nothing to be concerned about"  . Hyperlipidemia   . Hypertension   . Norovirus   . Plantar fasciitis 2006   right  . PONV (postoperative nausea and vomiting)   . Puncture wound    right leg  . Shortness of breath dyspnea    with exertion    Past Surgical History:  Procedure Laterality Date  . COLONOSCOPY    . HYSTEROSCOPY     D&C polypectomy  . LEFT HEART CATH AND CORONARY ANGIOGRAPHY N/A 08/23/2017   Procedure: LEFT HEART CATH AND CORONARY  ANGIOGRAPHY;  Surgeon: Belva Crome, MD;  Location: [place] [facility];  Service: Cardiovascular;  Laterality: N/A;  . SPINAL FUSION  2008   c5-7  . TOTAL HIP ARTHROPLASTY Left 03/22/2016   Procedure: TOTAL HIP ARTHROPLASTY ANTERIOR APPROACH;  Surgeon: Frederik Pear, MD;  Location: [place];  Service: Orthopedics;  Laterality: Left;    Current Outpatient Medications  Medication Sig Dispense Refill  . acetaminophen (TYLENOL) 650 MG CR tablet Take 650 mg by mouth at bedtime as needed for pain.    Marland Kitchen aspirin EC 81 MG tablet Take 1 tablet (81 mg total) by mouth daily. 90 tablet 3  . Calcium Carbonate-Vitamin D (CALCIUM-D PO) Take 3 tablets by mouth every evening.    Marland Kitchen CRANBERRY PO Take 1 tablet by mouth daily.     . cyclobenzaprine (FLEXERIL) 5 MG tablet Take 5 mg by mouth daily as needed for muscle spasms.    Marland Kitchen HYDROcodone-acetaminophen (NORCO/VICODIN) 5-325 MG tablet Take 1 tablet by mouth at bedtime as needed for moderate pain.    . Menthol (BIOFREEZE) 10 % AERO Apply 1 application topically daily as needed (Muscle pain).    . metoprolol succinate (TOPROL-XL) 25 MG 24 hr tablet Take 25 mg by mouth at bedtime.     . Nutritional Supplements (JUICE PLUS FIBRE PO) Juice plus Veggie  1 tablet by mouth twice daily  Juice plus Fruit   1 tablet by mouth twice daily  Juice plus Vineyard 1 tablet by mouth twice daily    . Omega-3 Fatty Acids (FISH OIL PO) Take 1 capsule by mouth daily.    . Red Yeast Rice 600 MG CAPS Take 600 mg by mouth at bedtime.     Marland Kitchen telmisartan-hydrochlorothiazide (MICARDIS HCT) 40-12.5 MG tablet Take 1 tablet by mouth every morning.   3   No current facility-administered medications for this visit.     Family History  Problem Relation Age of Onset  . Stroke Mother   . Heart attack Father   . Cancer Maternal Grandmother        uterine?  . Diabetes Paternal Grandmother     ROS:  Pertinent items are noted in HPI.  Otherwise, a comprehensive ROS was negative.  Exam:   BP  120/80   Pulse 72   Resp 16   Ht 5' 2.75" (1.594 m)   Wt 169 lb (76.7 kg)   LMP 09/12/2002   BMI 30.18 kg/m  Height: 5' 2.75" (159.4 cm) Ht Readings from Last 3 Encounters:  11/08/17 5' 2.75" (1.594 m)  09/16/17 5\' 3"  (1.6 m)  08/23/17 5\' 3"  (1.6 m)    General appearance: alert, cooperative and appears stated age Head: Normocephalic, without obvious abnormality, atraumatic Neck: no adenopathy, supple, symmetrical, trachea midline and thyroid normal to inspection and palpation Lungs: clear to auscultation bilaterally Breasts: normal appearance, no masses or tenderness, No nipple retraction or dimpling, No nipple discharge or bleeding, No axillary or supraclavicular adenopathy Heart: regular rate and rhythm Abdomen: soft, non-tender; no masses,  no organomegaly Extremities: extremities normal, atraumatic, no cyanosis or edema Skin: Skin color, texture, turgor normal. No rashes or lesions Lymph nodes: Cervical, supraclavicular, and axillary nodes normal. No abnormal inguinal nodes palpated Neurologic: Grossly normal   Pelvic: External genitalia:  no lesions, normal female with atrophic changes.              Urethra:  normal appearing urethra with no masses, tenderness or lesions              Bartholin's and Skene's: normal                 Vagina: normal appearing vagina with normal color and discharge, no lesions              Cervix: no cervical motion tenderness, no lesions and nulliparous appearance              Pap taken: No. Bimanual Exam:  Uterus:  normal size, contour, position, consistency, mobility, non-tender              Adnexa: normal adnexa and no mass, fullness, tenderness               Rectovaginal: Confirms               Anus:  normal sphincter tone, no lesions  Chaperone present: yes  A:  Well Woman with normal exam  Menopausal no HRT  Atrophic vaginitis would like to try estrogen cream or tablets  Hypertension/SVT/and cholesterol management with MD  P:    Reviewed health and wellness  pertinent to exam.  Aware of need to advise if vaginal bleeding.  Discussed risks and benefits of estrogen use and warning signs. Discussed absorption does also occur systemically but small amount. Patient to check with Cardiology regarding use. Will need note regarding use from MD. Patient will call regarding  and advise. Will continue use coconut oil at this point.  Continue follow up with PCP as indicated.  Pap smear: no   counseled on breast self exam, mammography screening, feminine hygiene, menopause, adequate intake of calcium and vitamin D, diet and exercise  return annually or prn  An After Visit Summary was printed and given to the patient.

## 2017-11-08 NOTE — Patient Instructions (Addendum)

## 2017-11-09 ENCOUNTER — Telehealth: Payer: Self-pay | Admitting: Cardiology

## 2017-11-09 NOTE — Telephone Encounter (Signed)
Please call patient regarding new meds that her Estill Bamberg wants to put her on and she wants to make sure ok.Ashley Mcdonald

## 2017-11-09 NOTE — Telephone Encounter (Signed)
Voicemail left for the patient to call the office.

## 2017-11-10 NOTE — Telephone Encounter (Signed)
Attempted to call the patient again with no answer.

## 2017-11-11 DIAGNOSIS — M5442 Lumbago with sciatica, left side: Secondary | ICD-10-CM | POA: Diagnosis not present

## 2017-11-11 DIAGNOSIS — M546 Pain in thoracic spine: Secondary | ICD-10-CM | POA: Diagnosis not present

## 2017-11-11 DIAGNOSIS — M9903 Segmental and somatic dysfunction of lumbar region: Secondary | ICD-10-CM | POA: Diagnosis not present

## 2017-11-11 DIAGNOSIS — M9902 Segmental and somatic dysfunction of thoracic region: Secondary | ICD-10-CM | POA: Diagnosis not present

## 2017-11-11 DIAGNOSIS — M5136 Other intervertebral disc degeneration, lumbar region: Secondary | ICD-10-CM | POA: Diagnosis not present

## 2017-11-14 ENCOUNTER — Telehealth: Payer: Self-pay | Admitting: Cardiology

## 2017-11-14 DIAGNOSIS — M5442 Lumbago with sciatica, left side: Secondary | ICD-10-CM | POA: Diagnosis not present

## 2017-11-14 DIAGNOSIS — M9903 Segmental and somatic dysfunction of lumbar region: Secondary | ICD-10-CM | POA: Diagnosis not present

## 2017-11-14 DIAGNOSIS — M5136 Other intervertebral disc degeneration, lumbar region: Secondary | ICD-10-CM | POA: Diagnosis not present

## 2017-11-14 DIAGNOSIS — M546 Pain in thoracic spine: Secondary | ICD-10-CM | POA: Diagnosis not present

## 2017-11-14 DIAGNOSIS — M9902 Segmental and somatic dysfunction of thoracic region: Secondary | ICD-10-CM | POA: Diagnosis not present

## 2017-11-14 IMAGING — CR DG CHEST 2V
2 series · 2 of 2 positions shown · non-contrast
Comparison: March 09, 2016

CLINICAL DATA: Cardiac palpitations

EXAM:
CHEST  2 VIEW

[chest pa]
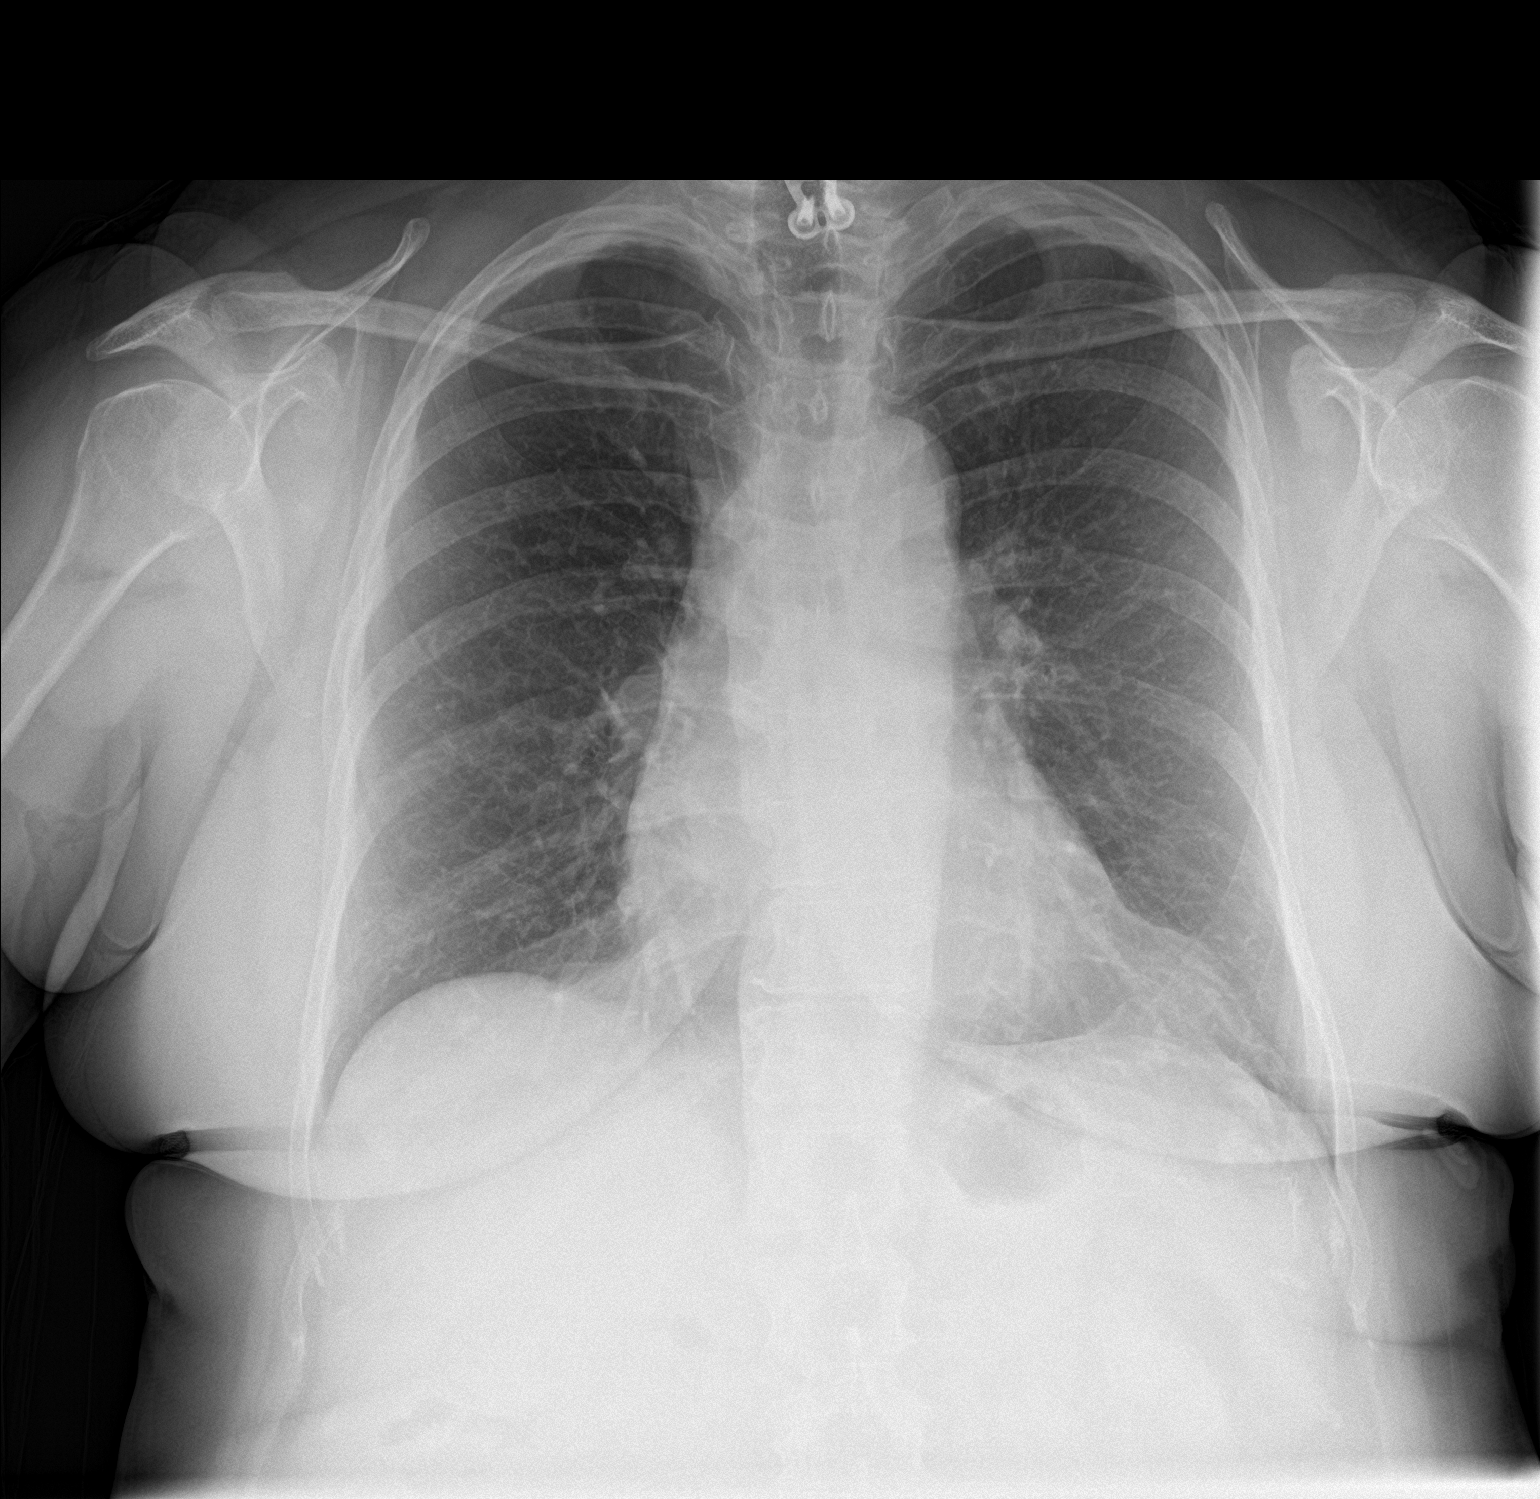

[chest lat]
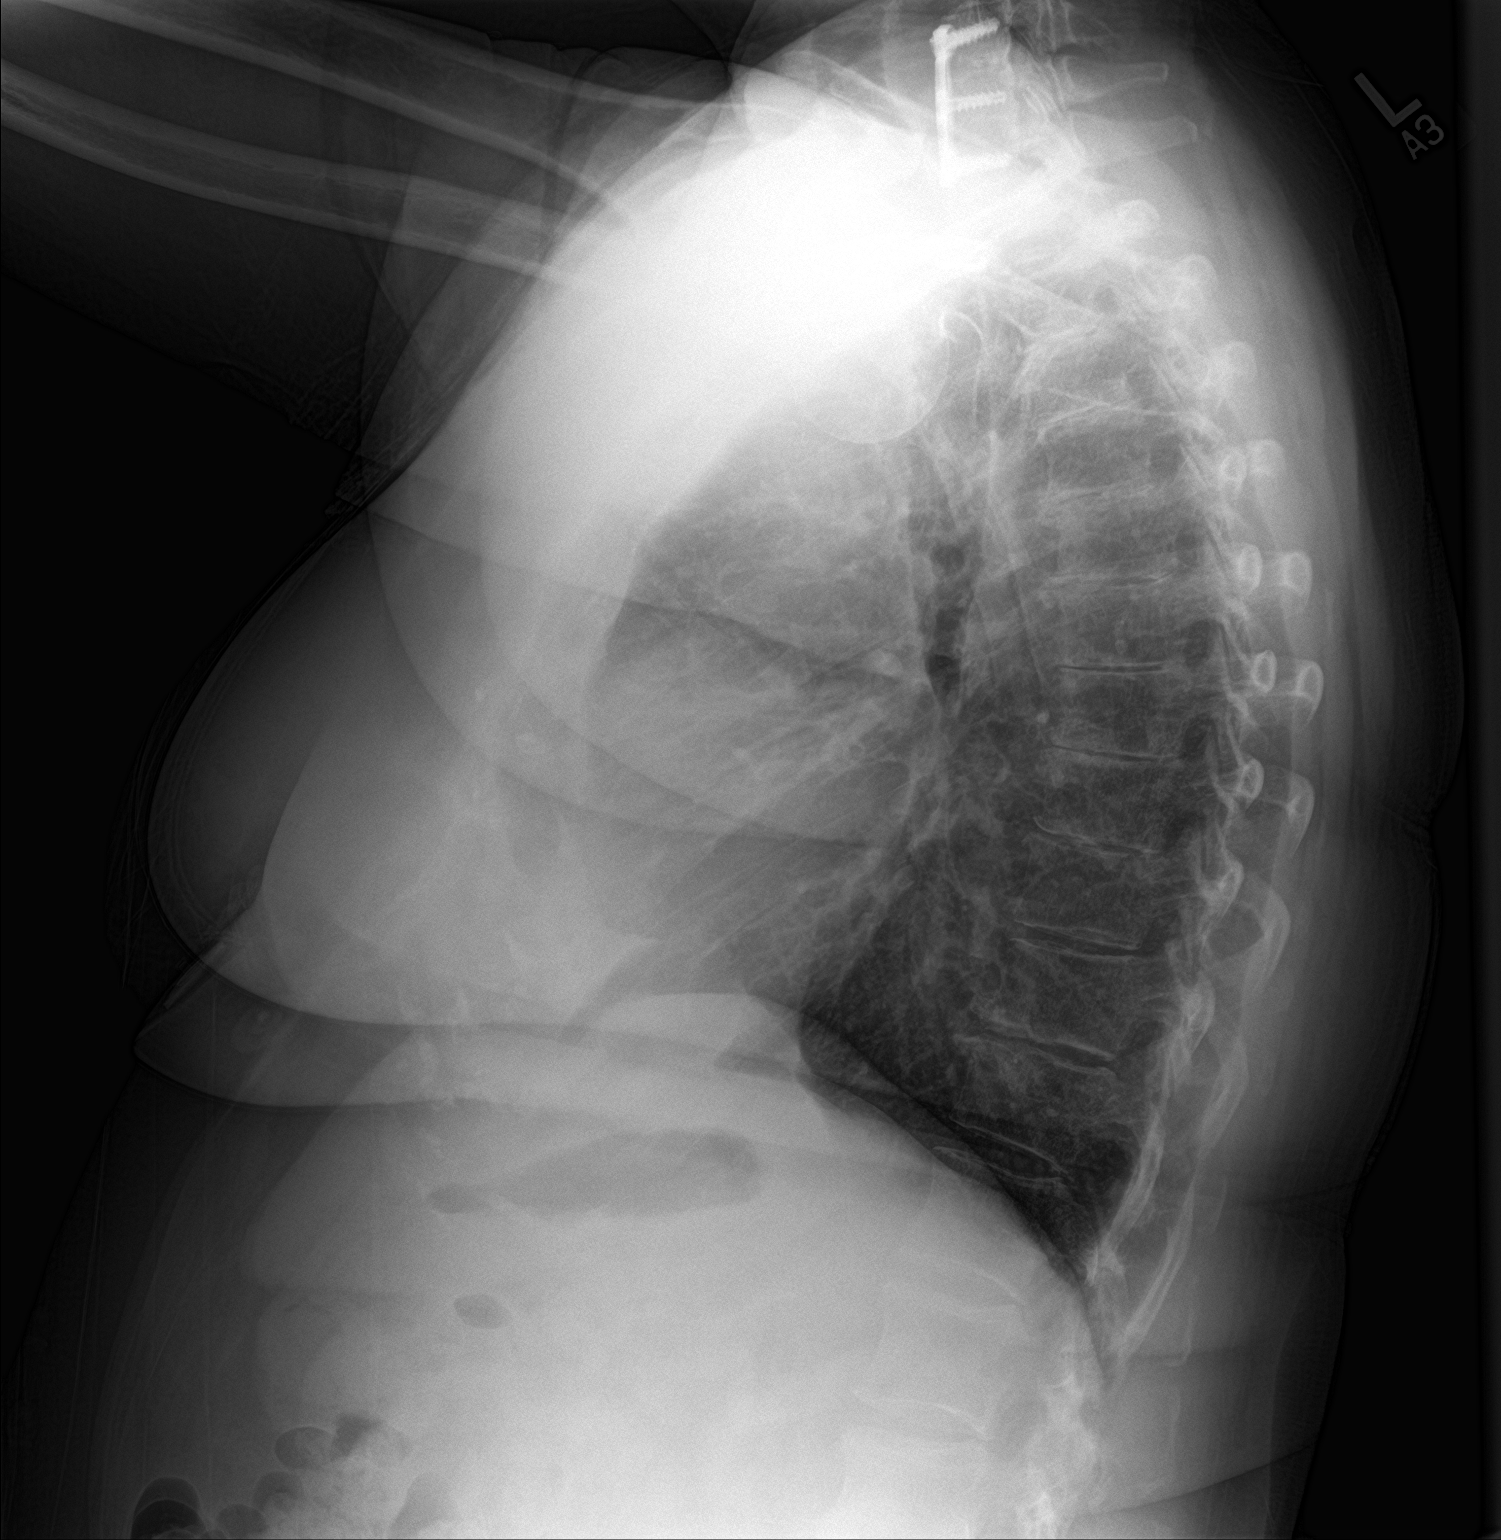

[2 of 2 positions shown; findings below may reference images not displayed]

FINDINGS: There is mild scarring in the left base. There is no edema or
consolidation. The heart size and pulmonary vascularity are normal.
No adenopathy. There is postoperative change in the lower cervical
region. There is degenerative change in the thoracic spine.
IMPRESSION: Mild scarring left base.  No edema or consolidation.

## 2017-11-14 NOTE — Telephone Encounter (Signed)
Is taking yuvafem 10 micrograms 2 times weekly/contact Evalee Mutton @ [facility] [phone]

## 2017-11-14 NOTE — Telephone Encounter (Signed)
Spoke with pharmacist at [facility]; no interactions identified.

## 2017-11-14 NOTE — Telephone Encounter (Signed)
Spoke with patient regarding her medications. She could not remember the medication; patient will call back in a bit to clarify.

## 2017-11-15 DIAGNOSIS — M9902 Segmental and somatic dysfunction of thoracic region: Secondary | ICD-10-CM | POA: Diagnosis not present

## 2017-11-15 DIAGNOSIS — M546 Pain in thoracic spine: Secondary | ICD-10-CM | POA: Diagnosis not present

## 2017-11-15 DIAGNOSIS — M5136 Other intervertebral disc degeneration, lumbar region: Secondary | ICD-10-CM | POA: Diagnosis not present

## 2017-11-15 DIAGNOSIS — M9903 Segmental and somatic dysfunction of lumbar region: Secondary | ICD-10-CM | POA: Diagnosis not present

## 2017-11-15 DIAGNOSIS — M5442 Lumbago with sciatica, left side: Secondary | ICD-10-CM | POA: Diagnosis not present

## 2017-11-15 NOTE — Telephone Encounter (Signed)
Left voicemail regarding no objections to adding hormonal medications to regimen.

## 2017-11-16 ENCOUNTER — Telehealth: Payer: Self-pay | Admitting: Certified Nurse Midwife

## 2017-11-16 NOTE — Telephone Encounter (Signed)
Ashley Mcdonald with Dr. Geraldo Pitter, Cardiologist, called to let Ashley Mcdonald, CNM know they've checked on the Uvifem medication and there are no interactions of concern, it is okay to start her on this medication.

## 2017-11-16 NOTE — Telephone Encounter (Signed)
Forwarding to Melvia Heaps, CNM

## 2017-11-17 ENCOUNTER — Other Ambulatory Visit: Payer: Self-pay | Admitting: Certified Nurse Midwife

## 2017-11-17 DIAGNOSIS — N952 Postmenopausal atrophic vaginitis: Secondary | ICD-10-CM

## 2017-11-17 MED ORDER — ESTRADIOL 10 MCG VA TABS
10.0000 ug | ORAL_TABLET | VAGINAL | 3 refills | Status: DC
Start: 2017-11-17 — End: 2017-11-18

## 2017-11-17 NOTE — Telephone Encounter (Signed)
Will put order in for [facility]

## 2017-11-18 MED ORDER — ESTRADIOL 10 MCG VA TABS: 10.0000 ug | ORAL_TABLET | VAGINAL | 3 refills | Status: DC

## 2017-11-18 NOTE — Telephone Encounter (Signed)
Spoke with patient, advised as seen below per Melvia Heaps, CNM. Patient request RX to Mail order pharmacy -[facility].   Advised patient RN will call [facility] to cancel current RX and place new order to [place] mail order. Patient verbalizes understanding and is agreeable.

## 2017-11-18 NOTE — Telephone Encounter (Signed)
Spoke with pharmacist, [facility] at [facility]. Rx for yuvafem cancelled.   New RX placed for yuvafem to [facility] home delivery.   Routing to provider for final review. Patient is agreeable to disposition. Will close encounter.

## 2017-11-22 DIAGNOSIS — M9902 Segmental and somatic dysfunction of thoracic region: Secondary | ICD-10-CM | POA: Diagnosis not present

## 2017-11-22 DIAGNOSIS — M546 Pain in thoracic spine: Secondary | ICD-10-CM | POA: Diagnosis not present

## 2017-11-22 DIAGNOSIS — M9903 Segmental and somatic dysfunction of lumbar region: Secondary | ICD-10-CM | POA: Diagnosis not present

## 2017-11-22 DIAGNOSIS — M5136 Other intervertebral disc degeneration, lumbar region: Secondary | ICD-10-CM | POA: Diagnosis not present

## 2017-11-22 DIAGNOSIS — M5442 Lumbago with sciatica, left side: Secondary | ICD-10-CM | POA: Diagnosis not present

## 2017-11-24 DIAGNOSIS — M9903 Segmental and somatic dysfunction of lumbar region: Secondary | ICD-10-CM | POA: Diagnosis not present

## 2017-11-24 DIAGNOSIS — M9902 Segmental and somatic dysfunction of thoracic region: Secondary | ICD-10-CM | POA: Diagnosis not present

## 2017-11-24 DIAGNOSIS — M546 Pain in thoracic spine: Secondary | ICD-10-CM | POA: Diagnosis not present

## 2017-11-24 DIAGNOSIS — M5136 Other intervertebral disc degeneration, lumbar region: Secondary | ICD-10-CM | POA: Diagnosis not present

## 2017-11-24 DIAGNOSIS — M5442 Lumbago with sciatica, left side: Secondary | ICD-10-CM | POA: Diagnosis not present

## 2017-11-29 DIAGNOSIS — M9903 Segmental and somatic dysfunction of lumbar region: Secondary | ICD-10-CM | POA: Diagnosis not present

## 2017-11-29 DIAGNOSIS — M546 Pain in thoracic spine: Secondary | ICD-10-CM | POA: Diagnosis not present

## 2017-11-29 DIAGNOSIS — M5442 Lumbago with sciatica, left side: Secondary | ICD-10-CM | POA: Diagnosis not present

## 2017-11-29 DIAGNOSIS — M5136 Other intervertebral disc degeneration, lumbar region: Secondary | ICD-10-CM | POA: Diagnosis not present

## 2017-11-29 DIAGNOSIS — M9902 Segmental and somatic dysfunction of thoracic region: Secondary | ICD-10-CM | POA: Diagnosis not present

## 2017-12-02 DIAGNOSIS — M9902 Segmental and somatic dysfunction of thoracic region: Secondary | ICD-10-CM | POA: Diagnosis not present

## 2017-12-02 DIAGNOSIS — M9903 Segmental and somatic dysfunction of lumbar region: Secondary | ICD-10-CM | POA: Diagnosis not present

## 2017-12-02 DIAGNOSIS — M546 Pain in thoracic spine: Secondary | ICD-10-CM | POA: Diagnosis not present

## 2017-12-02 DIAGNOSIS — M5136 Other intervertebral disc degeneration, lumbar region: Secondary | ICD-10-CM | POA: Diagnosis not present

## 2017-12-02 DIAGNOSIS — M5442 Lumbago with sciatica, left side: Secondary | ICD-10-CM | POA: Diagnosis not present

## 2017-12-20 DIAGNOSIS — M7061 Trochanteric bursitis, right hip: Secondary | ICD-10-CM | POA: Diagnosis not present

## 2017-12-23 DIAGNOSIS — M9903 Segmental and somatic dysfunction of lumbar region: Secondary | ICD-10-CM | POA: Diagnosis not present

## 2017-12-23 DIAGNOSIS — M5136 Other intervertebral disc degeneration, lumbar region: Secondary | ICD-10-CM | POA: Diagnosis not present

## 2017-12-23 DIAGNOSIS — M546 Pain in thoracic spine: Secondary | ICD-10-CM | POA: Diagnosis not present

## 2017-12-23 DIAGNOSIS — M9902 Segmental and somatic dysfunction of thoracic region: Secondary | ICD-10-CM | POA: Diagnosis not present

## 2017-12-23 DIAGNOSIS — M5442 Lumbago with sciatica, left side: Secondary | ICD-10-CM | POA: Diagnosis not present

## 2018-01-02 DIAGNOSIS — Z23 Encounter for immunization: Secondary | ICD-10-CM | POA: Diagnosis not present

## 2018-01-02 DIAGNOSIS — Z6830 Body mass index (BMI) 30.0-30.9, adult: Secondary | ICD-10-CM | POA: Diagnosis not present

## 2018-01-02 DIAGNOSIS — I10 Essential (primary) hypertension: Secondary | ICD-10-CM | POA: Diagnosis not present

## 2018-01-02 DIAGNOSIS — Z1331 Encounter for screening for depression: Secondary | ICD-10-CM | POA: Diagnosis not present

## 2018-01-19 DIAGNOSIS — M9902 Segmental and somatic dysfunction of thoracic region: Secondary | ICD-10-CM | POA: Diagnosis not present

## 2018-01-19 DIAGNOSIS — M9903 Segmental and somatic dysfunction of lumbar region: Secondary | ICD-10-CM | POA: Diagnosis not present

## 2018-01-19 DIAGNOSIS — M5136 Other intervertebral disc degeneration, lumbar region: Secondary | ICD-10-CM | POA: Diagnosis not present

## 2018-01-19 DIAGNOSIS — M5442 Lumbago with sciatica, left side: Secondary | ICD-10-CM | POA: Diagnosis not present

## 2018-01-19 DIAGNOSIS — M546 Pain in thoracic spine: Secondary | ICD-10-CM | POA: Diagnosis not present

## 2018-02-09 DIAGNOSIS — I480 Paroxysmal atrial fibrillation: Secondary | ICD-10-CM | POA: Diagnosis not present

## 2018-02-09 DIAGNOSIS — R079 Chest pain, unspecified: Secondary | ICD-10-CM | POA: Diagnosis not present

## 2018-02-09 DIAGNOSIS — R778 Other specified abnormalities of plasma proteins: Secondary | ICD-10-CM | POA: Diagnosis not present

## 2018-02-09 DIAGNOSIS — I10 Essential (primary) hypertension: Secondary | ICD-10-CM | POA: Diagnosis not present

## 2018-02-09 DIAGNOSIS — R0602 Shortness of breath: Secondary | ICD-10-CM | POA: Diagnosis not present

## 2018-02-09 DIAGNOSIS — E7800 Pure hypercholesterolemia, unspecified: Secondary | ICD-10-CM | POA: Diagnosis not present

## 2018-02-09 DIAGNOSIS — I482 Chronic atrial fibrillation: Secondary | ICD-10-CM | POA: Diagnosis not present

## 2018-02-09 DIAGNOSIS — R7989 Other specified abnormal findings of blood chemistry: Secondary | ICD-10-CM | POA: Diagnosis not present

## 2018-02-10 DIAGNOSIS — R0602 Shortness of breath: Secondary | ICD-10-CM | POA: Diagnosis not present

## 2018-02-13 DIAGNOSIS — I480 Paroxysmal atrial fibrillation: Secondary | ICD-10-CM | POA: Diagnosis not present

## 2018-02-13 DIAGNOSIS — Z6830 Body mass index (BMI) 30.0-30.9, adult: Secondary | ICD-10-CM | POA: Diagnosis not present

## 2018-02-13 DIAGNOSIS — R69 Illness, unspecified: Secondary | ICD-10-CM | POA: Diagnosis not present

## 2018-02-13 DIAGNOSIS — R748 Abnormal levels of other serum enzymes: Secondary | ICD-10-CM | POA: Diagnosis not present

## 2018-02-14 ENCOUNTER — Encounter: Payer: Self-pay | Admitting: Cardiology

## 2018-02-14 ENCOUNTER — Other Ambulatory Visit: Payer: Self-pay

## 2018-02-14 ENCOUNTER — Ambulatory Visit (INDEPENDENT_AMBULATORY_CARE_PROVIDER_SITE_OTHER): Payer: Medicare Other | Admitting: Cardiology

## 2018-02-14 VITALS — BP 122/70 | HR 74 | Ht 63.0 in | Wt 169.0 lb

## 2018-02-14 DIAGNOSIS — I10 Essential (primary) hypertension: Secondary | ICD-10-CM | POA: Diagnosis not present

## 2018-02-14 DIAGNOSIS — I480 Paroxysmal atrial fibrillation: Secondary | ICD-10-CM | POA: Diagnosis not present

## 2018-02-14 DIAGNOSIS — I471 Supraventricular tachycardia: Secondary | ICD-10-CM | POA: Diagnosis not present

## 2018-02-14 DIAGNOSIS — I2510 Atherosclerotic heart disease of native coronary artery without angina pectoris: Secondary | ICD-10-CM | POA: Diagnosis not present

## 2018-02-14 HISTORY — DX: Paroxysmal atrial fibrillation: I48.0

## 2018-02-14 MED ORDER — ASPIRIN EC 325 MG PO TBEC: 325.0000 mg | DELAYED_RELEASE_TABLET | Freq: Every day | ORAL | 1 refills | Status: DC

## 2018-02-14 MED ORDER — FLECAINIDE ACETATE 50 MG PO TABS: 50.0000 mg | ORAL_TABLET | Freq: Two times a day (BID) | ORAL | 1 refills | Status: DC

## 2018-02-14 NOTE — Patient Instructions (Addendum)
Medication Instructions:  Your physician has recommended you make the following change in your medication:  START flecainide 50 mg twice daily START 325 mg enteric coated aspirin daily  Labwork: Your physician recommends that you have the following labs drawn: BMP and MAG  Testing/Procedures: None  Follow-Up: Your physician recommends that you schedule a follow-up appointment in: 2 months  Please come for a nurse visit on 03/08 and 03/12 for EKG  Any Other Special Instructions Will Be Listed Below (If Applicable).     If you need a refill on your cardiac medications before your next appointment, please call your pharmacy.   [place], RN, BSN

## 2018-02-14 NOTE — Progress Notes (Signed)
Cardiology Office Note:    Date:  02/14/2018   ID:  [name], DOB 03-Aug-1951, MRN [id number]  PCP:  Myer Peer, MD  Cardiologist:  Jenean Lindau, MD   Referring MD: Myer Peer, MD    ASSESSMENT:    1. PAF (paroxysmal atrial fibrillation) ([place])   2. 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 Essential hypertension   4. Supraventricular tachycardia ([place])    PLAN:    In order of problems listed above:  1. I discussed my findings with the patient at extensive length and secondary prevention stressed and she vocalized understanding. 2. I discussed with the patient atrial fibrillation, disease process. Management and therapy including rate and rhythm control, anticoagulation benefits and potential risks were discussed extensively with the patient. Patient had multiple questions which were answered to patient's satisfaction. 3. By the chads criteria, her score is 1.  But with CHADS-Vacs she needs to be considered for anticoagulation and the benefits and risks were explained to her at extensive length and she prefers to take a full-strength coated aspirin on a regular basis.  She says because of issues with acidity and GI symptoms she will take a proton pump inhibitor with this over-the-counter. 4. I reviewed coronary angiography report late last year and I feel fairly comfortable that at this point she does not likely need coronary artery evaluation.  However I would like to do a stress test every year now that I am going to begin her on flecainide.  I will initiate her on flecainide 50 mg twice a day and monitor her for this. 5. Patient will be seen in follow-up appointment in 6 months or earlier if the patient has any concerns.    Medication Adjustments/Labs and Tests Ordered: Current medicines are reviewed at length with the patient today.  Concerns regarding medicines are outlined above.  Orders Placed This  Encounter  Procedures  . Basic metabolic panel  . Magnesium  . EKG 12-Lead   Meds ordered this encounter  Medications  . flecainide (TAMBOCOR) 50 MG tablet    Sig: Take 1 tablet (50 mg total) by mouth 2 (two) times daily.    Dispense:  90 tablet    Refill:  1     Chief Complaint  Patient presents with  . Follow-up  . Coronary Artery Disease     History of Present Illness:    Ashley Mcdonald is a 67 y.o. female.  The patient has history of essential hypertension and nonobstructive coronary artery disease by coronary angiography done during the later part of last year.  She has history of palpitations.  She went to the emergency room and was found to be in atrial fibrillation with rapid ventricular rate with spontaneous conversion.  She was discharged from the hospital.  Her chads score is 1 therefore she was not initiated on anticoagulation.  As any chest pain orthopnea or PND.  [place] [facility] [place] [facility] [place] [facility].  At the time of my evaluation, the patient is alert awake oriented and in no distress.  Past Medical History:  Diagnosis Date  . Arthritis   . Complication of anesthesia   . Cystic breast   . Heart murmur    " nothing to be concerned about"  . Hyperlipidemia   . Hypertension   . Norovirus   . Plantar fasciitis 2006   right  .  PONV (postoperative nausea and vomiting)   . Puncture wound    right leg  . Shortness of breath dyspnea    with exertion    Past Surgical History:  Procedure Laterality Date  . COLONOSCOPY    . HYSTEROSCOPY     D&C polypectomy  . LEFT HEART CATH AND CORONARY ANGIOGRAPHY N/A 08/23/2017   Procedure: LEFT HEART CATH AND CORONARY ANGIOGRAPHY;  Surgeon: Belva Crome, MD;  Location: [place] [facility];  Service: Cardiovascular;  Laterality: N/A;  . SPINAL FUSION  2008   c5-7  . TOTAL HIP ARTHROPLASTY Left 03/22/2016   Procedure: TOTAL HIP ARTHROPLASTY ANTERIOR APPROACH;  Surgeon: Frederik Pear, MD;   Location: [place];  Service: Orthopedics;  Laterality: Left;    Current Medications: Current Meds  Medication Sig  . acetaminophen (TYLENOL) 650 MG CR tablet Take 650 mg by mouth at bedtime as needed for pain.  . Calcium Carbonate-Vitamin D (CALCIUM-D PO) Take 3 tablets by mouth every evening.  Marland Kitchen CRANBERRY PO Take 1 tablet by mouth daily.   . cyclobenzaprine (FLEXERIL) 5 MG tablet Take 5 mg by mouth daily as needed for muscle spasms.  . Estradiol (YUVAFEM) 10 MCG TABS vaginal tablet Place 1 tablet (10 mcg total) vaginally 2 (two) times a week.  . Menthol (BIOFREEZE) 10 % AERO Apply 1 application topically daily as needed (Muscle pain).  . metoprolol succinate (TOPROL-XL) 25 MG 24 hr tablet Take 25 mg by mouth at bedtime.   . Nutritional Supplements (JUICE PLUS FIBRE PO) Juice plus Veggie  1 tablet by mouth twice daily  Juice plus Fruit   1 tablet by mouth twice daily  Juice plus Vineyard 1 tablet by mouth twice daily  . Omega-3 Fatty Acids (FISH OIL PO) Take 1 capsule by mouth daily.  . Red Yeast Rice 600 MG CAPS Take 600 mg by mouth at bedtime.   Marland Kitchen telmisartan-hydrochlorothiazide (MICARDIS HCT) 40-12.5 MG tablet Take 1 tablet by mouth every morning.      Allergies:   Nsaids   Social History   Socioeconomic History  . Marital status: Married    Spouse name: None  . Number of children: None  . Years of education: None  . Highest education level: None  Social Needs  . Financial resource strain: None  . Food insecurity - worry: None  . Food insecurity - inability: None  . Transportation needs - medical: None  . Transportation needs - non-medical: None  Occupational History  . None  Tobacco Use  . Smoking status: Never Smoker  . Smokeless tobacco: Never Used  Substance and Sexual Activity  . Alcohol use: No    Alcohol/week: 0.0 oz  . Drug use: No  . Sexual activity: No    Partners: Male    Birth control/protection: Post-menopausal  Other Topics Concern  . None  Social  History Narrative  . None     Family History: The patient's family history includes Cancer in her maternal grandmother; Diabetes in her paternal grandmother; Heart attack in her father; Stroke in her mother.  ROS:   Please see the history of present illness.    All other systems reviewed and are negative.  EKGs/Labs/Other Studies Reviewed:    The following studies were reviewed today: I discussed my findings with the patient at extensive length.  Hospital records were reviewed extensively.  EKG reveals sinus rhythm and nonspecific ST-T changes.   Recent Labs: 05/13/2017: TSH 1.613 08/01/2017: Magnesium 2.2 08/19/2017: BUN 18; Creatinine, Ser 0.81;  Hemoglobin 12.0; Platelets 209; Potassium 3.7; Sodium 139  Recent Lipid Panel No results found for: CHOL, TRIG, HDL, CHOLHDL, VLDL, LDLCALC, LDLDIRECT  Physical Exam:    VS:  BP 122/70 (BP Location: Left Arm, Patient Position: Sitting, Cuff Size: Normal)   Pulse 74   Ht 5\' 3"  (1.6 m)   Wt 169 lb (76.7 kg)   LMP 09/12/2002   SpO2 98%   BMI 29.94 kg/m     Wt Readings from Last 3 Encounters:  02/14/18 169 lb (76.7 kg)  11/08/17 169 lb (76.7 kg)  09/16/17 170 lb (77.1 kg)     GEN: Patient is in no acute distress HEENT: Normal NECK: No JVD; No carotid bruits LYMPHATICS: No lymphadenopathy CARDIAC: Hear sounds regular, 2/6 systolic murmur at the apex. RESPIRATORY:  Clear to auscultation without rales, wheezing or rhonchi  ABDOMEN: Soft, non-tender, non-distended MUSCULOSKELETAL:  No edema; No deformity  SKIN: Warm and dry NEUROLOGIC:  Alert and oriented x 3 PSYCHIATRIC:  Normal affect   Signed, Jenean Lindau, MD  02/14/2018 3:19 PM    [facility]

## 2018-02-15 ENCOUNTER — Other Ambulatory Visit: Payer: Self-pay

## 2018-02-15 DIAGNOSIS — I2510 Atherosclerotic heart disease of native coronary artery without angina pectoris: Secondary | ICD-10-CM

## 2018-02-15 LAB — BASIC METABOLIC PANEL
BUN / CREAT RATIO: 15 (ref 12–28)
BUN: 11 mg/dL (ref 8–27)
CHLORIDE: 103 mmol/L (ref 96–106)
CO2: 25 mmol/L (ref 20–29)
Calcium: 9.8 mg/dL (ref 8.7–10.3)
Creatinine, Ser: 0.73 mg/dL (ref 0.57–1.00)
GFR calc non Af Amer: 86 mL/min/{1.73_m2} (ref >59)
GFR, EST AFRICAN AMERICAN: 99 mL/min/{1.73_m2} (ref >59)
Glucose: 78 mg/dL (ref 65–99)
POTASSIUM: 4 mmol/L (ref 3.5–5.2)
Sodium: 142 mmol/L (ref 134–144)

## 2018-02-15 LAB — MAGNESIUM: Magnesium: 2.1 mg/dL (ref 1.6–2.3)

## 2018-02-15 MED ORDER — ATORVASTATIN CALCIUM 10 MG PO TABS: 10.0000 mg | ORAL_TABLET | Freq: Every day | ORAL | 1 refills | Status: DC

## 2018-02-17 ENCOUNTER — Ambulatory Visit (INDEPENDENT_AMBULATORY_CARE_PROVIDER_SITE_OTHER): Payer: Medicare Other | Admitting: Cardiology

## 2018-02-17 VITALS — HR 64

## 2018-02-17 DIAGNOSIS — I480 Paroxysmal atrial fibrillation: Secondary | ICD-10-CM

## 2018-02-17 MED ORDER — APIXABAN 5 MG PO TABS: 5.0000 mg | ORAL_TABLET | Freq: Two times a day (BID) | ORAL | 1 refills | Status: DC

## 2018-02-17 NOTE — Progress Notes (Signed)
Patient presented for EKG nurse visit. Patient stated that she had been experiencing diarrhea from the aspirin initiated. Patient also stated she had been experiencing mild blurred vision from the flecainide. Per Dr. Geraldo Pitter the patient needs anticoagulation- eliquis was prescribed (5 mg BID). Patient was hesitant to initiate the medication, thus education and support was provided by myself. Aspirin was discontinued. Patient will follow up at PCP office in two weeks for CBC. Ifob was also given to rule out possible GI bleed.    Benefits and potential risks of anticoagulation were discussed with her during this visit and the previous visit when she saw me.  She and her husband had multiple questions at that time which were answered to their satisfaction.  We informed the patient that if she has any issues with blurring of vision we will be glad to change her flecainide and she tells [place] that it is getting better now and she will keep [place] posted.

## 2018-02-17 NOTE — Patient Instructions (Addendum)
.Medication Instructions:  Your physician has recommended you make the following change in your medication:  START eliquis 5 mg twice daily STOP aspirin and pepcid  Labwork: Your physician recommends that you have the following labs drawn: CBC at your primary care in 2 weeks  Testing/Procedures: None  Follow-Up: Your physician recommends that you schedule a follow-up appointment in: as directed previously  Any Other Special Instructions Will Be Listed Below (If Applicable).     If you need a refill on your cardiac medications before your next appointment, please call your pharmacy.   [place], RN, BSN  Apixaban oral tablets What is this medicine? APIXABAN (a PIX a ban) is an anticoagulant (blood thinner). It is used to lower the chance of stroke in people with a medical condition called atrial fibrillation. It is also used to treat or prevent blood clots in the lungs or in the veins. This medicine may be used for other purposes; ask your health care provider or pharmacist if you have questions. COMMON BRAND NAME(S): Eliquis What should I tell my health care provider before I take this medicine? They need to know if you have any of these conditions: -bleeding disorders -bleeding in the brain -blood in your stools (black or tarry stools) or if you have blood in your vomit -history of stomach bleeding -kidney disease -liver disease -mechanical heart valve -an unusual or allergic reaction to apixaban, other medicines, foods, dyes, or preservatives -pregnant or trying to get pregnant -breast-feeding How should I use this medicine? Take this medicine by mouth with a glass of water. Follow the directions on the prescription label. You can take it with or without food. If it upsets your stomach, take it with food. Take your medicine at regular intervals. Do not take it more often than directed. Do not stop taking except on your doctor's advice. Stopping this  medicine may increase your risk of a blot clot. Be sure to refill your prescription before you run out of medicine. Talk to your pediatrician regarding the use of this medicine in children. Special care may be needed. Overdosage: If you think you have taken too much of this medicine contact a poison control center or emergency room at once. NOTE: This medicine is only for you. Do not share this medicine with others. What if I miss a dose? If you miss a dose, take it as soon as you can. If it is almost time for your next dose, take only that dose. Do not take double or extra doses. What may interact with this medicine? This medicine may interact with the following: -aspirin and aspirin-like medicines -certain medicines for fungal infections like ketoconazole and itraconazole -certain medicines for seizures like carbamazepine and phenytoin -certain medicines that treat or prevent blood clots like warfarin, enoxaparin, and dalteparin -clarithromycin -NSAIDs, medicines for pain and inflammation, like ibuprofen or naproxen -rifampin -ritonavir -St. John's wort This list may not describe all possible interactions. Give your health care provider a list of all the medicines, herbs, non-prescription drugs, or dietary supplements you use. Also tell them if you smoke, drink alcohol, or use illegal drugs. Some items may interact with your medicine. What should I watch for while using this medicine? Visit your doctor or health care professional for regular checks on your progress. Notify your doctor or health care professional and seek emergency treatment if you develop breathing problems; changes in vision; chest pain; severe, sudden headache; pain, swelling, warmth in the leg; trouble speaking;  sudden numbness or weakness of the face, arm or leg. These can be signs that your condition has gotten worse. If you are going to have surgery or other procedure, tell your doctor that you are taking this  medicine. What side effects may I notice from receiving this medicine? Side effects that you should report to your doctor or health care professional as soon as possible: -allergic reactions like skin rash, itching or hives, swelling of the face, lips, or tongue -signs and symptoms of bleeding such as bloody or black, tarry stools; red or dark-brown urine; spitting up blood or brown material that looks like coffee grounds; red spots on the skin; unusual bruising or bleeding from the eye, gums, or nose This list may not describe all possible side effects. Call your doctor for medical advice about side effects. You may report side effects to FDA at 1-800-FDA-1088. Where should I keep my medicine? Keep out of the reach of children. Store at room temperature between 20 and 25 degrees C (68 and 77 degrees F). Throw away any unused medicine after the expiration date. NOTE: This sheet is a summary. It may not cover all possible information. If you have questions about this medicine, talk to your doctor, pharmacist, or health care provider.  2018 Elsevier/Gold Standard (2016-06-21 11:54:23)

## 2018-02-21 ENCOUNTER — Ambulatory Visit (INDEPENDENT_AMBULATORY_CARE_PROVIDER_SITE_OTHER): Payer: Medicare Other | Admitting: Cardiology

## 2018-02-21 VITALS — BP 158/84 | HR 61

## 2018-02-21 DIAGNOSIS — I480 Paroxysmal atrial fibrillation: Secondary | ICD-10-CM | POA: Diagnosis not present

## 2018-02-21 NOTE — Patient Instructions (Signed)
Medication Instructions:  Your physician recommends that you continue on your current medications as directed. Please refer to the Current Medication list given to you today.   Labwork: BMP and CBC and MAG  Testing/Procedures: none  Follow-Up: Your physician recommends that you schedule a follow-up appointment in: 1 week for nurse visit.   Any Other Special Instructions Will Be Listed Below (If Applicable).     If you need a refill on your cardiac medications before your next appointment, please call your pharmacy. '

## 2018-02-21 NOTE — Progress Notes (Signed)
Patient presented  today for  EKG due to flecainide therapy, per Dr. Geraldo Pitter would like to see back in one week for another EKG. Patient was instructed she can return back to work and blood work was drawn today.

## 2018-02-22 LAB — CBC WITH DIFFERENTIAL/PLATELET
BASOS: 0 %
Basophils Absolute: 0 10*3/uL (ref 0.0–0.2)
EOS (ABSOLUTE): 0 10*3/uL (ref 0.0–0.4)
Eos: 1 %
HEMATOCRIT: 39 % (ref 34.0–46.6)
Hemoglobin: 13.1 g/dL (ref 11.1–15.9)
Immature Grans (Abs): 0 10*3/uL (ref 0.0–0.1)
Immature Granulocytes: 0 %
LYMPHS ABS: 1.1 10*3/uL (ref 0.7–3.1)
Lymphs: 17 %
MCH: 27.9 pg (ref 26.6–33.0)
MCHC: 33.6 g/dL (ref 31.5–35.7)
MCV: 83 fL (ref 79–97)
Monocytes Absolute: 0.4 10*3/uL (ref 0.1–0.9)
Monocytes: 6 %
NEUTROS ABS: 4.9 10*3/uL (ref 1.4–7.0)
Neutrophils: 76 %
PLATELETS: 212 10*3/uL (ref 150–379)
RBC: 4.69 x10E6/uL (ref 3.77–5.28)
RDW: 14 % (ref 12.3–15.4)
WBC: 6.4 10*3/uL (ref 3.4–10.8)

## 2018-02-22 LAB — BASIC METABOLIC PANEL
BUN / CREAT RATIO: 14 (ref 12–28)
BUN: 11 mg/dL (ref 8–27)
CO2: 23 mmol/L (ref 20–29)
Calcium: 9.5 mg/dL (ref 8.7–10.3)
Chloride: 105 mmol/L (ref 96–106)
Creatinine, Ser: 0.8 mg/dL (ref 0.57–1.00)
GFR calc non Af Amer: 77 mL/min/{1.73_m2} (ref >59)
GFR, EST AFRICAN AMERICAN: 89 mL/min/{1.73_m2} (ref >59)
Glucose: 125 mg/dL — ABNORMAL HIGH (ref 65–99)
POTASSIUM: 4.1 mmol/L (ref 3.5–5.2)
SODIUM: 143 mmol/L (ref 134–144)

## 2018-02-22 LAB — MAGNESIUM: Magnesium: 2.5 mg/dL — ABNORMAL HIGH (ref 1.6–2.3)

## 2018-02-23 DIAGNOSIS — I480 Paroxysmal atrial fibrillation: Secondary | ICD-10-CM | POA: Diagnosis not present

## 2018-02-28 ENCOUNTER — Ambulatory Visit (INDEPENDENT_AMBULATORY_CARE_PROVIDER_SITE_OTHER): Payer: Medicare Other | Admitting: Cardiology

## 2018-02-28 DIAGNOSIS — I480 Paroxysmal atrial fibrillation: Secondary | ICD-10-CM | POA: Diagnosis not present

## 2018-02-28 NOTE — Progress Notes (Signed)
Patient presented today for a EKG due to  flecainide therapy, per Dr Geraldo Pitter would like to see her back at regularly physician schedule follow up.

## 2018-02-28 NOTE — Patient Instructions (Addendum)
Medication Instructions:  Your physician recommends that you continue on your current medications as directed. Please refer to the Current Medication list given to you today.   Labwork: none Testing/Procedures: None   Follow-Up: Your physician recommends that you schedule a follow-up appointment in: two months from 02/14/2018   Any Other Special Instructions Will Be Listed Below (If Applicable).     If you need a refill on your cardiac medications before your next appointment, please call your pharmacy.

## 2018-03-02 LAB — FECAL OCCULT BLOOD, IMMUNOCHEMICAL: Fecal Occult Bld: NEGATIVE

## 2018-03-13 DIAGNOSIS — Z1231 Encounter for screening mammogram for malignant neoplasm of breast: Secondary | ICD-10-CM | POA: Diagnosis not present

## 2018-03-14 ENCOUNTER — Other Ambulatory Visit: Payer: Self-pay | Admitting: Certified Nurse Midwife

## 2018-03-14 ENCOUNTER — Telehealth: Payer: Self-pay | Admitting: Certified Nurse Midwife

## 2018-03-14 DIAGNOSIS — M9903 Segmental and somatic dysfunction of lumbar region: Secondary | ICD-10-CM | POA: Diagnosis not present

## 2018-03-14 DIAGNOSIS — N952 Postmenopausal atrophic vaginitis: Secondary | ICD-10-CM

## 2018-03-14 DIAGNOSIS — M9902 Segmental and somatic dysfunction of thoracic region: Secondary | ICD-10-CM | POA: Diagnosis not present

## 2018-03-14 DIAGNOSIS — M5136 Other intervertebral disc degeneration, lumbar region: Secondary | ICD-10-CM | POA: Diagnosis not present

## 2018-03-14 DIAGNOSIS — M5442 Lumbago with sciatica, left side: Secondary | ICD-10-CM | POA: Diagnosis not present

## 2018-03-14 DIAGNOSIS — M546 Pain in thoracic spine: Secondary | ICD-10-CM | POA: Diagnosis not present

## 2018-03-14 MED ORDER — ESTRADIOL 10 MCG VA TABS: 10.0000 ug | ORAL_TABLET | VAGINAL | 4 refills | Status: DC

## 2018-03-14 NOTE — Telephone Encounter (Signed)
Patient called requesting 90-day supply of Yuvafem 10 mcg sent to [facility] .  Medication refill request: Yuvafem 9mcg #24,R2 Last AEX:  11-08-17 Next AEX: 11-17-18 Last MMG (if hormonal medication request): 03-13-18 Neg/BiRads1--[facility](just received report and to be scanned) Refill authorized: Please advise

## 2018-03-14 NOTE — Telephone Encounter (Signed)
Medication refill request: Ashley Mcdonald  Last AEX:  11-08-17  Next AEX: 11-17-18  Last MMG (if hormonal medication request): 12-17-16 WNL  Refill authorized: please advise

## 2018-03-14 NOTE — Telephone Encounter (Signed)
Patient is calling to request a 3 month supply of Yuvafem sent to [facility] order. Pharmacy number is [phone].

## 2018-03-14 NOTE — Telephone Encounter (Signed)
Refill sent.

## 2018-03-17 DIAGNOSIS — R079 Chest pain, unspecified: Secondary | ICD-10-CM | POA: Diagnosis not present

## 2018-03-17 DIAGNOSIS — R0600 Dyspnea, unspecified: Secondary | ICD-10-CM | POA: Diagnosis not present

## 2018-03-17 DIAGNOSIS — M940 Chondrocostal junction syndrome [Tietze]: Secondary | ICD-10-CM | POA: Diagnosis not present

## 2018-03-28 DIAGNOSIS — M9903 Segmental and somatic dysfunction of lumbar region: Secondary | ICD-10-CM | POA: Diagnosis not present

## 2018-03-28 DIAGNOSIS — M5136 Other intervertebral disc degeneration, lumbar region: Secondary | ICD-10-CM | POA: Diagnosis not present

## 2018-03-28 DIAGNOSIS — M9902 Segmental and somatic dysfunction of thoracic region: Secondary | ICD-10-CM | POA: Diagnosis not present

## 2018-03-28 DIAGNOSIS — M5442 Lumbago with sciatica, left side: Secondary | ICD-10-CM | POA: Diagnosis not present

## 2018-03-28 DIAGNOSIS — M546 Pain in thoracic spine: Secondary | ICD-10-CM | POA: Diagnosis not present

## 2018-04-04 ENCOUNTER — Encounter: Payer: Self-pay | Admitting: Certified Nurse Midwife

## 2018-04-09 DIAGNOSIS — R309 Painful micturition, unspecified: Secondary | ICD-10-CM | POA: Diagnosis not present

## 2018-04-09 DIAGNOSIS — R319 Hematuria, unspecified: Secondary | ICD-10-CM | POA: Diagnosis not present

## 2018-04-09 DIAGNOSIS — N3001 Acute cystitis with hematuria: Secondary | ICD-10-CM | POA: Diagnosis not present

## 2018-04-10 DIAGNOSIS — H25813 Combined forms of age-related cataract, bilateral: Secondary | ICD-10-CM | POA: Diagnosis not present

## 2018-04-11 ENCOUNTER — Ambulatory Visit: Payer: Medicare Other | Admitting: Cardiology

## 2018-04-11 ENCOUNTER — Ambulatory Visit (INDEPENDENT_AMBULATORY_CARE_PROVIDER_SITE_OTHER): Payer: Medicare Other | Admitting: Cardiology

## 2018-04-11 ENCOUNTER — Other Ambulatory Visit: Payer: Self-pay

## 2018-04-11 ENCOUNTER — Encounter: Payer: Self-pay | Admitting: Cardiology

## 2018-04-11 VITALS — BP 146/72 | HR 85 | Ht 63.0 in | Wt 171.7 lb

## 2018-04-11 DIAGNOSIS — I2510 Atherosclerotic heart disease of native coronary artery without angina pectoris: Secondary | ICD-10-CM | POA: Diagnosis not present

## 2018-04-11 DIAGNOSIS — I480 Paroxysmal atrial fibrillation: Secondary | ICD-10-CM | POA: Diagnosis not present

## 2018-04-11 DIAGNOSIS — I10 Essential (primary) hypertension: Secondary | ICD-10-CM | POA: Diagnosis not present

## 2018-04-11 NOTE — Patient Instructions (Signed)
Medication Instructions:  Your physician recommends that you continue on your current medications as directed. Please refer to the Current Medication list given to you today.  Labwork: Your physician recommends that you have the following labs drawn: BMP  Testing/Procedures: None  Follow-Up: Your physician recommends that you schedule a follow-up appointment in: 6 months  Any Other Special Instructions Will Be Listed Below (If Applicable).     If you need a refill on your cardiac medications before your next appointment, please call your pharmacy.   [facility]  Emmalea Treanor A, RN, BSN  

## 2018-04-11 NOTE — Progress Notes (Signed)
Cardiology Office Note:    Date:  04/11/2018   ID:  [name], DOB 22-Jan-1951, MRN [id number]  PCP:  Myer Peer, MD  Cardiologist:  Jenean Lindau, MD   Referring MD: Myer Peer, MD    ASSESSMENT:    1. PAF (paroxysmal atrial fibrillation) ([place])   2. Essential hypertension   3. Coronary artery disease involving native coronary artery of native heart without angina pectoris    PLAN:    In order of problems listed above:  1. Primary prevention stressed with the patient.  Importance of compliance with diet and medications stressed and she vocalized understanding.  I discussed with her about an exercise program on a regular basis and losing weight and she vocalized understanding. 2. I discussed with the patient atrial fibrillation, disease process. Management and therapy including rate and rhythm control, anticoagulation benefits and potential risks were discussed extensively with the patient. Patient had multiple questions which were answered to patient's satisfaction. 3. Patient will be seen in follow-up appointment in 6 months or earlier if the patient has any concerns.    Medication Adjustments/Labs and Tests Ordered: Current medicines are reviewed at length with the patient today.  Concerns regarding medicines are outlined above.  Orders Placed This Encounter  Procedures  . Basic Metabolic Panel (BMET)  . EKG 12-Lead   No orders of the defined types were placed in this encounter.    Chief Complaint  Patient presents with  . Follow-up  . Atrial Fibrillation     History of Present Illness:    Ashley Mcdonald is a 67 y.o. female.  The patient has paroxysmal atrial fibrillation.  She denies any problems at this time and takes care of activities of daily living.  No chest pain orthopnea PND.  She is very happy now because she hardly experiences any palpitations and is living an active lifestyle.  Unfortunately she does not exercise on a regular basis.  Past  Medical History:  Diagnosis Date  . Arthritis   . Complication of anesthesia   . Cystic breast   . Heart murmur    " nothing to be concerned about"  . Hyperlipidemia   . Hypertension   . Norovirus   . Plantar fasciitis 2006   right  . PONV (postoperative nausea and vomiting)   . Puncture wound    right leg  . Shortness of breath dyspnea    with exertion    Past Surgical History:  Procedure Laterality Date  . COLONOSCOPY    . HYSTEROSCOPY     D&C polypectomy  . LEFT HEART CATH AND CORONARY ANGIOGRAPHY N/A 08/23/2017   Procedure: LEFT HEART CATH AND CORONARY ANGIOGRAPHY;  Surgeon: Belva Crome, MD;  Location: [facility];  Service: Cardiovascular;  Laterality: N/A;  . SPINAL FUSION  2008   c5-7  . TOTAL HIP ARTHROPLASTY Left 03/22/2016   Procedure: TOTAL HIP ARTHROPLASTY ANTERIOR APPROACH;  Surgeon: Frederik Pear, MD;  Location: [facility];  Service: Orthopedics;  Laterality: Left;    Current Medications: Current Meds  Medication Sig  . acetaminophen (TYLENOL) 650 MG CR tablet Take 650 mg by mouth at bedtime as needed for pain.  Marland Kitchen apixaban (ELIQUIS) 5 MG TABS tablet Take 1 tablet (5 mg total) by mouth 2 (two) times daily.  Marland Kitchen atorvastatin (LIPITOR) 10 MG tablet Take 1 tablet (10 mg total) by mouth daily at 6 PM.  . Calcium Carbonate-Vitamin D (CALCIUM-D PO) Take 3 tablets by mouth every  evening.  . cyclobenzaprine (FLEXERIL) 5 MG tablet Take 5 mg by mouth daily as needed for muscle spasms.  . diclofenac sodium (VOLTAREN) 1 % GEL APPLY 2 GRAM(S) 2-3 TIMES PER DAY AS NEEDED  . Estradiol (YUVAFEM) 10 MCG TABS vaginal tablet Place 1 tablet (10 mcg total) vaginally 2 (two) times a week.  . flecainide (TAMBOCOR) 50 MG tablet Take 1 tablet (50 mg total) by mouth 2 (two) times daily.  . Menthol (BIOFREEZE) 10 % AERO Apply 1 application topically daily as needed (Muscle pain).  . metoprolol succinate (TOPROL-XL) 25 MG 24 hr tablet Take 25 mg by mouth at bedtime.   . Nutritional  Supplements (JUICE PLUS FIBRE PO) Juice plus Veggie  1 tablet by mouth twice daily  Juice plus Fruit   1 tablet by mouth twice daily  Juice plus Vineyard 1 tablet by mouth twice daily  . telmisartan-hydrochlorothiazide (MICARDIS HCT) 40-12.5 MG tablet Take 1 tablet by mouth every morning.   . [DISCONTINUED] CRANBERRY PO Take 1 tablet by mouth daily.   . [DISCONTINUED] Omega-3 Fatty Acids (FISH OIL PO) Take 1 capsule by mouth daily.     Allergies:   Nsaids   Social History   Socioeconomic History  . Marital status: Married    Spouse name: Not on file  . Number of children: Not on file  . Years of education: Not on file  . Highest education level: Not on file  Occupational History  . Not on file  Social Needs  . Financial resource strain: Not on file  . Food insecurity:    Worry: Not on file    Inability: Not on file  . Transportation needs:    Medical: Not on file    Non-medical: Not on file  Tobacco Use  . Smoking status: Never Smoker  . Smokeless tobacco: Never Used  Substance and Sexual Activity  . Alcohol use: No    Alcohol/week: 0.0 oz  . Drug use: No  . Sexual activity: Never    Partners: Male    Birth control/protection: Post-menopausal  Lifestyle  . Physical activity:    Days per week: Not on file    Minutes per session: Not on file  . Stress: Not on file  Relationships  . Social connections:    Talks on phone: Not on file    Gets together: Not on file    Attends religious service: Not on file    Active member of club or organization: Not on file    Attends meetings of clubs or organizations: Not on file    Relationship status: Not on file  Other Topics Concern  . Not on file  Social History Narrative  . Not on file     Family History: The patient's family history includes Cancer in her maternal grandmother; Diabetes in her paternal grandmother; Heart attack in her father; Stroke in her mother.  ROS:   Please see the history of present illness.      All other systems reviewed and are negative.  EKGs/Labs/Other Studies Reviewed:    The following studies were reviewed today: EKG reveals sinus rhythm and is largely unremarkable and unchanged from previous EKG.   Recent Labs: 05/13/2017: TSH 1.613 02/21/2018: BUN 11; Creatinine, Ser 0.80; Hemoglobin 13.1; Magnesium 2.5; Platelets 212; Potassium 4.1; Sodium 143  Recent Lipid Panel No results found for: CHOL, TRIG, HDL, CHOLHDL, VLDL, LDLCALC, LDLDIRECT  Physical Exam:    VS:  BP (!) 146/72 (BP Location: Left Arm, Patient Position:  Sitting, Cuff Size: Normal)   Pulse 85   Ht 5\' 3"  (1.6 m)   Wt 171 lb 10.9 oz (77.9 kg)   LMP 09/12/2002   SpO2 97%   BMI 30.41 kg/m     Wt Readings from Last 3 Encounters:  04/11/18 171 lb 10.9 oz (77.9 kg)  02/14/18 169 lb (76.7 kg)  11/08/17 169 lb (76.7 kg)     GEN: Patient is in no acute distress HEENT: Normal NECK: No JVD; No carotid bruits LYMPHATICS: No lymphadenopathy CARDIAC: Hear sounds regular, 2/6 systolic murmur at the apex. RESPIRATORY:  Clear to auscultation without rales, wheezing or rhonchi  ABDOMEN: Soft, non-tender, non-distended MUSCULOSKELETAL:  No edema; No deformity  SKIN: Warm and dry NEUROLOGIC:  Alert and oriented x 3 PSYCHIATRIC:  Normal affect   Signed, Jenean Lindau, MD  04/11/2018 3:44 PM    [facility]

## 2018-04-12 LAB — BASIC METABOLIC PANEL
BUN / CREAT RATIO: 9 — AB (ref 12–28)
BUN: 8 mg/dL (ref 8–27)
CO2: 27 mmol/L (ref 20–29)
CREATININE: 0.91 mg/dL (ref 0.57–1.00)
Calcium: 10.2 mg/dL (ref 8.7–10.3)
Chloride: 100 mmol/L (ref 96–106)
GFR calc Af Amer: 76 mL/min/{1.73_m2} (ref >59)
GFR calc non Af Amer: 66 mL/min/{1.73_m2} (ref >59)
GLUCOSE: 121 mg/dL — AB (ref 65–99)
Potassium: 3.7 mmol/L (ref 3.5–5.2)
Sodium: 143 mmol/L (ref 134–144)

## 2018-04-18 DIAGNOSIS — M9902 Segmental and somatic dysfunction of thoracic region: Secondary | ICD-10-CM | POA: Diagnosis not present

## 2018-04-18 DIAGNOSIS — M5136 Other intervertebral disc degeneration, lumbar region: Secondary | ICD-10-CM | POA: Diagnosis not present

## 2018-04-18 DIAGNOSIS — M5442 Lumbago with sciatica, left side: Secondary | ICD-10-CM | POA: Diagnosis not present

## 2018-04-18 DIAGNOSIS — M546 Pain in thoracic spine: Secondary | ICD-10-CM | POA: Diagnosis not present

## 2018-04-18 DIAGNOSIS — M9903 Segmental and somatic dysfunction of lumbar region: Secondary | ICD-10-CM | POA: Diagnosis not present

## 2018-04-25 DIAGNOSIS — M9903 Segmental and somatic dysfunction of lumbar region: Secondary | ICD-10-CM | POA: Diagnosis not present

## 2018-04-25 DIAGNOSIS — M546 Pain in thoracic spine: Secondary | ICD-10-CM | POA: Diagnosis not present

## 2018-04-25 DIAGNOSIS — M5442 Lumbago with sciatica, left side: Secondary | ICD-10-CM | POA: Diagnosis not present

## 2018-04-25 DIAGNOSIS — M5136 Other intervertebral disc degeneration, lumbar region: Secondary | ICD-10-CM | POA: Diagnosis not present

## 2018-04-25 DIAGNOSIS — M9902 Segmental and somatic dysfunction of thoracic region: Secondary | ICD-10-CM | POA: Diagnosis not present

## 2018-05-11 DIAGNOSIS — M9903 Segmental and somatic dysfunction of lumbar region: Secondary | ICD-10-CM | POA: Diagnosis not present

## 2018-05-11 DIAGNOSIS — M5442 Lumbago with sciatica, left side: Secondary | ICD-10-CM | POA: Diagnosis not present

## 2018-05-11 DIAGNOSIS — M5136 Other intervertebral disc degeneration, lumbar region: Secondary | ICD-10-CM | POA: Diagnosis not present

## 2018-05-11 DIAGNOSIS — M9902 Segmental and somatic dysfunction of thoracic region: Secondary | ICD-10-CM | POA: Diagnosis not present

## 2018-05-11 DIAGNOSIS — M546 Pain in thoracic spine: Secondary | ICD-10-CM | POA: Diagnosis not present

## 2018-05-17 ENCOUNTER — Telehealth: Payer: Self-pay | Admitting: Cardiology

## 2018-05-17 MED ORDER — FLECAINIDE ACETATE 50 MG PO TABS: 50.0000 mg | ORAL_TABLET | Freq: Two times a day (BID) | ORAL | 3 refills | Status: DC

## 2018-05-17 NOTE — Telephone Encounter (Signed)
[facility] [place]

## 2018-05-17 NOTE — Telephone Encounter (Signed)
Refill sent.

## 2018-06-02 DIAGNOSIS — M5136 Other intervertebral disc degeneration, lumbar region: Secondary | ICD-10-CM | POA: Diagnosis not present

## 2018-06-02 DIAGNOSIS — M546 Pain in thoracic spine: Secondary | ICD-10-CM | POA: Diagnosis not present

## 2018-06-02 DIAGNOSIS — M9903 Segmental and somatic dysfunction of lumbar region: Secondary | ICD-10-CM | POA: Diagnosis not present

## 2018-06-02 DIAGNOSIS — M9902 Segmental and somatic dysfunction of thoracic region: Secondary | ICD-10-CM | POA: Diagnosis not present

## 2018-06-02 DIAGNOSIS — M5442 Lumbago with sciatica, left side: Secondary | ICD-10-CM | POA: Diagnosis not present

## 2018-07-04 DIAGNOSIS — L820 Inflamed seborrheic keratosis: Secondary | ICD-10-CM | POA: Diagnosis not present

## 2018-07-04 DIAGNOSIS — D2239 Melanocytic nevi of other parts of face: Secondary | ICD-10-CM | POA: Diagnosis not present

## 2018-07-14 DIAGNOSIS — Z79899 Other long term (current) drug therapy: Secondary | ICD-10-CM | POA: Diagnosis not present

## 2018-07-14 DIAGNOSIS — R5383 Other fatigue: Secondary | ICD-10-CM | POA: Diagnosis not present

## 2018-07-14 DIAGNOSIS — Z6830 Body mass index (BMI) 30.0-30.9, adult: Secondary | ICD-10-CM | POA: Diagnosis not present

## 2018-07-14 DIAGNOSIS — G4700 Insomnia, unspecified: Secondary | ICD-10-CM | POA: Diagnosis not present

## 2018-08-09 ENCOUNTER — Other Ambulatory Visit: Payer: Self-pay

## 2018-08-09 ENCOUNTER — Telehealth: Payer: Self-pay | Admitting: Cardiology

## 2018-08-09 MED ORDER — APIXABAN 5 MG PO TABS: 5.0000 mg | ORAL_TABLET | Freq: Two times a day (BID) | ORAL | 2 refills | Status: DC

## 2018-08-09 MED ORDER — ATORVASTATIN CALCIUM 10 MG PO TABS: 10.0000 mg | ORAL_TABLET | Freq: Every day | ORAL | 2 refills | Status: DC

## 2018-08-09 MED ORDER — FLECAINIDE ACETATE 50 MG PO TABS: 50.0000 mg | ORAL_TABLET | Freq: Two times a day (BID) | ORAL | 2 refills | Status: DC

## 2018-08-09 NOTE — Telephone Encounter (Signed)
Med refills have been sent.

## 2018-08-09 NOTE — Telephone Encounter (Signed)
Needs atorvastatin 10mg , Flecinide 50mg  and Eliquis 5 mg called to [facility] [place]

## 2018-08-10 DIAGNOSIS — M5136 Other intervertebral disc degeneration, lumbar region: Secondary | ICD-10-CM | POA: Diagnosis not present

## 2018-08-10 DIAGNOSIS — M546 Pain in thoracic spine: Secondary | ICD-10-CM | POA: Diagnosis not present

## 2018-08-10 DIAGNOSIS — M9903 Segmental and somatic dysfunction of lumbar region: Secondary | ICD-10-CM | POA: Diagnosis not present

## 2018-08-10 DIAGNOSIS — M9902 Segmental and somatic dysfunction of thoracic region: Secondary | ICD-10-CM | POA: Diagnosis not present

## 2018-08-10 DIAGNOSIS — M5442 Lumbago with sciatica, left side: Secondary | ICD-10-CM | POA: Diagnosis not present

## 2018-08-18 ENCOUNTER — Other Ambulatory Visit: Payer: Self-pay

## 2018-08-18 ENCOUNTER — Telehealth: Payer: Self-pay | Admitting: Cardiology

## 2018-08-18 MED ORDER — FLECAINIDE ACETATE 50 MG PO TABS: 50.0000 mg | ORAL_TABLET | Freq: Two times a day (BID) | ORAL | 2 refills | Status: DC

## 2018-08-18 NOTE — Telephone Encounter (Signed)
Med refill has been completed.  ?

## 2018-08-18 NOTE — Telephone Encounter (Signed)
°  Patient in currently out of meds  1. Which medications need to be refilled? (please list name of each medication and dose if known) flecainide 50mg   2. Which pharmacy/location (including street and city if local pharmacy) is [facility] to[facility] [place]  3. Do they need a 30 day or 90 day supply? 90 day supply

## 2018-09-07 DIAGNOSIS — M9902 Segmental and somatic dysfunction of thoracic region: Secondary | ICD-10-CM | POA: Diagnosis not present

## 2018-09-07 DIAGNOSIS — M9903 Segmental and somatic dysfunction of lumbar region: Secondary | ICD-10-CM | POA: Diagnosis not present

## 2018-09-07 DIAGNOSIS — M5136 Other intervertebral disc degeneration, lumbar region: Secondary | ICD-10-CM | POA: Diagnosis not present

## 2018-09-07 DIAGNOSIS — M546 Pain in thoracic spine: Secondary | ICD-10-CM | POA: Diagnosis not present

## 2018-09-07 DIAGNOSIS — M5442 Lumbago with sciatica, left side: Secondary | ICD-10-CM | POA: Diagnosis not present

## 2018-09-14 ENCOUNTER — Encounter: Payer: Self-pay | Admitting: Cardiology

## 2018-09-14 ENCOUNTER — Ambulatory Visit (INDEPENDENT_AMBULATORY_CARE_PROVIDER_SITE_OTHER): Payer: Medicare Other | Admitting: Cardiology

## 2018-09-14 VITALS — BP 122/76 | HR 63 | Ht 63.0 in | Wt 172.0 lb

## 2018-09-14 DIAGNOSIS — I10 Essential (primary) hypertension: Secondary | ICD-10-CM | POA: Diagnosis not present

## 2018-09-14 DIAGNOSIS — I471 Supraventricular tachycardia: Secondary | ICD-10-CM | POA: Diagnosis not present

## 2018-09-14 DIAGNOSIS — I480 Paroxysmal atrial fibrillation: Secondary | ICD-10-CM

## 2018-09-14 DIAGNOSIS — I2510 Atherosclerotic heart disease of native coronary artery without angina pectoris: Secondary | ICD-10-CM

## 2018-09-14 NOTE — Patient Instructions (Addendum)
Medication Instructions:  Your physician recommends that you continue on your current medications as directed. Please refer to the Current Medication list given to you today.   Labwork: Your physician recommends that you return for lab work in a month: BMP, LFT and lipid profile   Testing/Procedures: None  Follow-Up: Your physician recommends that you schedule a follow-up appointment in: 6 months   Any Other Special Instructions Will Be Listed Below (If Applicable).     If you need a refill on your cardiac medications before your next appointment, please call your pharmacy.

## 2018-09-14 NOTE — Addendum Note (Signed)
Addended by: Tarri Glenn on: 09/14/2018 02:39 PM   Modules accepted: Orders

## 2018-09-14 NOTE — Progress Notes (Signed)
Cardiology Office Note:    Date:  09/14/2018   ID:  Ashley Mcdonald, DOB 1951/07/11, MRN [id number]  PCP:  Myer Peer, MD  Cardiologist:  Jenean Lindau, MD   Referring MD: Myer Peer, MD    ASSESSMENT:    1. Coronary artery disease involving native coronary artery of native heart without angina pectoris   2. Essential hypertension   3. PAF (paroxysmal atrial fibrillation) ([place])   4. Supraventricular tachycardia ([place])    PLAN:    In order of problems listed above:  1. Secondary prevention stressed with the patient.  Importance of compliance with diet and medication stressed and she vocalized understanding.  Blood pressure stable.  Diet was discussed with dyslipidemia.  Her LDL is mildly elevated and she is trying to diet better.  She will be back in a month for liver lipid check.  Weight reduction was stressed.  Risks of obesity explained. 2. Patient will be seen in follow-up appointment in 6 months or earlier if the patient has any concerns    Medication Adjustments/Labs and Tests Ordered: Current medicines are reviewed at length with the patient today.  Concerns regarding medicines are outlined above.  No orders of the defined types were placed in this encounter.  No orders of the defined types were placed in this encounter.    No chief complaint on file.    History of Present Illness:    Ashley Mcdonald is a 67 y.o. female.  Patient has paroxysmal atrial fibrillation.  She denies any problems at this time and takes care of activities of daily living.  No chest pain orthopnea or PND.  She has been lax with her exercise because of plantar fasciitis issues.  At the time of my evaluation, the patient is alert awake oriented and in no distress.  Past Medical History:  Diagnosis Date  . Arthritis   . Complication of anesthesia   . Cystic breast   . Heart murmur    " nothing to be concerned about"  . Hyperlipidemia   . Hypertension   . Norovirus   . Plantar  fasciitis 2006   right  . PONV (postoperative nausea and vomiting)   . Puncture wound    right leg  . Shortness of breath dyspnea    with exertion    Past Surgical History:  Procedure Laterality Date  . COLONOSCOPY    . HYSTEROSCOPY     D&C polypectomy  . LEFT HEART CATH AND CORONARY ANGIOGRAPHY N/A 08/23/2017   Procedure: LEFT HEART CATH AND CORONARY ANGIOGRAPHY;  Surgeon: Belva Crome, MD;  Location: [facility];  Service: Cardiovascular;  Laterality: N/A;  . SPINAL FUSION  2008   c5-7  . TOTAL HIP ARTHROPLASTY Left 03/22/2016   Procedure: TOTAL HIP ARTHROPLASTY ANTERIOR APPROACH;  Surgeon: Frederik Pear, MD;  Location: [facility];  Service: Orthopedics;  Laterality: Left;    Current Medications: Current Meds  Medication Sig  . acetaminophen (TYLENOL) 650 MG CR tablet Take 650 mg by mouth at bedtime as needed for pain.  Marland Kitchen apixaban (ELIQUIS) 5 MG TABS tablet Take 1 tablet (5 mg total) by mouth 2 (two) times daily.  Marland Kitchen atorvastatin (LIPITOR) 10 MG tablet Take 1 tablet (10 mg total) by mouth daily at 6 PM.  . Calcium Carbonate-Vitamin D (CALCIUM-D PO) Take 3 tablets by mouth every evening.  . cyclobenzaprine (FLEXERIL) 5 MG tablet Take 5 mg by mouth daily as needed for muscle spasms.  Marland Kitchen  diclofenac sodium (VOLTAREN) 1 % GEL APPLY 2 GRAM(S) 2-3 TIMES PER DAY AS NEEDED  . Estradiol (YUVAFEM) 10 MCG TABS vaginal tablet Place 1 tablet (10 mcg total) vaginally 2 (two) times a week.  . flecainide (TAMBOCOR) 50 MG tablet Take 1 tablet (50 mg total) by mouth 2 (two) times daily.  . Menthol (BIOFREEZE) 10 % AERO Apply 1 application topically daily as needed (Muscle pain).  . metoprolol succinate (TOPROL-XL) 25 MG 24 hr tablet Take 25 mg by mouth at bedtime.   . nitrofurantoin, macrocrystal-monohydrate, (MACROBID) 100 MG capsule Take 1 capsule by mouth 2 (two) times daily.  . Nutritional Supplements (JUICE PLUS FIBRE PO) Juice plus Veggie  1 tablet by mouth twice daily  Juice plus Fruit   1  tablet by mouth twice daily  Juice plus Vineyard 1 tablet by mouth twice daily  . telmisartan-hydrochlorothiazide (MICARDIS HCT) 40-12.5 MG tablet Take 1 tablet by mouth every morning.      Allergies:   Nsaids   Social History   Socioeconomic History  . Marital status: Married    Spouse name: Not on file  . Number of children: Not on file  . Years of education: Not on file  . Highest education level: Not on file  Occupational History  . Not on file  Social Needs  . Financial resource strain: Not on file  . Food insecurity:    Worry: Not on file    Inability: Not on file  . Transportation needs:    Medical: Not on file    Non-medical: Not on file  Tobacco Use  . Smoking status: Never Smoker  . Smokeless tobacco: Never Used  Substance and Sexual Activity  . Alcohol use: No    Alcohol/week: 0.0 standard drinks  . Drug use: No  . Sexual activity: Never    Partners: Male    Birth control/protection: Post-menopausal  Lifestyle  . Physical activity:    Days per week: Not on file    Minutes per session: Not on file  . Stress: Not on file  Relationships  . Social connections:    Talks on phone: Not on file    Gets together: Not on file    Attends religious service: Not on file    Active member of club or organization: Not on file    Attends meetings of clubs or organizations: Not on file    Relationship status: Not on file  Other Topics Concern  . Not on file  Social History Narrative  . Not on file     Family History: The patient's family history includes Cancer in her maternal grandmother; Diabetes in her paternal grandmother; Heart attack in her father; Stroke in her mother.  ROS:   Please see the history of present illness.    All other systems reviewed and are negative.  EKGs/Labs/Other Studies Reviewed:    The following studies were reviewed today: I discussed my findings with the patient at extensive length.  EKG reveals sinus rhythm and nonspecific ST-T  changes.   Recent Labs: 02/21/2018: Hemoglobin 13.1; Magnesium 2.5; Platelets 212 04/11/2018: BUN 8; Creatinine, Ser 0.91; Potassium 3.7; Sodium 143  Recent Lipid Panel No results found for: CHOL, TRIG, HDL, CHOLHDL, VLDL, LDLCALC, LDLDIRECT  Physical Exam:    VS:  BP 122/76 (BP Location: Right Arm, Patient Position: Sitting, Cuff Size: Normal)   Pulse 63   Ht 5\' 3"  (1.6 m)   Wt 172 lb (78 kg)   LMP 09/12/2002  SpO2 96%   BMI 30.47 kg/m     Wt Readings from Last 3 Encounters:  09/14/18 172 lb (78 kg)  04/11/18 171 lb 10.9 oz (77.9 kg)  02/14/18 169 lb (76.7 kg)     GEN: Patient is in no acute distress HEENT: Normal NECK: No JVD; No carotid bruits LYMPHATICS: No lymphadenopathy CARDIAC: Hear sounds regular, 2/6 systolic murmur at the apex. RESPIRATORY:  Clear to auscultation without rales, wheezing or rhonchi  ABDOMEN: Soft, non-tender, non-distended MUSCULOSKELETAL:  No edema; No deformity  SKIN: Warm and dry NEUROLOGIC:  Alert and oriented x 3 PSYCHIATRIC:  Normal affect   Signed, Jenean Lindau, MD  09/14/2018 11:37 AM    [place]

## 2018-10-03 DIAGNOSIS — L821 Other seborrheic keratosis: Secondary | ICD-10-CM | POA: Diagnosis not present

## 2018-10-03 DIAGNOSIS — D1801 Hemangioma of skin and subcutaneous tissue: Secondary | ICD-10-CM | POA: Diagnosis not present

## 2018-10-03 DIAGNOSIS — D225 Melanocytic nevi of trunk: Secondary | ICD-10-CM | POA: Diagnosis not present

## 2018-10-03 DIAGNOSIS — D2239 Melanocytic nevi of other parts of face: Secondary | ICD-10-CM | POA: Diagnosis not present

## 2018-10-03 DIAGNOSIS — L820 Inflamed seborrheic keratosis: Secondary | ICD-10-CM | POA: Diagnosis not present

## 2018-10-03 DIAGNOSIS — C441121 Basal cell carcinoma of skin of right upper eyelid, including canthus: Secondary | ICD-10-CM | POA: Diagnosis not present

## 2018-10-13 ENCOUNTER — Other Ambulatory Visit: Payer: Self-pay | Admitting: Certified Nurse Midwife

## 2018-10-13 DIAGNOSIS — N952 Postmenopausal atrophic vaginitis: Secondary | ICD-10-CM

## 2018-10-13 NOTE — Telephone Encounter (Signed)
Medication refill request: Yuvafem Last AEX:  11/08/18 Next AEX: 11/17/18 Last MMG (if hormonal medication request):  03/13/18 Bi-rads 1 neg  Refill authorized: #8 with 0rf Please refill until aex if appropriate.

## 2018-10-19 DIAGNOSIS — M9902 Segmental and somatic dysfunction of thoracic region: Secondary | ICD-10-CM | POA: Diagnosis not present

## 2018-10-19 DIAGNOSIS — M5136 Other intervertebral disc degeneration, lumbar region: Secondary | ICD-10-CM | POA: Diagnosis not present

## 2018-10-19 DIAGNOSIS — M5442 Lumbago with sciatica, left side: Secondary | ICD-10-CM | POA: Diagnosis not present

## 2018-10-19 DIAGNOSIS — M546 Pain in thoracic spine: Secondary | ICD-10-CM | POA: Diagnosis not present

## 2018-10-19 DIAGNOSIS — M9903 Segmental and somatic dysfunction of lumbar region: Secondary | ICD-10-CM | POA: Diagnosis not present

## 2018-10-20 ENCOUNTER — Other Ambulatory Visit: Payer: Self-pay

## 2018-10-20 DIAGNOSIS — N952 Postmenopausal atrophic vaginitis: Secondary | ICD-10-CM

## 2018-10-20 NOTE — Telephone Encounter (Signed)
Medication refill request: Yuvafem 10 MCG  Last AEX: 11/08/17 Next AEX: 12/07/18 Last MMG (if hormonal medication request): 03/13/18 Bi-rads 1 Neg  Refill authorized: #8 with 0 RF

## 2018-10-21 MED ORDER — ESTRADIOL 10 MCG VA TABS: ORAL_TABLET | VAGINAL | 0 refills | Status: DC

## 2018-10-24 ENCOUNTER — Telehealth: Payer: Self-pay | Admitting: Certified Nurse Midwife

## 2018-10-25 ENCOUNTER — Ambulatory Visit (INDEPENDENT_AMBULATORY_CARE_PROVIDER_SITE_OTHER): Payer: Medicare Other | Admitting: Certified Nurse Midwife

## 2018-10-25 ENCOUNTER — Other Ambulatory Visit: Payer: Self-pay | Admitting: Certified Nurse Midwife

## 2018-10-25 ENCOUNTER — Other Ambulatory Visit: Payer: Self-pay

## 2018-10-25 ENCOUNTER — Encounter: Payer: Self-pay | Admitting: Certified Nurse Midwife

## 2018-10-25 ENCOUNTER — Ambulatory Visit: Payer: Medicare Other | Admitting: Certified Nurse Midwife

## 2018-10-25 VITALS — BP 124/78 | HR 70 | Resp 16 | Wt 175.0 lb

## 2018-10-25 DIAGNOSIS — I2510 Atherosclerotic heart disease of native coronary artery without angina pectoris: Secondary | ICD-10-CM | POA: Diagnosis not present

## 2018-10-25 DIAGNOSIS — N898 Other specified noninflammatory disorders of vagina: Secondary | ICD-10-CM

## 2018-10-25 DIAGNOSIS — R350 Frequency of micturition: Secondary | ICD-10-CM | POA: Diagnosis not present

## 2018-10-25 LAB — POCT URINALYSIS DIPSTICK
BILIRUBIN UA: NEGATIVE
Blood, UA: NEGATIVE
GLUCOSE UA: NEGATIVE
KETONES UA: NEGATIVE
Leukocytes, UA: NEGATIVE
Nitrite, UA: NEGATIVE
Protein, UA: NEGATIVE
Urobilinogen, UA: NEGATIVE E.U./dL — AB
pH, UA: 5 (ref 5.0–8.0)

## 2018-10-25 NOTE — Progress Notes (Signed)
67 y.o. Married Caucasian female G1P0010 here with complaint of vaginal symptoms of itching, slight burning and irritation. Has been using Yuvafem for vaginal dryness. Recent diagnosis of cardiac disease and on blood thinner with cardiac follow up.  Review of Systems  Constitutional: Negative.   HENT: Negative.   Eyes: Negative.   Respiratory: Negative.   Cardiovascular: Negative.   Gastrointestinal: Negative.   Genitourinary: Negative for dysuria and urgency.       Vaginal dryness and irritation. Took antibiotics for dental work and feel this is the cause of vaginal issue.  Musculoskeletal: Negative.   Skin: Negative.   Neurological: Negative.   Endo/Heme/Allergies: Negative.   Psychiatric/Behavioral: Negative.     O:Healthy female WDWN Affect: normal, orientation x 3  Exam: Skin: warm and dry Abdomen:soft, non tender  Inguinal Lymph nodes: no enlargement or tenderness Pelvic exam: External genital: normal female BUS: negative Vagina: scant nonodorous discharge noted. Atrophic appearance Affirm taken Cervix: normal, non tender, no CMT Uterus: normal, non tender Adnexa:normal, non tender, no masses or fullness noted   A:Normal pelvic exam R/O vaginal infection Atrophic vaginitis previous Yuvafem use New diagnosis of heart disease on Eliquis   P:Discussed findings of scant discharge and etiology. Discussed Aveeno sitz bath for comfort. Avoid moist clothes or pads for extended period of time. Coconut Oil use for skin protection prior to activity can be used to external skin for protection or dryness. Discussed she needs to stop Yuvafem use due to estrogen risk with her cardiac diagnosis. Patient given options to use, including Replens. Will advise at aex status. Lab: Affirm will treat if indicated   Rv prn

## 2018-10-26 LAB — VAGINITIS/VAGINOSIS, DNA PROBE
CANDIDA SPECIES: NEGATIVE
GARDNERELLA VAGINALIS: NEGATIVE
Trichomonas vaginosis: NEGATIVE

## 2018-10-27 ENCOUNTER — Other Ambulatory Visit: Payer: Self-pay

## 2018-10-31 DIAGNOSIS — Z23 Encounter for immunization: Secondary | ICD-10-CM | POA: Diagnosis not present

## 2018-11-02 DIAGNOSIS — I10 Essential (primary) hypertension: Secondary | ICD-10-CM | POA: Diagnosis not present

## 2018-11-02 DIAGNOSIS — I480 Paroxysmal atrial fibrillation: Secondary | ICD-10-CM | POA: Diagnosis not present

## 2018-11-02 LAB — LIPID PANEL
Chol/HDL Ratio: 3.4 ratio (ref 0.0–4.4)
Cholesterol, Total: 138 mg/dL (ref 100–199)
HDL: 41 mg/dL (ref >39)
LDL CALC: 79 mg/dL (ref 0–99)
Triglycerides: 92 mg/dL (ref 0–149)
VLDL Cholesterol Cal: 18 mg/dL (ref 5–40)

## 2018-11-02 LAB — BASIC METABOLIC PANEL
BUN / CREAT RATIO: 12 (ref 12–28)
BUN: 11 mg/dL (ref 8–27)
CO2: 24 mmol/L (ref 20–29)
CREATININE: 0.9 mg/dL (ref 0.57–1.00)
Calcium: 9.8 mg/dL (ref 8.7–10.3)
Chloride: 100 mmol/L (ref 96–106)
GFR calc Af Amer: 77 mL/min/{1.73_m2} (ref >59)
GFR calc non Af Amer: 66 mL/min/{1.73_m2} (ref >59)
GLUCOSE: 92 mg/dL (ref 65–99)
POTASSIUM: 4.8 mmol/L (ref 3.5–5.2)
SODIUM: 140 mmol/L (ref 134–144)

## 2018-11-02 LAB — HEPATIC FUNCTION PANEL
ALK PHOS: 92 IU/L (ref 39–117)
ALT: 18 IU/L (ref 0–32)
AST: 22 IU/L (ref 0–40)
Albumin: 4.5 g/dL (ref 3.6–4.8)
Bilirubin Total: 0.5 mg/dL (ref 0.0–1.2)
Bilirubin, Direct: 0.14 mg/dL (ref 0.00–0.40)
TOTAL PROTEIN: 6.5 g/dL (ref 6.0–8.5)

## 2018-11-06 ENCOUNTER — Other Ambulatory Visit: Payer: Self-pay | Admitting: Certified Nurse Midwife

## 2018-11-06 DIAGNOSIS — M546 Pain in thoracic spine: Secondary | ICD-10-CM | POA: Diagnosis not present

## 2018-11-06 DIAGNOSIS — M5136 Other intervertebral disc degeneration, lumbar region: Secondary | ICD-10-CM | POA: Diagnosis not present

## 2018-11-06 DIAGNOSIS — M5442 Lumbago with sciatica, left side: Secondary | ICD-10-CM | POA: Diagnosis not present

## 2018-11-06 DIAGNOSIS — M9903 Segmental and somatic dysfunction of lumbar region: Secondary | ICD-10-CM | POA: Diagnosis not present

## 2018-11-06 DIAGNOSIS — N952 Postmenopausal atrophic vaginitis: Secondary | ICD-10-CM

## 2018-11-06 DIAGNOSIS — M9902 Segmental and somatic dysfunction of thoracic region: Secondary | ICD-10-CM | POA: Diagnosis not present

## 2018-11-17 ENCOUNTER — Ambulatory Visit (INDEPENDENT_AMBULATORY_CARE_PROVIDER_SITE_OTHER): Payer: Medicare Other | Admitting: Certified Nurse Midwife

## 2018-11-17 ENCOUNTER — Other Ambulatory Visit: Payer: Self-pay

## 2018-11-17 ENCOUNTER — Encounter: Payer: Self-pay | Admitting: Certified Nurse Midwife

## 2018-11-17 VITALS — BP 110/80 | HR 68 | Resp 16 | Ht 62.5 in | Wt 173.0 lb

## 2018-11-17 DIAGNOSIS — N951 Menopausal and female climacteric states: Secondary | ICD-10-CM

## 2018-11-17 DIAGNOSIS — Z01419 Encounter for gynecological examination (general) (routine) without abnormal findings: Secondary | ICD-10-CM | POA: Diagnosis not present

## 2018-11-17 DIAGNOSIS — E559 Vitamin D deficiency, unspecified: Secondary | ICD-10-CM

## 2018-11-17 NOTE — Patient Instructions (Signed)

## 2018-11-17 NOTE — Progress Notes (Signed)
67 y.o. G1P0010 Married  Caucasian Fe here for annual exam. Menopausal no HRT. Denies vaginal bleeding. Using Replens twice weekly and aveeno bath for comfort for external dryness working well. Seeing cardiologist as recommended and PCP for hypertension management and labs. All stable at present. No health issues today..  Patient's last menstrual period was 09/12/2002.          Sexually active: Yes.    The current method of family planning is post menopausal status.    Exercising: Yes.    walking Smoker:  no  Review of Systems  Constitutional: Negative.   HENT: Negative.   Eyes: Negative.   Respiratory: Negative.   Cardiovascular: Negative.   Gastrointestinal: Negative.   Genitourinary: Negative.   Musculoskeletal: Positive for joint pain and myalgias.  Skin: Negative.   Neurological: Negative.   Endo/Heme/Allergies: Negative.   Psychiatric/Behavioral: Negative.     Health Maintenance: Pap:  10-29-16 neg History of Abnormal Pap: no MMG:  03-13-18 category c density birads 1:neg Self Breast exams: yes Colonoscopy:  2017 f/u 74yrs BMD:   2017 normal  TDaP:  2019 Shingles: had done Pneumonia: had done Hep C and HIV: both neg 2017 Labs: if needed.   reports that she has never smoked. She has never used smokeless tobacco. She reports that she does not drink alcohol or use drugs.  Past Medical History:  Diagnosis Date  . Arthritis   . Complication of anesthesia   . Coronary artery disease   . Cystic breast   . Heart murmur    " nothing to be concerned about"  . Hyperlipidemia   . Hypertension   . Norovirus   . Plantar fasciitis 2006   right  . PONV (postoperative nausea and vomiting)   . Puncture wound    right leg  . Shortness of breath dyspnea    with exertion  . Tachycardia     Past Surgical History:  Procedure Laterality Date  . COLONOSCOPY    . HYSTEROSCOPY     D&C polypectomy  . LEFT HEART CATH AND CORONARY ANGIOGRAPHY N/A 08/23/2017   Procedure: LEFT  HEART CATH AND CORONARY ANGIOGRAPHY;  Surgeon: Belva Crome, MD;  Location: [facility];  Service: Cardiovascular;  Laterality: N/A;  . SPINAL FUSION  2008   c5-7  . TOTAL HIP ARTHROPLASTY Left 03/22/2016   Procedure: TOTAL HIP ARTHROPLASTY ANTERIOR APPROACH;  Surgeon: Frederik Pear, MD;  Location: [facility];  Service: Orthopedics;  Laterality: Left;    Current Outpatient Medications  Medication Sig Dispense Refill  . acetaminophen (TYLENOL) 650 MG CR tablet Take 650 mg by mouth at bedtime as needed for pain.    Marland Kitchen apixaban (ELIQUIS) 5 MG TABS tablet Take 1 tablet (5 mg total) by mouth 2 (two) times daily. 180 tablet 2  . atorvastatin (LIPITOR) 10 MG tablet Take 10 mg by mouth daily.    . Calcium Carbonate-Vitamin D (CALCIUM-D PO) Take 3 tablets by mouth every evening.    . cyclobenzaprine (FLEXERIL) 5 MG tablet Take 5 mg by mouth daily as needed for muscle spasms.    . flecainide (TAMBOCOR) 50 MG tablet Take 1 tablet (50 mg total) by mouth 2 (two) times daily. 180 tablet 2  . Menthol (BIOFREEZE) 10 % AERO Apply 1 application topically daily as needed (Muscle pain).    . metoprolol succinate (TOPROL-XL) 25 MG 24 hr tablet Take 25 mg by mouth at bedtime.     Marland Kitchen telmisartan-hydrochlorothiazide (MICARDIS HCT) 40-12.5 MG tablet Take  1 tablet by mouth every morning.   3   No current facility-administered medications for this visit.     Family History  Problem Relation Age of Onset  . Stroke Mother   . Heart attack Father   . Cancer Maternal Grandmother        uterine?  . Diabetes Paternal Grandmother     ROS:  Pertinent items are noted in HPI.  Otherwise, a comprehensive ROS was negative.  Exam:   BP 110/80   Pulse 68   Resp 16   Ht 5' 2.5" (1.588 m)   Wt 173 lb (78.5 kg)   LMP 09/12/2002   BMI 31.14 kg/m  Height: 5' 2.5" (158.8 cm) Ht Readings from Last 3 Encounters:  11/17/18 5' 2.5" (1.588 m)  09/14/18 5\' 3"  (1.6 m)  04/11/18 5\' 3"  (1.6 m)    General appearance: alert,  cooperative and appears stated age Head: Normocephalic, without obvious abnormality, atraumatic Neck: no adenopathy, supple, symmetrical, trachea midline and thyroid normal to inspection and palpation Lungs: clear to auscultation bilaterally Breasts: normal appearance, no masses or tenderness, No nipple retraction or dimpling, No nipple discharge or bleeding, No axillary or supraclavicular adenopathy Heart: regular rate and rhythm Abdomen: soft, non-tender; no masses,  no organomegaly Extremities: extremities normal, atraumatic, no cyanosis or edema Skin: Skin color, texture, turgor normal. No rashes or lesions Lymph nodes: Cervical, supraclavicular, and axillary nodes normal. No abnormal inguinal nodes palpated Neurologic: Grossly normal   Pelvic: External genitalia:  no lesions              Urethra:  normal appearing urethra with no masses, tenderness or lesions              Bartholin's and Skene's: normal                 Vagina: normal appearing vagina with normal color and discharge, no lesions              Cervix: no cervical motion tenderness, no lesions and normal appearance              Pap taken: No. Bimanual Exam:  Uterus:  normal size, contour, position, consistency, mobility, non-tender and anteverted              Adnexa: normal adnexa and no mass, fullness, tenderness               Rectovaginal: Confirms               Anus:  normal sphincter tone, no lesions  Chaperone present: yes  A:  Well Woman with normal exam  Menopausal no HRT  Vaginal dryness Replens working well now  History of Coronary artery disease with cardiology management  BMD due, height change  P:   Reviewed health and wellness pertinent to exam  Aware of need to advise if vaginal bleeding  Continue current regimen and advise if problems  Continue MD follow up as indicated.  Will fax order so she can do with mammogram  Lab: Vitamin D  Pap smear: no   counseled on breast self exam, mammography  screening, feminine hygiene, adequate intake of calcium and vitamin D, diet and exercise, Kegel's exercises  return annually or prn  An After Visit Summary was printed and given to the patient.

## 2018-11-18 LAB — VITAMIN D 25 HYDROXY (VIT D DEFICIENCY, FRACTURES): VIT D 25 HYDROXY: 33.7 ng/mL (ref 30.0–100.0)

## 2018-11-27 DIAGNOSIS — D101 Benign neoplasm of tongue: Secondary | ICD-10-CM | POA: Diagnosis not present

## 2018-12-09 ENCOUNTER — Other Ambulatory Visit: Payer: Self-pay | Admitting: Certified Nurse Midwife

## 2018-12-09 DIAGNOSIS — N952 Postmenopausal atrophic vaginitis: Secondary | ICD-10-CM

## 2018-12-19 DIAGNOSIS — M9903 Segmental and somatic dysfunction of lumbar region: Secondary | ICD-10-CM | POA: Diagnosis not present

## 2018-12-19 DIAGNOSIS — M5442 Lumbago with sciatica, left side: Secondary | ICD-10-CM | POA: Diagnosis not present

## 2018-12-19 DIAGNOSIS — M5136 Other intervertebral disc degeneration, lumbar region: Secondary | ICD-10-CM | POA: Diagnosis not present

## 2018-12-19 DIAGNOSIS — M546 Pain in thoracic spine: Secondary | ICD-10-CM | POA: Diagnosis not present

## 2018-12-19 DIAGNOSIS — M9902 Segmental and somatic dysfunction of thoracic region: Secondary | ICD-10-CM | POA: Diagnosis not present

## 2018-12-28 ENCOUNTER — Telehealth: Payer: Self-pay | Admitting: Cardiology

## 2018-12-28 DIAGNOSIS — I480 Paroxysmal atrial fibrillation: Secondary | ICD-10-CM | POA: Diagnosis not present

## 2018-12-28 DIAGNOSIS — Z0000 Encounter for general adult medical examination without abnormal findings: Secondary | ICD-10-CM | POA: Diagnosis not present

## 2018-12-28 DIAGNOSIS — I10 Essential (primary) hypertension: Secondary | ICD-10-CM | POA: Diagnosis not present

## 2018-12-28 DIAGNOSIS — Z6831 Body mass index (BMI) 31.0-31.9, adult: Secondary | ICD-10-CM | POA: Diagnosis not present

## 2018-12-28 NOTE — Telephone Encounter (Signed)
Please call in all cardiac meds to patient's new mail order pharmacy [facility].. [facility]  Tel# [phone]

## 2018-12-29 ENCOUNTER — Other Ambulatory Visit: Payer: Self-pay

## 2018-12-29 MED ORDER — FLECAINIDE ACETATE 50 MG PO TABS: 50.0000 mg | ORAL_TABLET | Freq: Two times a day (BID) | ORAL | 2 refills | Status: DC

## 2018-12-29 MED ORDER — ATORVASTATIN CALCIUM 10 MG PO TABS: 10.0000 mg | ORAL_TABLET | Freq: Every day | ORAL | 2 refills | Status: DC

## 2018-12-29 MED ORDER — METOPROLOL SUCCINATE ER 25 MG PO TB24: 25.0000 mg | ORAL_TABLET | Freq: Every day | ORAL | 2 refills | Status: DC

## 2018-12-29 MED ORDER — APIXABAN 5 MG PO TABS: 5.0000 mg | ORAL_TABLET | Freq: Two times a day (BID) | ORAL | 2 refills | Status: DC

## 2019-01-04 ENCOUNTER — Telehealth: Payer: Self-pay | Admitting: Cardiology

## 2019-01-04 NOTE — Telephone Encounter (Signed)
Patient reports left sided chest pain that comes and goes at night along with shortness of breath. Advised patient to go to the emergency department if this returns. Otherwise I asked her to see [place] as scheduled and call with any other concerns. Patient verbally understands

## 2019-01-04 NOTE — Telephone Encounter (Signed)
Patient called stating she is having some pain under her left breast. She made an appt in [place] on 1/30. Please advise.

## 2019-01-08 ENCOUNTER — Ambulatory Visit: Payer: Medicare Other | Admitting: Podiatry

## 2019-01-10 ENCOUNTER — Encounter: Payer: Self-pay | Admitting: Cardiology

## 2019-01-10 ENCOUNTER — Ambulatory Visit (INDEPENDENT_AMBULATORY_CARE_PROVIDER_SITE_OTHER): Payer: Medicare Other | Admitting: Cardiology

## 2019-01-10 VITALS — BP 120/60 | HR 71 | Ht 62.5 in | Wt 177.0 lb

## 2019-01-10 DIAGNOSIS — I480 Paroxysmal atrial fibrillation: Secondary | ICD-10-CM | POA: Diagnosis not present

## 2019-01-10 DIAGNOSIS — I2510 Atherosclerotic heart disease of native coronary artery without angina pectoris: Secondary | ICD-10-CM | POA: Diagnosis not present

## 2019-01-10 DIAGNOSIS — I10 Essential (primary) hypertension: Secondary | ICD-10-CM

## 2019-01-10 NOTE — Progress Notes (Signed)
Cardiology Office Note:    Date:  01/10/2019   ID:  [name], DOB 1950-12-26, MRN [id number]  PCP:  Myer Peer, MD  Cardiologist:  Jenean Lindau, MD   Referring MD: Myer Peer, MD    ASSESSMENT:    1. Essential hypertension   2. 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 PAF (paroxysmal atrial fibrillation) (HCC)    PLAN:    In order of problems listed above:  1. Secondary prevention stressed with the patient.  Importance of compliance with diet and medication stressed and she vocalized understanding.  Her blood pressure is stable.  Importance of regular exercise stressed.  I reassured her about my findings.  She has atypical chest pain. 2. She will be back in the next few days for fasting blood work including lipids 3. Patient will be seen in follow-up appointment in 6 months or earlier if the patient has any concerns    Medication Adjustments/Labs and Tests Ordered: Current medicines are reviewed at length with the patient today.  Concerns regarding medicines are outlined above.  No orders of the defined types were placed in this encounter.  No orders of the defined types were placed in this encounter.    No chief complaint on file.    History of Present Illness:    Ashley Mcdonald is a 68 y.o. female.  Patient has history of essential hypertension and paroxysmal atrial fibrillation.  She has mild nonobstructive coronary artery disease by coronary angiography in 2018.  She came for follow-up.  She complains of chest pain.  She says that she feels it mostly when she presses on her chest.  No orthopnea or PND.  With ambulation or exertion the symptoms do not come on.  No radiation to any part of the body.  At the time of my evaluation, the patient is alert awake oriented and in no distress.  Past Medical History:  Diagnosis Date  . Arthritis   . Complication of anesthesia   . Coronary artery  disease   . Cystic breast   . Heart murmur    " nothing to be concerned about"  . Hyperlipidemia   . Hypertension   . Norovirus   . Plantar fasciitis 2006   right  . PONV (postoperative nausea and vomiting)   . Puncture wound    right leg  . Shortness of breath dyspnea    with exertion  . Tachycardia     Past Surgical History:  Procedure Laterality Date  . COLONOSCOPY    . HYSTEROSCOPY     D&C polypectomy  . LEFT HEART CATH AND CORONARY ANGIOGRAPHY N/A 08/23/2017   Procedure: LEFT HEART CATH AND CORONARY ANGIOGRAPHY;  Surgeon: Belva Crome, MD;  Location: [facility];  Service: Cardiovascular;  Laterality: N/A;  . SPINAL FUSION  2008   c5-7  . TOTAL HIP ARTHROPLASTY Left 03/22/2016   Procedure: TOTAL HIP ARTHROPLASTY ANTERIOR APPROACH;  Surgeon: Frederik Pear, MD;  Location: [place];  Service: Orthopedics;  Laterality: Left;    Current Medications: Current Meds  Medication Sig  . acetaminophen (TYLENOL) 650 MG CR tablet Take 650 mg by mouth at bedtime as needed for pain.  Marland Kitchen apixaban (ELIQUIS) 5 MG TABS tablet Take 1 tablet (5 mg total) by mouth 2 (two) times daily.  Marland Kitchen atorvastatin (LIPITOR) 10 MG tablet Take 1 tablet (10 mg total) by mouth daily.  . Calcium Carbonate-Vitamin D (  CALCIUM-D PO) Take 3 tablets by mouth every evening.  . cyclobenzaprine (FLEXERIL) 5 MG tablet Take 5 mg by mouth daily as needed for muscle spasms.  . flecainide (TAMBOCOR) 50 MG tablet Take 1 tablet (50 mg total) by mouth 2 (two) times daily.  . Melatonin 3 MG TABS Take 1 tablet by mouth at bedtime.  . Menthol (BIOFREEZE) 10 % AERO Apply 1 application topically daily as needed (Muscle pain).  . metoprolol succinate (TOPROL-XL) 25 MG 24 hr tablet Take 1 tablet (25 mg total) by mouth at bedtime.  Marland Kitchen telmisartan-hydrochlorothiazide (MICARDIS HCT) 40-12.5 MG tablet Take 1 tablet by mouth every morning.   . Vaginal Lubricant (REPLENS VA) Place vaginally.     Allergies:   Nsaids   Social History     Socioeconomic History  . Marital status: Married    Spouse name: Not on file  . Number of children: Not on file  . Years of education: Not on file  . Highest education level: Not on file  Occupational History  . Not on file  Social Needs  . Financial resource strain: Not on file  . Food insecurity:    Worry: Not on file    Inability: Not on file  . Transportation needs:    Medical: Not on file    Non-medical: Not on file  Tobacco Use  . Smoking status: Never Smoker  . Smokeless tobacco: Never Used  Substance and Sexual Activity  . Alcohol use: No    Alcohol/week: 0.0 standard drinks  . Drug use: No  . Sexual activity: Yes    Partners: Male    Birth control/protection: Post-menopausal  Lifestyle  . Physical activity:    Days per week: Not on file    Minutes per session: Not on file  . Stress: Not on file  Relationships  . Social connections:    Talks on phone: Not on file    Gets together: Not on file    Attends religious service: Not on file    Active member of club or organization: Not on file    Attends meetings of clubs or organizations: Not on file    Relationship status: Not on file  Other Topics Concern  . Not on file  Social History Narrative  . Not on file     Family History: The patient's family history includes Cancer in her maternal grandmother; Diabetes in her paternal grandmother; Heart attack in her father; Stroke in her mother.  ROS:   Please see the history of present illness.    All other systems reviewed and are negative.  EKGs/Labs/Other Studies Reviewed:    The following studies were reviewed today: I discussed my findings with the patient at extensive length and EKG was unremarkable and revealed sinus rhythm and nonspecific ST-T changes.   Recent Labs: 02/21/2018: Hemoglobin 13.1; Magnesium 2.5; Platelets 212 11/02/2018: ALT 18; BUN 11; Creatinine, Ser 0.90; Potassium 4.8; Sodium 140  Recent Lipid Panel    Component Value  Date/Time   CHOL 138 11/02/2018 1011   TRIG 92 11/02/2018 1011   HDL 41 11/02/2018 1011   CHOLHDL 3.4 11/02/2018 1011   LDLCALC 79 11/02/2018 1011    Physical Exam:    VS:  BP 120/60 (BP Location: Right Arm, Patient Position: Sitting, Cuff Size: Normal)   Pulse 71   Ht 5' 2.5" (1.588 m)   Wt 177 lb (80.3 kg)   LMP 09/12/2002   SpO2 96%   BMI 31.86 kg/m  Wt Readings from Last 3 Encounters:  01/10/19 177 lb (80.3 kg)  11/17/18 173 lb (78.5 kg)  10/25/18 175 lb (79.4 kg)     GEN: Patient is in no acute distress HEENT: Normal NECK: No JVD; No carotid bruits LYMPHATICS: No lymphadenopathy CARDIAC: Hear sounds regular, 2/6 systolic murmur at the apex. RESPIRATORY:  Clear to auscultation without rales, wheezing or rhonchi  ABDOMEN: Soft, non-tender, non-distended MUSCULOSKELETAL:  No edema; No deformity  SKIN: Warm and dry NEUROLOGIC:  Alert and oriented x 3 PSYCHIATRIC:  Normal affect   Signed, Jenean Lindau, MD  01/10/2019 3:51 PM    [facility]

## 2019-01-10 NOTE — Patient Instructions (Addendum)
Medication Instructions:  Your physician recommends that you continue on your current medications as directed. Please refer to the Current Medication list given to you today.  If you need a refill on your cardiac medications before your next appointment, please call your pharmacy.   Lab work: None If you have labs (blood work) drawn today and your tests are completely normal, you will receive your results only by: . MyChart Message (if you have MyChart) OR . A paper copy in the mail If you have any lab test that is abnormal or we need to change your treatment, we will call you to review the results.  Testing/Procedures: None  Follow-Up: At [facility], you and your health needs are our priority.  As part of our continuing mission to provide you with exceptional heart care, we have created designated Provider Care Teams.  These Care Teams include your primary Cardiologist (physician) and Advanced Practice Providers (APPs -  Physician Assistants and Nurse Practitioners) who all work together to provide you with the care you need, when you need it. You will need a follow up appointment in 6 months.  Please call our office 2 months in advance to schedule this appointment.  You may see No primary care provider on file. or another member of our [facility] Provider Team in [place]: Robert Krasowski, MD . Brian Munley, MD  Any Other Special Instructions Will Be Listed Below (If Applicable).    

## 2019-01-11 ENCOUNTER — Ambulatory Visit: Payer: Medicare Other | Admitting: Cardiology

## 2019-01-12 DIAGNOSIS — J028 Acute pharyngitis due to other specified organisms: Secondary | ICD-10-CM | POA: Diagnosis not present

## 2019-01-12 DIAGNOSIS — B9689 Other specified bacterial agents as the cause of diseases classified elsewhere: Secondary | ICD-10-CM | POA: Diagnosis not present

## 2019-01-15 ENCOUNTER — Ambulatory Visit (INDEPENDENT_AMBULATORY_CARE_PROVIDER_SITE_OTHER): Payer: Medicare Other | Admitting: Podiatry

## 2019-01-15 ENCOUNTER — Other Ambulatory Visit: Payer: Self-pay

## 2019-01-15 ENCOUNTER — Encounter: Payer: Self-pay | Admitting: Podiatry

## 2019-01-15 ENCOUNTER — Ambulatory Visit (INDEPENDENT_AMBULATORY_CARE_PROVIDER_SITE_OTHER): Payer: Medicare Other

## 2019-01-15 VITALS — Ht 63.0 in | Wt 174.0 lb

## 2019-01-15 DIAGNOSIS — M7752 Other enthesopathy of left foot: Secondary | ICD-10-CM

## 2019-01-15 DIAGNOSIS — M7742 Metatarsalgia, left foot: Secondary | ICD-10-CM

## 2019-01-15 DIAGNOSIS — G5762 Lesion of plantar nerve, left lower limb: Secondary | ICD-10-CM

## 2019-01-15 NOTE — Patient Instructions (Signed)

## 2019-01-15 NOTE — Progress Notes (Signed)
   Subjective:    Patient ID: Ashley Mcdonald, female    DOB: 1951-10-03, 68 y.o.   MRN: [id number]  HPI    Review of Systems  All other systems reviewed and are negative.      Objective:   Physical Exam        Assessment & Plan:

## 2019-01-15 NOTE — Progress Notes (Signed)
Subjective:  Patient ID: Ashley Mcdonald, female    DOB: May 04, 1951,  MRN: [id number]  Chief Complaint  Patient presents with  . Pain    L 2nd-4th toes (tip)  and plantar forefoot below 2nd-4th toes x Aug; 4/10 dull-throbbing apin -no injury Tx: epsom, ice bottle,. and tylenol -pt states pain stareted after water aerobics jumping up and down    68 y.o. female presents with the above complaint.   Review of Systems: Negative except as noted in the HPI. Denies N/V/F/Ch.  Past Medical History:  Diagnosis Date  . Arthritis   . Complication of anesthesia   . Coronary artery disease   . Cystic breast   . Heart murmur    " nothing to be concerned about"  . Hyperlipidemia   . Hypertension   . Norovirus   . Plantar fasciitis 2006   right  . PONV (postoperative nausea and vomiting)   . Puncture wound    right leg  . Shortness of breath dyspnea    with exertion  . Tachycardia     Current Outpatient Medications:  .  acetaminophen (TYLENOL) 650 MG CR tablet, Take 650 mg by mouth at bedtime as needed for pain., Disp: , Rfl:  .  apixaban (ELIQUIS) 5 MG TABS tablet, Take 1 tablet (5 mg total) by mouth 2 (two) times daily., Disp: 180 tablet, Rfl: 2 .  atorvastatin (LIPITOR) 10 MG tablet, Take 1 tablet (10 mg total) by mouth daily., Disp: 90 tablet, Rfl: 2 .  Calcium Carbonate-Vitamin D (CALCIUM-D PO), Take 3 tablets by mouth every evening., Disp: , Rfl:  .  cyclobenzaprine (FLEXERIL) 5 MG tablet, Take 5 mg by mouth daily as needed for muscle spasms., Disp: , Rfl:  .  flecainide (TAMBOCOR) 50 MG tablet, Take 1 tablet (50 mg total) by mouth 2 (two) times daily., Disp: 180 tablet, Rfl: 2 .  Menthol (BIOFREEZE) 10 % AERO, Apply 1 application topically daily as needed (Muscle pain)., Disp: , Rfl:  .  metoprolol succinate (TOPROL-XL) 25 MG 24 hr tablet, Take 1 tablet (25 mg total) by mouth at bedtime., Disp: 90 tablet, Rfl: 2 .  telmisartan-hydrochlorothiazide (MICARDIS HCT) 40-12.5 MG tablet, Take  1 tablet by mouth every morning. , Disp: , Rfl: 3  Social History   Tobacco Use  Smoking Status Never Smoker  Smokeless Tobacco Never Used    Allergies  Allergen Reactions  . Nsaids Diarrhea    ANTI-INFLAMMATORIES   Objective:  There were no vitals filed for this visit. Body mass index is 30.82 kg/m. Constitutional Well developed. Well nourished.  Vascular Dorsalis pedis pulses palpable bilaterally. Posterior tibial pulses palpable bilaterally. Capillary refill normal to all digits.  No cyanosis or clubbing noted. Pedal hair growth normal.  Neurologic Normal speech. Oriented to person, place, and time. Epicritic sensation to light touch grossly present bilaterally.  Dermatologic Nails well groomed and normal in appearance. No open wounds. No skin lesions.  Orthopedic: POP L 2nd/3rd IS with mulder's click. Pain dorsal and plantar   Radiographs: Taken and reviewed decreased 2nd IMA no sullivan sign. Assessment:   1. Morton neuroma, left   2. Metatarsalgia, left foot   3. Capsulitis of metatarsophalangeal (MTP) joint of left foot    Plan:  Patient was evaluated and treated and all questions answered.  Interdigital Neuroma, left -Educated on etiology -Interspace injection delivered as below. -Educated on padding and proper shoegear  Procedure: Neuroma Injection Location: Left 2nd interspace Skin Prep: Alcohol. Injectate: 0.5 cc 0.5%  marcaine plain, 0.5 cc dexamethasone phosphate. Disposition: Patient tolerated procedure well. Injection site dressed with a band-aid.   -  Return in about 3 weeks (around 02/05/2019) for Neuroma, Left.

## 2019-01-16 DIAGNOSIS — M9903 Segmental and somatic dysfunction of lumbar region: Secondary | ICD-10-CM | POA: Diagnosis not present

## 2019-01-16 DIAGNOSIS — M5442 Lumbago with sciatica, left side: Secondary | ICD-10-CM | POA: Diagnosis not present

## 2019-01-16 DIAGNOSIS — M9902 Segmental and somatic dysfunction of thoracic region: Secondary | ICD-10-CM | POA: Diagnosis not present

## 2019-01-16 DIAGNOSIS — M546 Pain in thoracic spine: Secondary | ICD-10-CM | POA: Diagnosis not present

## 2019-01-16 DIAGNOSIS — M5136 Other intervertebral disc degeneration, lumbar region: Secondary | ICD-10-CM | POA: Diagnosis not present

## 2019-01-17 ENCOUNTER — Other Ambulatory Visit: Payer: Self-pay | Admitting: Podiatry

## 2019-01-17 DIAGNOSIS — G5762 Lesion of plantar nerve, left lower limb: Secondary | ICD-10-CM

## 2019-01-17 DIAGNOSIS — M7752 Other enthesopathy of left foot: Secondary | ICD-10-CM

## 2019-01-17 DIAGNOSIS — M7742 Metatarsalgia, left foot: Secondary | ICD-10-CM

## 2019-01-22 DIAGNOSIS — J029 Acute pharyngitis, unspecified: Secondary | ICD-10-CM | POA: Diagnosis not present

## 2019-02-05 DIAGNOSIS — J3489 Other specified disorders of nose and nasal sinuses: Secondary | ICD-10-CM | POA: Diagnosis not present

## 2019-02-05 DIAGNOSIS — Z8709 Personal history of other diseases of the respiratory system: Secondary | ICD-10-CM | POA: Diagnosis not present

## 2019-02-05 DIAGNOSIS — J358 Other chronic diseases of tonsils and adenoids: Secondary | ICD-10-CM | POA: Diagnosis not present

## 2019-02-05 DIAGNOSIS — Z8669 Personal history of other diseases of the nervous system and sense organs: Secondary | ICD-10-CM | POA: Diagnosis not present

## 2019-02-06 ENCOUNTER — Ambulatory Visit (INDEPENDENT_AMBULATORY_CARE_PROVIDER_SITE_OTHER): Payer: Medicare Other | Admitting: Podiatry

## 2019-02-06 ENCOUNTER — Other Ambulatory Visit: Payer: Self-pay

## 2019-02-06 DIAGNOSIS — G5762 Lesion of plantar nerve, left lower limb: Secondary | ICD-10-CM | POA: Diagnosis not present

## 2019-02-06 DIAGNOSIS — M7742 Metatarsalgia, left foot: Secondary | ICD-10-CM

## 2019-02-06 NOTE — Progress Notes (Signed)
Subjective:  Patient ID: Ashley Mcdonald, female    DOB: [birth year],  MRN: [id number]  Chief Complaint  Patient presents with  . Neuroma    F/U L neuroma Pt. states," it feel sbetter (90%), but Sunday it was a little sore after wearing a diff. type of shoe; 2/10." tx: padding and OTC insert    68 y.o. female presents with the above complaint.   Review of Systems: Negative except as noted in the HPI. Denies N/V/F/Ch.  Past Medical History:  Diagnosis Date  . Arthritis   . Complication of anesthesia   . Coronary artery disease   . Cystic breast   . Heart murmur    " nothing to be concerned about"  . Hyperlipidemia   . Hypertension   . Norovirus   . Plantar fasciitis 2006   right  . PONV (postoperative nausea and vomiting)   . Puncture wound    right leg  . Shortness of breath dyspnea    with exertion  . Tachycardia     Current Outpatient Medications:  .  acetaminophen (TYLENOL) 650 MG CR tablet, Take 650 mg by mouth at bedtime as needed for pain., Disp: , Rfl:  .  amoxicillin-clavulanate (AUGMENTIN) 875-125 MG tablet, TAKE 1 TABLET BY MOUTH TWICE A DAY FOR 7 DAYS, Disp: , Rfl:  .  apixaban (ELIQUIS) 5 MG TABS tablet, Take 1 tablet (5 mg total) by mouth 2 (two) times daily., Disp: 180 tablet, Rfl: 2 .  atorvastatin (LIPITOR) 10 MG tablet, Take 1 tablet (10 mg total) by mouth daily., Disp: 90 tablet, Rfl: 2 .  Calcium Carbonate-Vitamin D (CALCIUM-D PO), Take 3 tablets by mouth every evening., Disp: , Rfl:  .  cyclobenzaprine (FLEXERIL) 5 MG tablet, Take 5 mg by mouth daily as needed for muscle spasms., Disp: , Rfl:  .  flecainide (TAMBOCOR) 50 MG tablet, Take 1 tablet (50 mg total) by mouth 2 (two) times daily., Disp: 180 tablet, Rfl: 2 .  Menthol (BIOFREEZE) 10 % AERO, Apply 1 application topically daily as needed (Muscle pain)., Disp: , Rfl:  .  metoprolol succinate (TOPROL-XL) 25 MG 24 hr tablet, Take 1 tablet (25 mg total) by mouth at bedtime., Disp: 90 tablet, Rfl: 2 .   telmisartan-hydrochlorothiazide (MICARDIS HCT) 40-12.5 MG tablet, Take 1 tablet by mouth every morning. , Disp: , Rfl: 3  Social History   Tobacco Use  Smoking Status Never Smoker  Smokeless Tobacco Never Used    Allergies  Allergen Reactions  . Nsaids Diarrhea    ANTI-INFLAMMATORIES   Objective:  There were no vitals filed for this visit. There is no height or weight on file to calculate BMI. Constitutional Well developed. Well nourished.  Vascular Dorsalis pedis pulses palpable bilaterally. Posterior tibial pulses palpable bilaterally. Capillary refill normal to all digits.  No cyanosis or clubbing noted. Pedal hair growth normal.  Neurologic Normal speech. Oriented to person, place, and time. Epicritic sensation to light touch grossly present bilaterally.  Dermatologic Nails well groomed and normal in appearance. No open wounds. No skin lesions.  Orthopedic: POP L 2nd/3rd IS with mulder's click. Pain dorsal and plantar   Assessment:   1. Morton neuroma, left   2. Metatarsalgia, left foot    Plan:  Patient was evaluated and treated and all questions answered.  Interdigital Neuroma, left -Injection #2 as below -Continue OTC orthotics  Procedure: Neuroma Injection Location: Left 3rd interspace Skin Prep: Alcohol. Injectate: 0.5 cc 0.5% marcaine plain, 0.5 cc dexamethasone phosphate. Disposition:  Patient tolerated procedure well. Injection site dressed with a band-aid.  Return if symptoms worsen or fail to improve.

## 2019-02-22 DIAGNOSIS — M25552 Pain in left hip: Secondary | ICD-10-CM | POA: Diagnosis not present

## 2019-02-22 DIAGNOSIS — Z09 Encounter for follow-up examination after completed treatment for conditions other than malignant neoplasm: Secondary | ICD-10-CM | POA: Diagnosis not present

## 2019-02-22 DIAGNOSIS — Z96642 Presence of left artificial hip joint: Secondary | ICD-10-CM | POA: Diagnosis not present

## 2019-05-04 ENCOUNTER — Telehealth: Payer: Self-pay | Admitting: *Deleted

## 2019-05-04 MED ORDER — APIXABAN 5 MG PO TABS: 5.0000 mg | ORAL_TABLET | Freq: Two times a day (BID) | ORAL | 2 refills | Status: DC

## 2019-05-04 NOTE — Telephone Encounter (Signed)
Rx refill sent to pharmacy.  *STAT* If patient is at the pharmacy, call can be transferred to refill team.   1. Which medications need to be refilled? (please list name of each medication and dose if known) Eliquis 5 mg bid  2. Which pharmacy/location (including street and city if local pharmacy) is medication to be sent to?[facility]  3. Do they need a 30 day or 90 day supply? [place]

## 2019-05-22 DIAGNOSIS — L918 Other hypertrophic disorders of the skin: Secondary | ICD-10-CM | POA: Diagnosis not present

## 2019-05-22 DIAGNOSIS — L719 Rosacea, unspecified: Secondary | ICD-10-CM | POA: Diagnosis not present

## 2019-05-22 DIAGNOSIS — L821 Other seborrheic keratosis: Secondary | ICD-10-CM | POA: Diagnosis not present

## 2019-05-22 DIAGNOSIS — L820 Inflamed seborrheic keratosis: Secondary | ICD-10-CM | POA: Diagnosis not present

## 2019-06-18 ENCOUNTER — Other Ambulatory Visit: Payer: Self-pay

## 2019-06-19 ENCOUNTER — Ambulatory Visit (INDEPENDENT_AMBULATORY_CARE_PROVIDER_SITE_OTHER): Payer: Medicare Other | Admitting: Certified Nurse Midwife

## 2019-06-19 ENCOUNTER — Telehealth: Payer: Self-pay | Admitting: Certified Nurse Midwife

## 2019-06-19 ENCOUNTER — Encounter: Payer: Self-pay | Admitting: Certified Nurse Midwife

## 2019-06-19 ENCOUNTER — Other Ambulatory Visit: Payer: Self-pay

## 2019-06-19 VITALS — BP 118/80 | HR 70 | Temp 97.4°F | Resp 16 | Wt 175.0 lb

## 2019-06-19 DIAGNOSIS — N3000 Acute cystitis without hematuria: Secondary | ICD-10-CM

## 2019-06-19 DIAGNOSIS — I2510 Atherosclerotic heart disease of native coronary artery without angina pectoris: Secondary | ICD-10-CM

## 2019-06-19 MED ORDER — NITROFURANTOIN MONOHYD MACRO 100 MG PO CAPS: 100.0000 mg | ORAL_CAPSULE | Freq: Two times a day (BID) | ORAL | 0 refills | Status: DC

## 2019-06-19 NOTE — Addendum Note (Signed)
Addended by: Regina Eck on: 06/19/2019 01:19 PM   Modules accepted: Orders

## 2019-06-19 NOTE — Progress Notes (Signed)
68 y.o. Married Caucasian female [id number] here with complaint of UTI, with onset  on 2 days. Patient complaining of urinary frequency/urgency/ and slight pain with urination. Patient denies fever, nausea. Slight nausea and back pain. No new personal products.  Patient feels not related to sexual activity. Denies any vaginal symptoms. . Menopausal with vaginal dryness, using moisturizer prn.Patient is trying to drink adequate water intake. Spouse recently had aortic valve replacement, so stress level has been high and also deaths in the family. Coping with all has been stressful. Has friends and family support. No other health issues today.  Review of Systems  Constitutional: Positive for malaise/fatigue.  HENT: Negative.   Eyes: Negative.   Respiratory: Negative.   Cardiovascular: Negative.   Gastrointestinal: Negative.   Genitourinary: Positive for dysuria and frequency.       Pressure, back pain  Musculoskeletal: Negative.   Skin: Negative.   Neurological: Negative.   Endo/Heme/Allergies: Negative.   Psychiatric/Behavioral: Negative.     O: Healthy female WDWN Affect: Normal, orientation x 3 Skin : warm and dry CVAT: negative bilateral Abdomen: soft, positive for suprapubic tenderness  Pelvic exam: External genital area: normal, no lesions Bladder,Urethra tender, Urethral meatus: tender, slightly red Vagina: scant vaginal discharge, atrophic appearance  Cervix: normal, non tender Uterus:normal,non tender Adnexa: normal non tender, no fullness or masses  poct urine-unable to dip due to azo A: UTI Normal pelvic exam  P: Reviewed findings of UTI and need for treatment. EP:PIRJJOAC  100 mg bid x 7 ZYS:AYTKZ micro, culture Reviewed warning signs and symptoms of UTI and need to advise if occurring. Encouraged to limit soda, tea, and coffee and be sure to increase water intake. Stressed moisture for vaginal area and around urinary meatus to help prevent UTI occurrence. Questions  addressed.   RV prn

## 2019-06-20 NOTE — Telephone Encounter (Signed)
Opened in error

## 2019-06-21 LAB — URINE CULTURE

## 2019-07-12 DIAGNOSIS — M9902 Segmental and somatic dysfunction of thoracic region: Secondary | ICD-10-CM | POA: Diagnosis not present

## 2019-07-12 DIAGNOSIS — M5136 Other intervertebral disc degeneration, lumbar region: Secondary | ICD-10-CM | POA: Diagnosis not present

## 2019-07-12 DIAGNOSIS — M5442 Lumbago with sciatica, left side: Secondary | ICD-10-CM | POA: Diagnosis not present

## 2019-07-12 DIAGNOSIS — M9903 Segmental and somatic dysfunction of lumbar region: Secondary | ICD-10-CM | POA: Diagnosis not present

## 2019-07-12 DIAGNOSIS — M546 Pain in thoracic spine: Secondary | ICD-10-CM | POA: Diagnosis not present

## 2019-08-06 DIAGNOSIS — M5442 Lumbago with sciatica, left side: Secondary | ICD-10-CM | POA: Diagnosis not present

## 2019-08-06 DIAGNOSIS — M9902 Segmental and somatic dysfunction of thoracic region: Secondary | ICD-10-CM | POA: Diagnosis not present

## 2019-08-06 DIAGNOSIS — M546 Pain in thoracic spine: Secondary | ICD-10-CM | POA: Diagnosis not present

## 2019-08-06 DIAGNOSIS — M5136 Other intervertebral disc degeneration, lumbar region: Secondary | ICD-10-CM | POA: Diagnosis not present

## 2019-08-06 DIAGNOSIS — M9903 Segmental and somatic dysfunction of lumbar region: Secondary | ICD-10-CM | POA: Diagnosis not present

## 2019-08-09 DIAGNOSIS — N3001 Acute cystitis with hematuria: Secondary | ICD-10-CM | POA: Diagnosis not present

## 2019-08-09 DIAGNOSIS — N3090 Cystitis, unspecified without hematuria: Secondary | ICD-10-CM | POA: Diagnosis not present

## 2019-08-17 DIAGNOSIS — E782 Mixed hyperlipidemia: Secondary | ICD-10-CM | POA: Diagnosis not present

## 2019-08-17 DIAGNOSIS — R300 Dysuria: Secondary | ICD-10-CM | POA: Diagnosis not present

## 2019-08-17 DIAGNOSIS — G4700 Insomnia, unspecified: Secondary | ICD-10-CM | POA: Diagnosis not present

## 2019-08-17 DIAGNOSIS — Z79899 Other long term (current) drug therapy: Secondary | ICD-10-CM | POA: Diagnosis not present

## 2019-08-17 DIAGNOSIS — Z23 Encounter for immunization: Secondary | ICD-10-CM | POA: Diagnosis not present

## 2019-08-17 DIAGNOSIS — I10 Essential (primary) hypertension: Secondary | ICD-10-CM | POA: Diagnosis not present

## 2019-08-27 DIAGNOSIS — R350 Frequency of micturition: Secondary | ICD-10-CM | POA: Diagnosis not present

## 2019-08-29 ENCOUNTER — Encounter: Payer: Self-pay | Admitting: Certified Nurse Midwife

## 2019-08-29 DIAGNOSIS — Z1231 Encounter for screening mammogram for malignant neoplasm of breast: Secondary | ICD-10-CM | POA: Diagnosis not present

## 2019-08-29 DIAGNOSIS — Z96642 Presence of left artificial hip joint: Secondary | ICD-10-CM | POA: Diagnosis not present

## 2019-08-29 DIAGNOSIS — M85851 Other specified disorders of bone density and structure, right thigh: Secondary | ICD-10-CM | POA: Diagnosis not present

## 2019-09-04 ENCOUNTER — Telehealth: Payer: Self-pay

## 2019-09-04 NOTE — Telephone Encounter (Signed)
Left message for call back.

## 2019-09-05 NOTE — Telephone Encounter (Signed)
Patient is returning call to Joy. °

## 2019-09-05 NOTE — Telephone Encounter (Signed)
Patient notified of bone density results showing osteopenia in rt femur neck only. Spine is normal. See scanned in report

## 2019-09-06 DIAGNOSIS — M546 Pain in thoracic spine: Secondary | ICD-10-CM | POA: Diagnosis not present

## 2019-09-06 DIAGNOSIS — M9902 Segmental and somatic dysfunction of thoracic region: Secondary | ICD-10-CM | POA: Diagnosis not present

## 2019-09-06 DIAGNOSIS — M9903 Segmental and somatic dysfunction of lumbar region: Secondary | ICD-10-CM | POA: Diagnosis not present

## 2019-09-06 DIAGNOSIS — M5136 Other intervertebral disc degeneration, lumbar region: Secondary | ICD-10-CM | POA: Diagnosis not present

## 2019-09-06 DIAGNOSIS — M5442 Lumbago with sciatica, left side: Secondary | ICD-10-CM | POA: Diagnosis not present

## 2019-09-17 ENCOUNTER — Encounter: Payer: Self-pay | Admitting: Cardiology

## 2019-09-17 ENCOUNTER — Ambulatory Visit (INDEPENDENT_AMBULATORY_CARE_PROVIDER_SITE_OTHER): Payer: Medicare Other | Admitting: Cardiology

## 2019-09-17 ENCOUNTER — Other Ambulatory Visit: Payer: Self-pay

## 2019-09-17 VITALS — BP 114/78 | HR 62 | Ht 62.5 in | Wt 176.8 lb

## 2019-09-17 DIAGNOSIS — I480 Paroxysmal atrial fibrillation: Secondary | ICD-10-CM | POA: Diagnosis not present

## 2019-09-17 DIAGNOSIS — I2510 Atherosclerotic heart disease of native coronary artery without angina pectoris: Secondary | ICD-10-CM | POA: Diagnosis not present

## 2019-09-17 DIAGNOSIS — I10 Essential (primary) hypertension: Secondary | ICD-10-CM | POA: Diagnosis not present

## 2019-09-17 NOTE — Patient Instructions (Signed)
Medication Instructions:  Your physician recommends that you continue on your current medications as directed. Please refer to the Current Medication list given to you today.  If you need a refill on your cardiac medications before your next appointment, please call your pharmacy.   Lab work: NONE If you have labs (blood work) drawn today and your tests are completely normal, you will receive your results only by: Marland Kitchen MyChart Message (if you have MyChart) OR . A paper copy in the mail If you have any lab test that is abnormal or we need to change your treatment, we will call you to review the results.  Testing/Procedures: You had an EKG Performed today  Follow-Up: At [facility], you and your health needs are our priority.  As part of our continuing mission to provide you with exceptional heart care, we have created designated Provider Care Teams.  These Care Teams include your primary Cardiologist (physician) and Advanced Practice Providers (APPs -  Physician Assistants and Nurse Practitioners) who all work together to provide you with the care you need, when you need it. You will need a follow up appointment in 6 months.

## 2019-09-17 NOTE — Progress Notes (Signed)
Cardiology Office Note:    Date:  09/17/2019   ID:  [name], DOB [birth year], MRN [id number]  PCP:  Myer Peer, MD  Cardiologist:  Jenean Lindau, MD   Referring MD: Myer Peer, MD    ASSESSMENT:    1. PAF (paroxysmal atrial fibrillation) ([place])   2. Essential hypertension    PLAN:    In order of problems listed above:  1. Coronary artery disease: Secondary prevention stressed with the patient.  Importance of compliance with diet and medication stressed and she vocalized understanding.  I had some of her recent lipids and they are fine.  Triglycerides were elevated and diet was discussed and she vocalized understanding.  Weight reduction was stressed.  Risks of obesity explained.  She promises to do better.  She has been like regular exercise and 30-minute walking 5 days a week at least was stressed and she promises to do so. 2. Essential hypertension: Blood pressure stable 3. Paroxysmal atrial fibrillation:I discussed with the patient atrial fibrillation, disease process. Management and therapy including rate and rhythm control, anticoagulation benefits and potential risks were discussed extensively with the patient. Patient had multiple questions which were answered to patient's satisfaction. 4. Patient will be seen in follow-up appointment in 6 months or earlier if the patient has any concerns    Medication Adjustments/Labs and Tests Ordered: Current medicines are reviewed at length with the patient today.  Concerns regarding medicines are outlined above.  No orders of the defined types were placed in this encounter.  No orders of the defined types were placed in this encounter.    No chief complaint on file.    History of Present Illness:    Ashley Mcdonald is a 68 y.o. female.  Patient has past medical history approximately fibrillation and coronary artery disease.  She denies any problems at this time and takes care of activities of daily living.  No chest  pain orthopnea or PND.  At the time of my evaluation, the patient is alert awake oriented and in no distress.   Past Medical History:  Diagnosis Date  . Arthritis   . Complication of anesthesia   . Coronary artery disease   . Cystic breast   . Heart murmur    " nothing to be concerned about"  . Hyperlipidemia   . Hypertension   . Norovirus   . Plantar fasciitis 2006   right  . PONV (postoperative nausea and vomiting)   . Puncture wound    right leg  . Shortness of breath dyspnea    with exertion  . Tachycardia     Past Surgical History:  Procedure Laterality Date  . COLONOSCOPY    . HYSTEROSCOPY     D&C polypectomy  . LEFT HEART CATH AND CORONARY ANGIOGRAPHY N/A 08/23/2017   Procedure: LEFT HEART CATH AND CORONARY ANGIOGRAPHY;  Surgeon: Belva Crome, MD;  Location: [facility];  Service: Cardiovascular;  Laterality: N/A;  . SPINAL FUSION  2008   c5-7  . TOTAL HIP ARTHROPLASTY Left 03/22/2016   Procedure: TOTAL HIP ARTHROPLASTY ANTERIOR APPROACH;  Surgeon: Frederik Pear, MD;  Location: [facility];  Service: Orthopedics;  Laterality: Left;    Current Medications: Current Meds  Medication Sig  . acetaminophen (TYLENOL) 650 MG CR tablet Take 650 mg by mouth at bedtime as needed for pain.  Marland Kitchen apixaban (ELIQUIS) 5 MG TABS tablet Take 1 tablet (5 mg total) by mouth 2 (two) times daily.  Marland Kitchen  atorvastatin (LIPITOR) 10 MG tablet Take 1 tablet (10 mg total) by mouth daily.  . Calcium Carbonate-Vitamin D (CALCIUM-D PO) Take 3 tablets by mouth every evening.  . cyclobenzaprine (FLEXERIL) 5 MG tablet Take 5 mg by mouth daily as needed for muscle spasms.  . flecainide (TAMBOCOR) 50 MG tablet Take 1 tablet (50 mg total) by mouth 2 (two) times daily.  . Menthol (BIOFREEZE) 10 % AERO Apply 1 application topically daily as needed (Muscle pain).  . metoprolol succinate (TOPROL-XL) 50 MG 24 hr tablet Take 1 tablet by mouth daily.  Marland Kitchen telmisartan-hydrochlorothiazide (MICARDIS HCT) 40-12.5 MG  tablet Take 1 tablet by mouth every morning.      Allergies:   Nsaids   Social History   Socioeconomic History  . Marital status: Married    Spouse name: Not on file  . Number of children: Not on file  . Years of education: Not on file  . Highest education level: Not on file  Occupational History  . Not on file  Social Needs  . Financial resource strain: Not on file  . Food insecurity    Worry: Not on file    Inability: Not on file  . Transportation needs    Medical: Not on file    Non-medical: Not on file  Tobacco Use  . Smoking status: Never Smoker  . Smokeless tobacco: Never Used  Substance and Sexual Activity  . Alcohol use: No    Alcohol/week: 0.0 standard drinks  . Drug use: No  . Sexual activity: Not Currently    Partners: Male    Birth control/protection: Post-menopausal  Lifestyle  . Physical activity    Days per week: Not on file    Minutes per session: Not on file  . Stress: Not on file  Relationships  . Social Herbalist on phone: Not on file    Gets together: Not on file    Attends religious service: Not on file    Active member of club or organization: Not on file    Attends meetings of clubs or organizations: Not on file    Relationship status: Not on file  Other Topics Concern  . Not on file  Social History Narrative  . Not on file     Family History: The patient's family history includes Cancer in her maternal grandmother; Diabetes in her paternal grandmother; Heart attack in her father; Stroke in her mother.  ROS:   Please see the history of present illness.    All other systems reviewed and are negative.  EKGs/Labs/Other Studies Reviewed:    The following studies were reviewed today: LEFT HEART CATH AND CORONARY ANGIOGRAPHY  Conclusion   Widely patent coronary arteries  30-40% proximal LAD eccentric plaque. Diffuse luminal irregularities in the mid to distal LAD.  Normal left ventricular function and hemodynamics.       Recent Labs: 11/02/2018: ALT 18; BUN 11; Creatinine, Ser 0.90; Potassium 4.8; Sodium 140  Recent Lipid Panel    Component Value Date/Time   CHOL 138 11/02/2018 1011   TRIG 92 11/02/2018 1011   HDL 41 11/02/2018 1011   CHOLHDL 3.4 11/02/2018 1011   LDLCALC 79 11/02/2018 1011    Physical Exam:    VS:  BP 114/78 (BP Location: Right Arm, Patient Position: Sitting, Cuff Size: Normal)   Pulse 62   Ht 5' 2.5" (1.588 m)   Wt 176 lb 12.8 oz (80.2 kg)   LMP 09/12/2002   SpO2 94%  BMI 31.82 kg/m     Wt Readings from Last 3 Encounters:  09/17/19 176 lb 12.8 oz (80.2 kg)  06/19/19 175 lb (79.4 kg)  01/15/19 174 lb (78.9 kg)     GEN: Patient is in no acute distress HEENT: Normal NECK: No JVD; No carotid bruits LYMPHATICS: No lymphadenopathy CARDIAC: Hear sounds regular, 2/6 systolic murmur at the apex. RESPIRATORY:  Clear to auscultation without rales, wheezing or rhonchi  ABDOMEN: Soft, non-tender, non-distended MUSCULOSKELETAL:  No edema; No deformity  SKIN: Warm and dry NEUROLOGIC:  Alert and oriented x 3 PSYCHIATRIC:  Normal affect   Signed, Jenean Lindau, MD  09/17/2019 10:57 AM    [place]

## 2019-09-18 ENCOUNTER — Other Ambulatory Visit: Payer: Self-pay | Admitting: Cardiology

## 2019-09-27 DIAGNOSIS — M5136 Other intervertebral disc degeneration, lumbar region: Secondary | ICD-10-CM | POA: Diagnosis not present

## 2019-09-27 DIAGNOSIS — M9903 Segmental and somatic dysfunction of lumbar region: Secondary | ICD-10-CM | POA: Diagnosis not present

## 2019-09-27 DIAGNOSIS — M9902 Segmental and somatic dysfunction of thoracic region: Secondary | ICD-10-CM | POA: Diagnosis not present

## 2019-09-27 DIAGNOSIS — M546 Pain in thoracic spine: Secondary | ICD-10-CM | POA: Diagnosis not present

## 2019-09-27 DIAGNOSIS — M5442 Lumbago with sciatica, left side: Secondary | ICD-10-CM | POA: Diagnosis not present

## 2019-10-02 DIAGNOSIS — M9903 Segmental and somatic dysfunction of lumbar region: Secondary | ICD-10-CM | POA: Diagnosis not present

## 2019-10-02 DIAGNOSIS — M9902 Segmental and somatic dysfunction of thoracic region: Secondary | ICD-10-CM | POA: Diagnosis not present

## 2019-10-02 DIAGNOSIS — M546 Pain in thoracic spine: Secondary | ICD-10-CM | POA: Diagnosis not present

## 2019-10-02 DIAGNOSIS — M5442 Lumbago with sciatica, left side: Secondary | ICD-10-CM | POA: Diagnosis not present

## 2019-10-02 DIAGNOSIS — M5136 Other intervertebral disc degeneration, lumbar region: Secondary | ICD-10-CM | POA: Diagnosis not present

## 2019-10-04 ENCOUNTER — Other Ambulatory Visit: Payer: Self-pay | Admitting: Cardiology

## 2019-11-05 DIAGNOSIS — M9902 Segmental and somatic dysfunction of thoracic region: Secondary | ICD-10-CM | POA: Diagnosis not present

## 2019-11-05 DIAGNOSIS — M5442 Lumbago with sciatica, left side: Secondary | ICD-10-CM | POA: Diagnosis not present

## 2019-11-05 DIAGNOSIS — M546 Pain in thoracic spine: Secondary | ICD-10-CM | POA: Diagnosis not present

## 2019-11-05 DIAGNOSIS — M9903 Segmental and somatic dysfunction of lumbar region: Secondary | ICD-10-CM | POA: Diagnosis not present

## 2019-11-05 DIAGNOSIS — M5136 Other intervertebral disc degeneration, lumbar region: Secondary | ICD-10-CM | POA: Diagnosis not present

## 2019-11-26 DIAGNOSIS — L820 Inflamed seborrheic keratosis: Secondary | ICD-10-CM | POA: Diagnosis not present

## 2020-01-24 ENCOUNTER — Other Ambulatory Visit: Payer: Self-pay | Admitting: Cardiology

## 2020-03-04 ENCOUNTER — Encounter: Payer: Self-pay | Admitting: Certified Nurse Midwife

## 2020-03-18 DIAGNOSIS — K625 Hemorrhage of anus and rectum: Secondary | ICD-10-CM | POA: Diagnosis not present

## 2020-04-08 DIAGNOSIS — H524 Presbyopia: Secondary | ICD-10-CM | POA: Diagnosis not present

## 2020-04-08 DIAGNOSIS — H2513 Age-related nuclear cataract, bilateral: Secondary | ICD-10-CM | POA: Diagnosis not present

## 2020-04-09 ENCOUNTER — Other Ambulatory Visit: Payer: Self-pay

## 2020-04-09 ENCOUNTER — Ambulatory Visit (INDEPENDENT_AMBULATORY_CARE_PROVIDER_SITE_OTHER): Payer: Medicare Other

## 2020-04-09 ENCOUNTER — Encounter: Payer: Self-pay | Admitting: Cardiology

## 2020-04-09 ENCOUNTER — Ambulatory Visit (INDEPENDENT_AMBULATORY_CARE_PROVIDER_SITE_OTHER): Payer: Medicare Other | Admitting: Cardiology

## 2020-04-09 VITALS — BP 110/68 | HR 95 | Temp 96.8°F | Ht 63.0 in | Wt 179.6 lb

## 2020-04-09 DIAGNOSIS — E782 Mixed hyperlipidemia: Secondary | ICD-10-CM

## 2020-04-09 DIAGNOSIS — I10 Essential (primary) hypertension: Secondary | ICD-10-CM

## 2020-04-09 DIAGNOSIS — I480 Paroxysmal atrial fibrillation: Secondary | ICD-10-CM

## 2020-04-09 DIAGNOSIS — I2510 Atherosclerotic heart disease of native coronary artery without angina pectoris: Secondary | ICD-10-CM | POA: Diagnosis not present

## 2020-04-09 HISTORY — DX: Mixed hyperlipidemia: E78.2

## 2020-04-09 NOTE — Patient Instructions (Signed)
Medication Instructions:  No medication changes *If you need a refill on your cardiac medications before your next appointment, please call your pharmacy*   Lab Work: Your physician recommends that you return for lab work in: next few day.  You need to have labs done when you are fasting.  You can come Monday through Friday 8:30 am to 12:00 pm and 1:15 to 4:30. You do not need to make an appointment as the order has already been placed. The labs you are going to have done are BMET, CBC, TSH, LFT and Lipids.   If you have labs (blood work) drawn today and your tests are completely normal, you will receive your results only by: Marland Kitchen MyChart Message (if you have MyChart) OR . A paper copy in the mail If you have any lab test that is abnormal or we need to change your treatment, we will call you to review the results.   Testing/Procedures: Your physician has requested that you have a lexiscan myoview. For further information please visit [email or web address]. Please follow instruction sheet, as given.  The test will take approximately 3 to 4 hours to complete; you may bring reading material.  If someone comes with you to your appointment, they will need to remain in the main lobby due to limited space in the testing area. **If you are pregnant or breastfeeding, please notify the nuclear lab prior to your appointment**  How to prepare for your Myocardial Perfusion Test: . Do not eat or drink 3 hours prior to your test, except you may have water. . Do not consume products containing caffeine (regular or decaffeinated) 12 hours prior to your test. (ex: coffee, chocolate, sodas, tea). Do bring a list of your current medications with you.  If not listed below, you may take your medications as normal. . Do not take metoprolol (Lopressor, Toprol) for 24 hours prior to the test.  Bring the medication to your appointment as you may be required to take it once the test is complete. . Do wear comfortable  clothes (no dresses or overalls) and walking shoes, tennis shoes preferred (No heels or open toe shoes are allowed). . Do NOT wear cologne, perfume, aftershave, or lotions (deodorant is allowed). . If these instructions are not followed, your test will have to be rescheduled.  WHY IS MY DOCTOR PRESCRIBING ZIO? The Zio system is proven and trusted by physicians to detect and diagnose irregular heart rhythms -- and has been prescribed to hundreds of thousands of patients.  The FDA has cleared the Zio system to monitor for many different kinds of irregular heart rhythms. In a study, physicians were able to reach a diagnosis 90% of the time with the Zio system1.  You can wear the Zio monitor -- a small, discreet, comfortable patch -- during your normal day-to-day activity, including while you sleep, shower, and exercise, while it records every single heartbeat for analysis.  1Barrett, P., et al. Comparison of 24 Hour Holter Monitoring Versus 14 Day Novel Adhesive Patch Electrocardiographic Monitoring. [employer], 2014.  ZIO VS. HOLTER MONITORING The Zio monitor can be comfortably worn for up to 14 days. Holter monitors can be worn for 24 to 48 hours, limiting the time to record any irregular heart rhythms you may have. Zio is able to capture data for the 51% of patients who have their first symptom-triggered arrhythmia after 48 hours.1  LIVE WITHOUT RESTRICTIONS The Zio ambulatory cardiac monitor is a small, unobtrusive, and water-resistant patch--you might even  forget you're wearing it. The Zio monitor records and stores every beat of your heart, whether you're sleeping, working out, or showering.  Wear the monitor for 2 weeks, remove 04/23/20.   Follow-Up: At [facility], you and your health needs are our priority.  As part of our continuing mission to provide you with exceptional heart care, we have created designated Provider Care Teams.  These Care Teams include your  primary Cardiologist (physician) and Advanced Practice Providers (APPs -  Physician Assistants and Nurse Practitioners) who all work together to provide you with the care you need, when you need it.  We recommend signing up for the patient portal called "MyChart".  Sign up information is provided on this After Visit Summary.  MyChart is used to connect with patients for Virtual Visits (Telemedicine).  Patients are able to view lab/test results, encounter notes, upcoming appointments, etc.  Non-urgent messages can be sent to your provider as well.   To learn more about what you can do with MyChart, go to [email or web address].    Your next appointment:   6 month(s)  The format for your next appointment:   In Person  Provider:   Jyl Heinz, MD   Other Instructions NA

## 2020-04-09 NOTE — Addendum Note (Signed)
Addended by: Truddie Hidden on: 04/09/2020 04:12 PM   Modules accepted: Orders

## 2020-04-09 NOTE — Progress Notes (Signed)
Cardiology Office Note:    Date:  04/09/2020   ID:  Ashley Mcdonald, DOB 16-Dec-1950, MRN [id number]  PCP:  Myer Peer, MD  Cardiologist:  Jenean Lindau, MD   Referring MD: Myer Peer, MD    ASSESSMENT:    1. Coronary artery disease involving native coronary artery of native heart without angina pectoris   2. Essential hypertension   3. PAF (paroxysmal atrial fibrillation) ([place])   4. Mixed dyslipidemia    PLAN:    In order of problems listed above:  1. Secondary prevention stressed with the patient.  Importance of compliance with diet and medication stressed and she vocalized understanding.  Her effort tolerance is excellent diet was discussed to lose weight and she promises to do better. 2. Essential hypertension: Blood pressure is better 3. Mixed dyslipidemia: She will be back in the next few days for fasting liver lipid check. 4. Paroxysmal atrial fibrillation: She gives history of some abnormal irregular heartbeat-like sensation.  I will do all blood work including TSH.  I will also do a 2-week ZIO monitoring to see if she is into atrial fibrillation and to see if we need to titrate antiarrhythmic therapy.  I discussed this with him she understands.  In view of the fact that she has coronary artery disease which was mild 2 years ago and the fact she is on flecainide I will do a Lexiscan sestamibi.  Further recommendation will depend on the findings of the aforementioned test 5. Patient will be seen in follow-up appointment in 6 months or earlier if the patient has any concerns    Medication Adjustments/Labs and Tests Ordered: Current medicines are reviewed at length with the patient today.  Concerns regarding medicines are outlined above.  No orders of the defined types were placed in this encounter.  No orders of the defined types were placed in this encounter.    No chief complaint on file.    History of Present Illness:    Ashley Mcdonald is a 69 y.o.  female.  Patient has past medical history of mild coronary artery disease, essential hypertension and dyslipidemia.  She has paroxysmal atrial fibrillation.  She walks about 2 miles a day on a regular basis.  No chest pain orthopnea or PND.  At the time of my evaluation, the patient is alert awake oriented and in no distress.  Past Medical History:  Diagnosis Date  . Arthritis   . Arthritis of left hip 03/22/2016  . CAD (coronary artery disease) 09/16/2017  . 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 Complication of anesthesia   . Coronary artery disease   . Cystic breast   . Essential hypertension 08/01/2017  . Heart murmur    " nothing to be concerned about"  . Hyperlipidemia   . Hypertension   . Norovirus   . PAF (paroxysmal atrial fibrillation) ([place]) 02/14/2018  . Plantar fasciitis 2006   right  . PONV (postoperative nausea and vomiting)   . Primary osteoarthritis of first carpometacarpal joint of right hand 11/28/2015  . Primary osteoarthritis of left hip 03/20/2016  . Puncture wound    right leg  . Shortness of breath dyspnea    with exertion  . 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  . Supraventricular tachycardia ([place]) 08/01/2017  . Tachycardia   . Trigger finger of right thumb 11/28/2015    Past Surgical History:  Procedure Laterality Date  . COLONOSCOPY    . HYSTEROSCOPY     D&C polypectomy  .  LEFT HEART CATH AND CORONARY ANGIOGRAPHY N/A 08/23/2017   Procedure: LEFT HEART CATH AND CORONARY ANGIOGRAPHY;  Surgeon: Belva Crome, MD;  Location: [facility];  Service: Cardiovascular;  Laterality: N/A;  . SPINAL FUSION  2008   c5-7  . TOTAL HIP ARTHROPLASTY Left 03/22/2016   Procedure: TOTAL HIP ARTHROPLASTY ANTERIOR APPROACH;  Surgeon: Frederik Pear, MD;  Location: [facility];  Service: Orthopedics;  Laterality: Left;    Current Medications: Current Meds  Medication Sig  . acetaminophen (TYLENOL) 650 MG CR tablet Take 650 mg by mouth at bedtime as needed for pain.  Marland Kitchen  atorvastatin (LIPITOR) 10 MG tablet TAKE 1 TABLET DAILY  . Calcium Carbonate-Vitamin D (CALCIUM-D PO) Take 3 tablets by mouth every evening.  . cyclobenzaprine (FLEXERIL) 5 MG tablet Take 5 mg by mouth daily as needed for muscle spasms.  Marland Kitchen doxepin (SINEQUAN) 10 MG/ML solution Take 10 mg by mouth at bedtime.  Marland Kitchen ELIQUIS 5 MG TABS tablet TAKE 1 TABLET BY MOUTH TWICE A DAY  . flecainide (TAMBOCOR) 50 MG tablet TAKE 1 TABLET TWICE A DAY  . Menthol (BIOFREEZE) 10 % AERO Apply 1 application topically daily as needed (Muscle pain).  . metoprolol succinate (TOPROL-XL) 50 MG 24 hr tablet Take 1 tablet by mouth daily.  Marland Kitchen telmisartan-hydrochlorothiazide (MICARDIS HCT) 40-12.5 MG tablet Take 1 tablet by mouth every morning.      Allergies:   Nsaids   Social History   Socioeconomic History  . Marital status: Married    Spouse name: Not on file  . Number of children: Not on file  . Years of education: Not on file  . Highest education level: Not on file  Occupational History  . Not on file  Tobacco Use  . Smoking status: Never Smoker  . Smokeless tobacco: Never Used  Substance and Sexual Activity  . Alcohol use: No    Alcohol/week: 0.0 standard drinks  . Drug use: No  . Sexual activity: Not Currently    Partners: Male    Birth control/protection: Post-menopausal  Other Topics Concern  . Not on file  Social History Narrative  . Not on file   Social Determinants of Health   Financial Resource Strain:   . Difficulty of Paying Living Expenses:   Food Insecurity:   . Worried About Charity fundraiser in the Last Year:   . Arboriculturist in the Last Year:   Transportation Needs:   . Film/video editor (Medical):   Marland Kitchen Lack of Transportation (Non-Medical):   Physical Activity:   . Days of Exercise per Week:   . Minutes of Exercise per Session:   Stress:   . Feeling of Stress :   Social Connections:   . Frequency of Communication with Friends and Family:   . Frequency of Social  Gatherings with Friends and Family:   . Attends Religious Services:   . Active Member of Clubs or Organizations:   . Attends Archivist Meetings:   Marland Kitchen Marital Status:      Family History: The patient's family history includes Cancer in her maternal grandmother; Diabetes in her paternal grandmother; Heart attack in her father; Stroke in her mother.  ROS:   Please see the history of present illness.    All other systems reviewed and are negative.  EKGs/Labs/Other Studies Reviewed:    The following studies were reviewed today: EKG reveals sinus rhythm and nonspecific ST-T changes.  QRS is within normal limits  Recent Labs: No results found for requested labs within last 8760 hours.  Recent Lipid Panel    Component Value Date/Time   CHOL 138 11/02/2018 1011   TRIG 92 11/02/2018 1011   HDL 41 11/02/2018 1011   CHOLHDL 3.4 11/02/2018 1011   LDLCALC 79 11/02/2018 1011    Physical Exam:    VS:  BP 110/68   Pulse 95   Temp (!) 96.8 F (36 C)   Ht 5\' 3"  (1.6 m)   Wt 179 lb 9.6 oz (81.5 kg)   LMP 09/12/2002   SpO2 95%   BMI 31.81 kg/m     Wt Readings from Last 3 Encounters:  04/09/20 179 lb 9.6 oz (81.5 kg)  09/17/19 176 lb 12.8 oz (80.2 kg)  06/19/19 175 lb (79.4 kg)     GEN: Patient is in no acute distress HEENT: Normal NECK: No JVD; No carotid bruits LYMPHATICS: No lymphadenopathy CARDIAC: Hear sounds regular, 2/6 systolic murmur at the apex. RESPIRATORY:  Clear to auscultation without rales, wheezing or rhonchi  ABDOMEN: Soft, non-tender, non-distended MUSCULOSKELETAL:  No edema; No deformity  SKIN: Warm and dry NEUROLOGIC:  Alert and oriented x 3 PSYCHIATRIC:  Normal affect   Signed, Jenean Lindau, MD  04/09/2020 1:54 PM    [facility]

## 2020-04-10 ENCOUNTER — Telehealth (HOSPITAL_COMMUNITY): Payer: Self-pay | Admitting: *Deleted

## 2020-04-10 NOTE — Telephone Encounter (Signed)
Patient given detailed instructions per Myocardial Perfusion Study Information Sheet for the test on 04/17/20 Patient notified to arrive 15 minutes early and that it is imperative to arrive on time for appointment to keep from having the test rescheduled.  If you need to cancel or reschedule your appointment, please call the office within 24 hours of your appointment. . Patient verbalized understanding. Ashley Mcdonald

## 2020-04-17 ENCOUNTER — Other Ambulatory Visit: Payer: Self-pay

## 2020-04-17 ENCOUNTER — Ambulatory Visit (INDEPENDENT_AMBULATORY_CARE_PROVIDER_SITE_OTHER): Payer: Medicare Other

## 2020-04-17 VITALS — Ht 63.0 in | Wt 179.0 lb

## 2020-04-17 DIAGNOSIS — I2510 Atherosclerotic heart disease of native coronary artery without angina pectoris: Secondary | ICD-10-CM | POA: Diagnosis not present

## 2020-04-17 DIAGNOSIS — E782 Mixed hyperlipidemia: Secondary | ICD-10-CM | POA: Diagnosis not present

## 2020-04-17 DIAGNOSIS — I480 Paroxysmal atrial fibrillation: Secondary | ICD-10-CM | POA: Diagnosis not present

## 2020-04-17 DIAGNOSIS — I10 Essential (primary) hypertension: Secondary | ICD-10-CM | POA: Diagnosis not present

## 2020-04-17 LAB — MYOCARDIAL PERFUSION IMAGING
LV dias vol: 50 mL (ref 46–106)
LV sys vol: 11 mL
Peak HR: 90 {beats}/min
Rest HR: 68 {beats}/min
SDS: 6
SRS: 0
SSS: 6
TID: 1.03

## 2020-04-17 MED ORDER — REGADENOSON 0.4 MG/5ML IV SOLN
0.4000 mg | Freq: Once | INTRAVENOUS | Status: AC
  Administered 2020-04-17: 0.4 mg via INTRAVENOUS

## 2020-04-17 MED ORDER — TECHNETIUM TC 99M TETROFOSMIN IV KIT
29.5000 | PACK | Freq: Once | INTRAVENOUS | Status: AC | PRN
  Administered 2020-04-17: 29.5 via INTRAVENOUS

## 2020-04-17 MED ORDER — TECHNETIUM TC 99M TETROFOSMIN IV KIT
10.8000 | PACK | Freq: Once | INTRAVENOUS | Status: AC | PRN
  Administered 2020-04-17: 10.8 via INTRAVENOUS

## 2020-04-18 LAB — BASIC METABOLIC PANEL
BUN/Creatinine Ratio: 16 (ref 12–28)
BUN: 13 mg/dL (ref 8–27)
CO2: 26 mmol/L (ref 20–29)
Calcium: 9.7 mg/dL (ref 8.7–10.3)
Chloride: 103 mmol/L (ref 96–106)
Creatinine, Ser: 0.82 mg/dL (ref 0.57–1.00)
GFR calc Af Amer: 85 mL/min/{1.73_m2} (ref >59)
GFR calc non Af Amer: 74 mL/min/{1.73_m2} (ref >59)
Glucose: 113 mg/dL — ABNORMAL HIGH (ref 65–99)
Potassium: 4.3 mmol/L (ref 3.5–5.2)
Sodium: 141 mmol/L (ref 134–144)

## 2020-04-18 LAB — CBC WITH DIFFERENTIAL/PLATELET
Basophils Absolute: 0 10*3/uL (ref 0.0–0.2)
Basos: 1 %
EOS (ABSOLUTE): 0.1 10*3/uL (ref 0.0–0.4)
Eos: 2 %
Hematocrit: 38.9 % (ref 34.0–46.6)
Hemoglobin: 13 g/dL (ref 11.1–15.9)
Immature Grans (Abs): 0 10*3/uL (ref 0.0–0.1)
Immature Granulocytes: 0 %
Lymphocytes Absolute: 1 10*3/uL (ref 0.7–3.1)
Lymphs: 19 %
MCH: 28 pg (ref 26.6–33.0)
MCHC: 33.4 g/dL (ref 31.5–35.7)
MCV: 84 fL (ref 79–97)
Monocytes Absolute: 0.7 10*3/uL (ref 0.1–0.9)
Monocytes: 12 %
Neutrophils Absolute: 3.5 10*3/uL (ref 1.4–7.0)
Neutrophils: 66 %
Platelets: 196 10*3/uL (ref 150–450)
RBC: 4.65 x10E6/uL (ref 3.77–5.28)
RDW: 13 % (ref 11.7–15.4)
WBC: 5.3 10*3/uL (ref 3.4–10.8)

## 2020-04-18 LAB — LIPID PANEL
Chol/HDL Ratio: 3.4 ratio (ref 0.0–4.4)
Cholesterol, Total: 117 mg/dL (ref 100–199)
HDL: 34 mg/dL — ABNORMAL LOW (ref >39)
LDL Chol Calc (NIH): 65 mg/dL (ref 0–99)
Triglycerides: 91 mg/dL (ref 0–149)
VLDL Cholesterol Cal: 18 mg/dL (ref 5–40)

## 2020-04-18 LAB — HEPATIC FUNCTION PANEL
ALT: 19 IU/L (ref 0–32)
AST: 22 IU/L (ref 0–40)
Albumin: 4.5 g/dL (ref 3.8–4.8)
Alkaline Phosphatase: 97 IU/L (ref 39–117)
Bilirubin Total: 0.6 mg/dL (ref 0.0–1.2)
Bilirubin, Direct: 0.18 mg/dL (ref 0.00–0.40)
Total Protein: 6.6 g/dL (ref 6.0–8.5)

## 2020-04-18 LAB — TSH: TSH: 3.84 u[IU]/mL (ref 0.450–4.500)

## 2020-05-07 ENCOUNTER — Other Ambulatory Visit: Payer: Self-pay | Admitting: Gastroenterology

## 2020-05-07 DIAGNOSIS — R195 Other fecal abnormalities: Secondary | ICD-10-CM

## 2020-05-07 DIAGNOSIS — R1032 Left lower quadrant pain: Secondary | ICD-10-CM

## 2020-05-07 DIAGNOSIS — I4891 Unspecified atrial fibrillation: Secondary | ICD-10-CM | POA: Diagnosis not present

## 2020-05-07 DIAGNOSIS — K625 Hemorrhage of anus and rectum: Secondary | ICD-10-CM

## 2020-05-13 ENCOUNTER — Other Ambulatory Visit: Payer: Self-pay

## 2020-05-13 ENCOUNTER — Ambulatory Visit
Admission: RE | Admit: 2020-05-13 | Discharge: 2020-05-13 | Disposition: A | Discharge status: Home / Self Care | Payer: Medicare Other | Source: Ambulatory Visit | Attending: Gastroenterology | Admitting: Gastroenterology

## 2020-05-13 DIAGNOSIS — K6389 Other specified diseases of intestine: Secondary | ICD-10-CM | POA: Diagnosis not present

## 2020-05-13 DIAGNOSIS — K625 Hemorrhage of anus and rectum: Secondary | ICD-10-CM

## 2020-05-13 DIAGNOSIS — R1032 Left lower quadrant pain: Secondary | ICD-10-CM

## 2020-05-13 DIAGNOSIS — R195 Other fecal abnormalities: Secondary | ICD-10-CM

## 2020-05-13 MED ORDER — IOPAMIDOL (ISOVUE-300) INJECTION 61%
100.0000 mL | Freq: Once | INTRAVENOUS | Status: AC | PRN
  Administered 2020-05-13: 100 mL via INTRAVENOUS

## 2020-05-22 ENCOUNTER — Telehealth: Payer: Self-pay

## 2020-05-22 NOTE — Telephone Encounter (Signed)
   [facility] Pre-operative Risk Assessment    HEARTCARE STAFF: - Please ensure there is not already an duplicate clearance open for this procedure. - Under Visit Info/Reason for Call, type in Other and utilize the format Clearance MM/DD/YY or Clearance TBD. Do not use dashes or single digits. - If request is for dental extraction, please clarify the # of teeth to be extracted.  Request for surgical clearance:  1. What type of surgery is being performed? Colonoscopy   2. When is this surgery scheduled? 06-18-20   3. What type of clearance is required (medical clearance vs. Pharmacy clearance to hold med vs. Both)? Pharmacy  4. Are there any medications that need to be held prior to surgery and how long?Eliquis 5 day hold   5. Practice name and name of physician performing surgery?  [facility]- Dr. Therisa Doyne   6. What is the office phone number? [phone]   7.   What is the office fax number? [phone]  8.   Anesthesia type (None, local, MAC, general) ? Propofol   Lowella Grip 05/22/2020, 4:37 PM  _________________________________________________________________   (provider comments below)

## 2020-05-23 NOTE — Telephone Encounter (Signed)
Patient with diagnosis of A Fib on Eliquis for anticoagulation.    Procedure: colonoscopy Date of procedure: 06/18/20  CHADS2-VASc score of  4 (HTN, AGE, CAD,female)  CrCl 66 mL/min using adjusted body weight   Per office protocol, patient can hold Eliquis for 1-2 days prior to procedure.

## 2020-05-23 NOTE — Telephone Encounter (Signed)
   Primary Cardiologist: Jenean Lindau, MD  Chart reviewed as part of pre-operative protocol coverage. Given past medical history and time since last visit, based on ACC/AHA guidelines, Ashley Mcdonald would be at acceptable risk for the planned procedure without further cardiovascular testing.   Patient with diagnosis of A Fib on Eliquis for anticoagulation.    Procedure: colonoscopy Date of procedure: 06/18/20  CHADS2-VASc score of  4 (HTN, AGE, CAD,female)  CrCl 66 mL/min using adjusted body weight   Per office protocol, patient can hold Eliquis for 1-2 days prior to procedure.  I will route this recommendation to the requesting party via Epic fax function and remove from pre-op pool.  Please call with questions.  Ashley Ng. Aida Lemaire NP-C    05/23/2020, 8:06 AM [place] [address] Office [phone] Fax [phone]

## 2020-06-01 ENCOUNTER — Other Ambulatory Visit: Payer: Self-pay | Admitting: Cardiology

## 2020-06-17 DIAGNOSIS — Z1159 Encounter for screening for other viral diseases: Secondary | ICD-10-CM | POA: Diagnosis not present

## 2020-07-10 ENCOUNTER — Other Ambulatory Visit: Payer: Self-pay | Admitting: Cardiology

## 2020-07-13 DIAGNOSIS — R6883 Chills (without fever): Secondary | ICD-10-CM | POA: Diagnosis not present

## 2020-07-13 DIAGNOSIS — N390 Urinary tract infection, site not specified: Secondary | ICD-10-CM | POA: Diagnosis not present

## 2020-07-13 DIAGNOSIS — N3000 Acute cystitis without hematuria: Secondary | ICD-10-CM | POA: Diagnosis not present

## 2020-08-22 DIAGNOSIS — Z1331 Encounter for screening for depression: Secondary | ICD-10-CM | POA: Diagnosis not present

## 2020-08-22 DIAGNOSIS — Z79899 Other long term (current) drug therapy: Secondary | ICD-10-CM | POA: Diagnosis not present

## 2020-08-22 DIAGNOSIS — Z23 Encounter for immunization: Secondary | ICD-10-CM | POA: Diagnosis not present

## 2020-08-22 DIAGNOSIS — Z0000 Encounter for general adult medical examination without abnormal findings: Secondary | ICD-10-CM | POA: Diagnosis not present

## 2020-08-22 DIAGNOSIS — Z6831 Body mass index (BMI) 31.0-31.9, adult: Secondary | ICD-10-CM | POA: Diagnosis not present

## 2020-08-22 DIAGNOSIS — E782 Mixed hyperlipidemia: Secondary | ICD-10-CM | POA: Diagnosis not present

## 2020-08-22 DIAGNOSIS — I10 Essential (primary) hypertension: Secondary | ICD-10-CM | POA: Diagnosis not present

## 2020-08-22 DIAGNOSIS — E559 Vitamin D deficiency, unspecified: Secondary | ICD-10-CM | POA: Diagnosis not present

## 2020-09-10 DIAGNOSIS — M546 Pain in thoracic spine: Secondary | ICD-10-CM | POA: Diagnosis not present

## 2020-09-10 DIAGNOSIS — M9903 Segmental and somatic dysfunction of lumbar region: Secondary | ICD-10-CM | POA: Diagnosis not present

## 2020-09-10 DIAGNOSIS — M5442 Lumbago with sciatica, left side: Secondary | ICD-10-CM | POA: Diagnosis not present

## 2020-09-10 DIAGNOSIS — M5136 Other intervertebral disc degeneration, lumbar region: Secondary | ICD-10-CM | POA: Diagnosis not present

## 2020-09-10 DIAGNOSIS — M9902 Segmental and somatic dysfunction of thoracic region: Secondary | ICD-10-CM | POA: Diagnosis not present

## 2020-09-15 DIAGNOSIS — M5136 Other intervertebral disc degeneration, lumbar region: Secondary | ICD-10-CM | POA: Diagnosis not present

## 2020-09-15 DIAGNOSIS — M546 Pain in thoracic spine: Secondary | ICD-10-CM | POA: Diagnosis not present

## 2020-09-15 DIAGNOSIS — M9902 Segmental and somatic dysfunction of thoracic region: Secondary | ICD-10-CM | POA: Diagnosis not present

## 2020-09-15 DIAGNOSIS — M5442 Lumbago with sciatica, left side: Secondary | ICD-10-CM | POA: Diagnosis not present

## 2020-09-15 DIAGNOSIS — M9903 Segmental and somatic dysfunction of lumbar region: Secondary | ICD-10-CM | POA: Diagnosis not present

## 2020-09-30 DIAGNOSIS — Z1231 Encounter for screening mammogram for malignant neoplasm of breast: Secondary | ICD-10-CM | POA: Diagnosis not present

## 2020-10-02 DIAGNOSIS — L820 Inflamed seborrheic keratosis: Secondary | ICD-10-CM | POA: Diagnosis not present

## 2020-10-07 ENCOUNTER — Encounter: Payer: Self-pay | Admitting: Obstetrics & Gynecology

## 2020-10-07 DIAGNOSIS — R112 Nausea with vomiting, unspecified: Secondary | ICD-10-CM | POA: Insufficient documentation

## 2020-10-07 DIAGNOSIS — R011 Cardiac murmur, unspecified: Secondary | ICD-10-CM | POA: Insufficient documentation

## 2020-10-07 DIAGNOSIS — I10 Essential (primary) hypertension: Secondary | ICD-10-CM | POA: Insufficient documentation

## 2020-10-07 DIAGNOSIS — I2510 Atherosclerotic heart disease of native coronary artery without angina pectoris: Secondary | ICD-10-CM | POA: Insufficient documentation

## 2020-10-07 DIAGNOSIS — E785 Hyperlipidemia, unspecified: Secondary | ICD-10-CM | POA: Insufficient documentation

## 2020-10-07 DIAGNOSIS — N6019 Diffuse cystic mastopathy of unspecified breast: Secondary | ICD-10-CM | POA: Insufficient documentation

## 2020-10-07 DIAGNOSIS — M1990 Unspecified osteoarthritis, unspecified site: Secondary | ICD-10-CM | POA: Insufficient documentation

## 2020-10-07 DIAGNOSIS — R000 Tachycardia, unspecified: Secondary | ICD-10-CM | POA: Insufficient documentation

## 2020-10-07 DIAGNOSIS — T148XXA Other injury of unspecified body region, initial encounter: Secondary | ICD-10-CM | POA: Insufficient documentation

## 2020-10-07 DIAGNOSIS — T8859XA Other complications of anesthesia, initial encounter: Secondary | ICD-10-CM | POA: Insufficient documentation

## 2020-10-07 DIAGNOSIS — A0811 Acute gastroenteropathy due to Norwalk agent: Secondary | ICD-10-CM | POA: Insufficient documentation

## 2020-10-08 DIAGNOSIS — M5442 Lumbago with sciatica, left side: Secondary | ICD-10-CM | POA: Diagnosis not present

## 2020-10-08 DIAGNOSIS — M5136 Other intervertebral disc degeneration, lumbar region: Secondary | ICD-10-CM | POA: Diagnosis not present

## 2020-10-08 DIAGNOSIS — M9903 Segmental and somatic dysfunction of lumbar region: Secondary | ICD-10-CM | POA: Diagnosis not present

## 2020-10-08 DIAGNOSIS — M9902 Segmental and somatic dysfunction of thoracic region: Secondary | ICD-10-CM | POA: Diagnosis not present

## 2020-10-08 DIAGNOSIS — M546 Pain in thoracic spine: Secondary | ICD-10-CM | POA: Diagnosis not present

## 2020-10-09 ENCOUNTER — Ambulatory Visit (INDEPENDENT_AMBULATORY_CARE_PROVIDER_SITE_OTHER): Payer: Medicare Other | Admitting: Cardiology

## 2020-10-09 ENCOUNTER — Encounter: Payer: Self-pay | Admitting: Cardiology

## 2020-10-09 ENCOUNTER — Other Ambulatory Visit: Payer: Self-pay

## 2020-10-09 VITALS — BP 124/70 | HR 76 | Ht 63.5 in | Wt 178.6 lb

## 2020-10-09 DIAGNOSIS — E782 Mixed hyperlipidemia: Secondary | ICD-10-CM | POA: Diagnosis not present

## 2020-10-09 DIAGNOSIS — I2510 Atherosclerotic heart disease of native coronary artery without angina pectoris: Secondary | ICD-10-CM | POA: Diagnosis not present

## 2020-10-09 DIAGNOSIS — I10 Essential (primary) hypertension: Secondary | ICD-10-CM | POA: Diagnosis not present

## 2020-10-09 DIAGNOSIS — I480 Paroxysmal atrial fibrillation: Secondary | ICD-10-CM | POA: Diagnosis not present

## 2020-10-09 NOTE — Patient Instructions (Signed)

## 2020-10-09 NOTE — Progress Notes (Signed)
Cardiology Office Note:    Date:  10/09/2020   ID:  [name], DOB [birth year], MRN [id number]  PCP:  Ronita Hipps, MD  Cardiologist:  Jenean Lindau, MD   Referring MD: No ref. provider found    ASSESSMENT:    1. Coronary artery disease involving native coronary artery of native heart without angina pectoris   2. Essential hypertension   3. Mixed hyperlipidemia   4. PAF (paroxysmal atrial fibrillation) (HCC)    PLAN:    In order of problems listed above:  1. Coronary artery disease: Secondary prevention stressed with the patient.  Importance of compliance with diet medication stressed and she vocalized understanding.  Importance of regular exercise stressed.  I told her to walk at least half an hour a day 5 times a week and she promises to do so. 2. Essential hypertension: Blood pressure stable and diet was emphasized 3. Mixed dyslipidemia: Patient on statin therapy and lipids are fine and I reviewed them from the [facility] sheet 4. Paroxysmal atrial fibrillation:I discussed with the patient atrial fibrillation, disease process. Management and therapy including rate and rhythm control, anticoagulation benefits and potential risks were discussed extensively with the patient. Patient had multiple questions which were answered to patient's satisfaction.  Tolerating flecainide well and will continue this.  EKG as mentioned above 5. Patient will be seen in follow-up appointment in 6 months or earlier if the patient has any concerns    Medication Adjustments/Labs and Tests Ordered: Current medicines are reviewed at length with the patient today.  Concerns regarding medicines are outlined above.  No orders of the defined types were placed in this encounter.  No orders of the defined types were placed in this encounter.    No chief complaint on file.    History of Present Illness:    Ashley Mcdonald is a 69 y.o. female.  Patient has past medical history of coronary artery  disease, essential hypertension and paroxysmal atrial fibrillation.  She denies any problems at this time and takes care of activities of daily living.  No chest pain orthopnea or PND.  She has gained some weight over the past several months.  She has been not very compliant with diet and exercise.  She denies any palpitations or any such issues.  Past Medical History:  Diagnosis Date  . Arthritis   . Arthritis of left hip 03/22/2016  . CAD (coronary artery disease) 09/16/2017  . 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 Complication of anesthesia   . Coronary artery disease   . Cystic breast   . Essential hypertension 08/01/2017  . Heart murmur    " nothing to be concerned about"  . Hyperlipidemia   . Hypertension   . Mixed dyslipidemia 04/09/2020  . Norovirus   . PAF (paroxysmal atrial fibrillation) ([place]) 02/14/2018  . Plantar fasciitis 2006   right  . PONV (postoperative nausea and vomiting)   . Primary osteoarthritis of first carpometacarpal joint of right hand 11/28/2015  . Primary osteoarthritis of left hip 03/20/2016  . Puncture wound    right leg  . Shortness of breath dyspnea    with exertion  . 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  . Supraventricular tachycardia ([place]) 08/01/2017  . Tachycardia   . Trigger finger of right thumb 11/28/2015    Past Surgical History:  Procedure Laterality Date  . COLONOSCOPY    . HYSTEROSCOPY     D&C polypectomy  . LEFT HEART CATH AND CORONARY ANGIOGRAPHY N/A 08/23/2017  Procedure: LEFT HEART CATH AND CORONARY ANGIOGRAPHY;  Surgeon: Belva Crome, MD;  Location: [place] [facility];  Service: Cardiovascular;  Laterality: N/A;  . SPINAL FUSION  2008   c5-7  . TOTAL HIP ARTHROPLASTY Left 03/22/2016   Procedure: TOTAL HIP ARTHROPLASTY ANTERIOR APPROACH;  Surgeon: Frederik Pear, MD;  Location: [facility];  Service: Orthopedics;  Laterality: Left;    Current Medications: Current Meds  Medication Sig  . acetaminophen (TYLENOL) 650 MG CR tablet  Take 650 mg by mouth at bedtime as needed for pain.  Marland Kitchen atorvastatin (LIPITOR) 10 MG tablet TAKE 1 TABLET DAILY  . Calcium Carbonate-Vitamin D (CALCIUM-D PO) Take 3 tablets by mouth every evening.  . cyclobenzaprine (FLEXERIL) 5 MG tablet Take 5 mg by mouth daily as needed for muscle spasms.  Marland Kitchen doxepin (SINEQUAN) 10 MG capsule Take 10 mg by mouth as needed.  Marland Kitchen ELIQUIS 5 MG TABS tablet TAKE 1 TABLET BY MOUTH TWICE A DAY  . flecainide (TAMBOCOR) 50 MG tablet TAKE 1 TABLET TWICE A DAY  . Menthol (BIOFREEZE) 10 % AERO Apply 1 application topically daily as needed (Muscle pain).  . metoprolol succinate (TOPROL-XL) 50 MG 24 hr tablet Take 1 tablet by mouth daily.  Marland Kitchen telmisartan-hydrochlorothiazide (MICARDIS HCT) 40-12.5 MG tablet Take 1 tablet by mouth every morning.      Allergies:   Nsaids   Social History   Socioeconomic History  . Marital status: Married    Spouse name: Not on file  . Number of children: Not on file  . Years of education: Not on file  . Highest education level: Not on file  Occupational History  . Not on file  Tobacco Use  . Smoking status: Never Smoker  . Smokeless tobacco: Never Used  Vaping Use  . Vaping Use: Never used  Substance and Sexual Activity  . Alcohol use: No    Alcohol/week: 0.0 standard drinks  . Drug use: No  . Sexual activity: Not Currently    Partners: Male    Birth control/protection: Post-menopausal  Other Topics Concern  . Not on file  Social History Narrative  . Not on file   Social Determinants of Health   Financial Resource Strain:   . Difficulty of Paying Living Expenses: Not on file  Food Insecurity:   . Worried About Charity fundraiser in the Last Year: Not on file  . Ran Out of Food in the Last Year: Not on file  Transportation Needs:   . Lack of Transportation (Medical): Not on file  . Lack of Transportation (Non-Medical): Not on file  Physical Activity:   . Days of Exercise per Week: Not on file  . Minutes of Exercise  per Session: Not on file  Stress:   . Feeling of Stress : Not on file  Social Connections:   . Frequency of Communication with Friends and Family: Not on file  . Frequency of Social Gatherings with Friends and Family: Not on file  . Attends Religious Services: Not on file  . Active Member of Clubs or Organizations: Not on file  . Attends Archivist Meetings: Not on file  . Marital Status: Not on file     Family History: The patient's family history includes Cancer in her maternal grandmother; Diabetes in her paternal grandmother; Heart attack in her father; Stroke in her mother.  ROS:   Please see the history of present illness.    All other systems reviewed and are negative.  EKGs/Labs/Other  Studies Reviewed:    The following studies were reviewed today: EKG reveals sinus rhythm and nonspecific ST-T changes and was compared with the previous EKG and found to be unremarkable.  QRS was within normal limits.   Recent Labs: 04/17/2020: ALT 19; BUN 13; Creatinine, Ser 0.82; Hemoglobin 13.0; Platelets 196; Potassium 4.3; Sodium 141; TSH 3.840  Recent Lipid Panel    Component Value Date/Time   CHOL 117 04/17/2020 0814   TRIG 91 04/17/2020 0814   HDL 34 (L) 04/17/2020 0814   CHOLHDL 3.4 04/17/2020 0814   LDLCALC 65 04/17/2020 0814    Physical Exam:    VS:  BP 124/70   Pulse 76   Ht 5' 3.5" (1.613 m)   Wt 178 lb 9.6 oz (81 kg)   LMP 09/12/2002   SpO2 97%   BMI 31.14 kg/m     Wt Readings from Last 3 Encounters:  10/09/20 178 lb 9.6 oz (81 kg)  04/17/20 179 lb (81.2 kg)  04/09/20 179 lb 9.6 oz (81.5 kg)     GEN: Patient is in no acute distress HEENT: Normal NECK: No JVD; No carotid bruits LYMPHATICS: No lymphadenopathy CARDIAC: Hear sounds regular, 2/6 systolic murmur at the apex. RESPIRATORY:  Clear to auscultation without rales, wheezing or rhonchi  ABDOMEN: Soft, non-tender, non-distended MUSCULOSKELETAL:  No edema; No deformity  SKIN: Warm and  dry NEUROLOGIC:  Alert and oriented x 3 PSYCHIATRIC:  Normal affect   Signed, Jenean Lindau, MD  10/09/2020 4:33 PM     [facility]

## 2020-10-23 DIAGNOSIS — M5442 Lumbago with sciatica, left side: Secondary | ICD-10-CM | POA: Diagnosis not present

## 2020-10-23 DIAGNOSIS — M25552 Pain in left hip: Secondary | ICD-10-CM | POA: Diagnosis not present

## 2020-10-23 DIAGNOSIS — M545 Low back pain, unspecified: Secondary | ICD-10-CM | POA: Diagnosis not present

## 2020-10-28 ENCOUNTER — Other Ambulatory Visit: Payer: Self-pay | Admitting: Cardiology

## 2020-10-30 DIAGNOSIS — M545 Low back pain, unspecified: Secondary | ICD-10-CM | POA: Diagnosis not present

## 2020-11-04 DIAGNOSIS — M25552 Pain in left hip: Secondary | ICD-10-CM | POA: Diagnosis not present

## 2020-11-05 DIAGNOSIS — M546 Pain in thoracic spine: Secondary | ICD-10-CM | POA: Diagnosis not present

## 2020-11-05 DIAGNOSIS — M5442 Lumbago with sciatica, left side: Secondary | ICD-10-CM | POA: Diagnosis not present

## 2020-11-05 DIAGNOSIS — M9903 Segmental and somatic dysfunction of lumbar region: Secondary | ICD-10-CM | POA: Diagnosis not present

## 2020-11-05 DIAGNOSIS — M5136 Other intervertebral disc degeneration, lumbar region: Secondary | ICD-10-CM | POA: Diagnosis not present

## 2020-11-05 DIAGNOSIS — M9902 Segmental and somatic dysfunction of thoracic region: Secondary | ICD-10-CM | POA: Diagnosis not present

## 2020-11-11 DIAGNOSIS — I10 Essential (primary) hypertension: Secondary | ICD-10-CM | POA: Diagnosis not present

## 2020-11-11 DIAGNOSIS — E782 Mixed hyperlipidemia: Secondary | ICD-10-CM | POA: Diagnosis not present

## 2020-11-11 DIAGNOSIS — I4891 Unspecified atrial fibrillation: Secondary | ICD-10-CM | POA: Diagnosis not present

## 2020-11-11 DIAGNOSIS — E559 Vitamin D deficiency, unspecified: Secondary | ICD-10-CM | POA: Diagnosis not present

## 2020-11-11 DIAGNOSIS — E7849 Other hyperlipidemia: Secondary | ICD-10-CM | POA: Diagnosis not present

## 2020-11-26 ENCOUNTER — Telehealth: Payer: Self-pay | Admitting: Cardiology

## 2020-11-26 MED ORDER — FLECAINIDE ACETATE 50 MG PO TABS: 50.0000 mg | ORAL_TABLET | Freq: Two times a day (BID) | ORAL | 3 refills | Status: DC

## 2020-11-26 NOTE — Telephone Encounter (Signed)
*  STAT* If patient is at the pharmacy, call can be transferred to refill team.   1. Which medications need to be refilled? (please list name of each medication and dose if known)  flecainide (TAMBOCOR) 50 MG tablet  2. Which pharmacy/location (including street and city if local pharmacy) is medication to be sent to? [facility] phone: [phone] fax # [phone] [place] [place], [place] [address] 3. Do they need a 30 day or 90 day supply? [place]

## 2020-11-26 NOTE — Telephone Encounter (Signed)
Refill sent in per request.  

## 2020-12-02 DIAGNOSIS — M546 Pain in thoracic spine: Secondary | ICD-10-CM | POA: Diagnosis not present

## 2020-12-02 DIAGNOSIS — M5442 Lumbago with sciatica, left side: Secondary | ICD-10-CM | POA: Diagnosis not present

## 2020-12-02 DIAGNOSIS — M5136 Other intervertebral disc degeneration, lumbar region: Secondary | ICD-10-CM | POA: Diagnosis not present

## 2020-12-02 DIAGNOSIS — M9902 Segmental and somatic dysfunction of thoracic region: Secondary | ICD-10-CM | POA: Diagnosis not present

## 2020-12-02 DIAGNOSIS — M9903 Segmental and somatic dysfunction of lumbar region: Secondary | ICD-10-CM | POA: Diagnosis not present

## 2020-12-11 DIAGNOSIS — I10 Essential (primary) hypertension: Secondary | ICD-10-CM | POA: Diagnosis not present

## 2020-12-11 DIAGNOSIS — E559 Vitamin D deficiency, unspecified: Secondary | ICD-10-CM | POA: Diagnosis not present

## 2020-12-11 DIAGNOSIS — E782 Mixed hyperlipidemia: Secondary | ICD-10-CM | POA: Diagnosis not present

## 2020-12-31 DIAGNOSIS — M5442 Lumbago with sciatica, left side: Secondary | ICD-10-CM | POA: Diagnosis not present

## 2020-12-31 DIAGNOSIS — M9902 Segmental and somatic dysfunction of thoracic region: Secondary | ICD-10-CM | POA: Diagnosis not present

## 2020-12-31 DIAGNOSIS — M9903 Segmental and somatic dysfunction of lumbar region: Secondary | ICD-10-CM | POA: Diagnosis not present

## 2020-12-31 DIAGNOSIS — M546 Pain in thoracic spine: Secondary | ICD-10-CM | POA: Diagnosis not present

## 2020-12-31 DIAGNOSIS — M5136 Other intervertebral disc degeneration, lumbar region: Secondary | ICD-10-CM | POA: Diagnosis not present

## 2021-01-11 DIAGNOSIS — I10 Essential (primary) hypertension: Secondary | ICD-10-CM | POA: Diagnosis not present

## 2021-01-11 DIAGNOSIS — E782 Mixed hyperlipidemia: Secondary | ICD-10-CM | POA: Diagnosis not present

## 2021-01-11 DIAGNOSIS — E559 Vitamin D deficiency, unspecified: Secondary | ICD-10-CM | POA: Diagnosis not present

## 2021-01-21 ENCOUNTER — Ambulatory Visit: Payer: Medicare Other | Admitting: Obstetrics and Gynecology

## 2021-01-28 DIAGNOSIS — M9902 Segmental and somatic dysfunction of thoracic region: Secondary | ICD-10-CM | POA: Diagnosis not present

## 2021-01-28 DIAGNOSIS — M5442 Lumbago with sciatica, left side: Secondary | ICD-10-CM | POA: Diagnosis not present

## 2021-01-28 DIAGNOSIS — M5136 Other intervertebral disc degeneration, lumbar region: Secondary | ICD-10-CM | POA: Diagnosis not present

## 2021-01-28 DIAGNOSIS — M546 Pain in thoracic spine: Secondary | ICD-10-CM | POA: Diagnosis not present

## 2021-01-28 DIAGNOSIS — M9903 Segmental and somatic dysfunction of lumbar region: Secondary | ICD-10-CM | POA: Diagnosis not present

## 2021-01-29 ENCOUNTER — Telehealth: Payer: Self-pay | Admitting: Cardiology

## 2021-01-29 MED ORDER — ATORVASTATIN CALCIUM 10 MG PO TABS: 10.0000 mg | ORAL_TABLET | Freq: Every day | ORAL | 2 refills | Status: DC

## 2021-01-29 MED ORDER — APIXABAN 5 MG PO TABS: 5.0000 mg | ORAL_TABLET | Freq: Two times a day (BID) | ORAL | 1 refills | Status: DC

## 2021-01-29 NOTE — Telephone Encounter (Signed)
   *  STAT* If patient is at the pharmacy, call can be transferred to refill team.   1. Which medications need to be refilled? (please list name of each medication and dose if known) atorvastatin (LIPITOR) 10 MG tablet ELIQUIS 5 MG TABS tablet  2. Which pharmacy/location (including street and city if local pharmacy) is medication to be sent to? [place] [facility]  3. Do they need a 30 day or 90 day supply? 90 days

## 2021-02-09 DIAGNOSIS — E782 Mixed hyperlipidemia: Secondary | ICD-10-CM | POA: Diagnosis not present

## 2021-02-09 DIAGNOSIS — I10 Essential (primary) hypertension: Secondary | ICD-10-CM | POA: Diagnosis not present

## 2021-02-09 DIAGNOSIS — E559 Vitamin D deficiency, unspecified: Secondary | ICD-10-CM | POA: Diagnosis not present

## 2021-02-19 DIAGNOSIS — M9902 Segmental and somatic dysfunction of thoracic region: Secondary | ICD-10-CM | POA: Diagnosis not present

## 2021-02-19 DIAGNOSIS — M5442 Lumbago with sciatica, left side: Secondary | ICD-10-CM | POA: Diagnosis not present

## 2021-02-19 DIAGNOSIS — M5136 Other intervertebral disc degeneration, lumbar region: Secondary | ICD-10-CM | POA: Diagnosis not present

## 2021-02-19 DIAGNOSIS — M9903 Segmental and somatic dysfunction of lumbar region: Secondary | ICD-10-CM | POA: Diagnosis not present

## 2021-02-19 DIAGNOSIS — M546 Pain in thoracic spine: Secondary | ICD-10-CM | POA: Diagnosis not present

## 2021-02-24 DIAGNOSIS — Z6831 Body mass index (BMI) 31.0-31.9, adult: Secondary | ICD-10-CM | POA: Diagnosis not present

## 2021-02-24 DIAGNOSIS — G4700 Insomnia, unspecified: Secondary | ICD-10-CM | POA: Diagnosis not present

## 2021-03-02 NOTE — Progress Notes (Signed)
70 y.o. G1P0010 Married White or Caucasian Not Hispanic or Latino female here for annual exam.  Patient states that she is having pain with sex.  She states that she has been using Replens every 3 days but she does not feel like it helps her.  She has entry dyspareunia. Not helped with lubrication. The lubrication burns. Lately not able to complete penetration secondary to pain. No vaginal bleeding. No bowel or bladder c/o. No incontinence.     Patient's last menstrual period was 09/12/2002.          Sexually active: Yes.    The current method of family planning is post menopausal status.    Exercising: Yes.    walking Smoker:  no  Health Maintenance: Pap:  10/29/16 (last pap at 27) History of abnormal Pap:  no MMG:  09/30/20 Bi-rads 1 neg  BMD:   08/29/19 osteopenia  Colonoscopy: 2017 f/u 83yrs TDaP:  2019  Gardasil: NA   reports that she has never smoked. She has never used smokeless tobacco. She reports that she does not drink alcohol and does not use drugs. Retired. Married x 38 years, great marriage. 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 Having fun together.   Past Medical History:  Diagnosis Date  . Arthritis   . Arthritis of left hip 03/22/2016  . CAD (coronary artery disease) 09/16/2017  . 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 Complication of anesthesia   . Coronary artery disease   . Cystic breast   . Essential hypertension 08/01/2017  . Heart murmur    " nothing to be concerned about"  . Hyperlipidemia   . Hypertension   . Mixed dyslipidemia 04/09/2020  . Norovirus   . PAF (paroxysmal atrial fibrillation) ([place]) 02/14/2018  . Plantar fasciitis 2006   right  . PONV (postoperative nausea and vomiting)   . Primary osteoarthritis of first carpometacarpal joint of right hand 11/28/2015  . Primary osteoarthritis of left hip 03/20/2016  . Puncture wound    right leg  . Shortness of breath dyspnea    with exertion  . 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  . Supraventricular  tachycardia ([place]) 08/01/2017  . Tachycardia   . Trigger finger of right thumb 11/28/2015    Past Surgical History:  Procedure Laterality Date  . COLONOSCOPY    . HYSTEROSCOPY     D&C polypectomy  . LEFT HEART CATH AND CORONARY ANGIOGRAPHY N/A 08/23/2017   Procedure: LEFT HEART CATH AND CORONARY ANGIOGRAPHY;  Surgeon: Belva Crome, MD;  Location: [place] [facility];  Service: Cardiovascular;  Laterality: N/A;  . SPINAL FUSION  2008   c5-7  . TOTAL HIP ARTHROPLASTY Left 03/22/2016   Procedure: TOTAL HIP ARTHROPLASTY ANTERIOR APPROACH;  Surgeon: Frederik Pear, MD;  Location: [facility];  Service: Orthopedics;  Laterality: Left;    Current Outpatient Medications  Medication Sig Dispense Refill  . acetaminophen (TYLENOL) 650 MG CR tablet Take 650 mg by mouth at bedtime as needed for pain.    Marland Kitchen apixaban (ELIQUIS) 5 MG TABS tablet Take 1 tablet (5 mg total) by mouth 2 (two) times daily. 180 tablet 1  . atorvastatin (LIPITOR) 10 MG tablet Take 1 tablet (10 mg total) by mouth daily. 90 tablet 2  . Calcium Carbonate-Vitamin D (CALCIUM-D PO) Take 3 tablets by mouth every evening.    . cyclobenzaprine (FLEXERIL) 5 MG tablet Take 5 mg by mouth daily as needed for muscle spasms.    Marland Kitchen doxepin (SINEQUAN) 10 MG capsule Take 10  mg by mouth as needed.    . flecainide (TAMBOCOR) 50 MG tablet Take 1 tablet (50 mg total) by mouth 2 (two) times daily. 180 tablet 3  . Menthol 10 % AERO Apply 1 application topically daily as needed (Muscle pain).    . metoprolol succinate (TOPROL-XL) 50 MG 24 hr tablet Take 1 tablet by mouth daily.    Marland Kitchen telmisartan-hydrochlorothiazide (MICARDIS HCT) 40-12.5 MG tablet Take 1 tablet by mouth every morning.   3   No current facility-administered medications for this visit.    Family History  Problem Relation Age of Onset  . Stroke Mother   . Heart attack Father   . Cancer Maternal Grandmother        uterine?  . Diabetes Paternal Grandmother     Review of Systems   Genitourinary: Positive for vaginal pain.  All other systems reviewed and are negative.   Exam:   BP 122/64   Pulse 66   Ht 5' 3.5" (1.613 m)   Wt 177 lb 12.8 oz (80.6 kg)   LMP 09/12/2002   SpO2 100%   BMI 31.00 kg/m   Weight change: @WEIGHTCHANGE @ Height:   Height: 5' 3.5" (161.3 cm)  Ht Readings from Last 3 Encounters:  03/03/21 5' 3.5" (1.613 m)  10/09/20 5' 3.5" (1.613 m)  04/17/20 5\' 3"  (1.6 m)    General appearance: alert, cooperative and appears stated age Head: Normocephalic, without obvious abnormality, atraumatic Neck: no adenopathy, supple, symmetrical, trachea midline and thyroid normal to inspection and palpation Breasts: normal appearance, no masses or tenderness Abdomen: soft, non-tender; non distended,  no masses,  no organomegaly Extremities: extremities normal, atraumatic, no cyanosis or edema Skin: Skin color, texture, turgor normal. No rashes or lesions Lymph nodes: Cervical, supraclavicular, and axillary nodes normal. No abnormal inguinal nodes palpated Neurologic: Grossly normal   Pelvic: External genitalia:  no lesions              Urethra:  normal appearing urethra with no masses, tenderness or lesions              Bartholins and Skenes: normal                 Vagina: atrophic appearing vagina, narrow towards the apex              Cervix: no lesions               Bimanual Exam:  Uterus:  normal size, contour, position, consistency, mobility, non-tender              Adnexa: no mass, fullness, tenderness               Rectovaginal: Confirms               Anus:  normal sphincter tone, no lesions  Gae Dry chaperoned for the exam.    1. Encounter for gynecological examination without abnormal finding Discussed breast self exam Mammogram UTD  2. Dyspareunia, female Continue to use lubrication with intercourse, she should control rate and depth of penetration - Estradiol 10 MCG TABS vaginal tablet; Place one tablet vaginally qhs x 1 week,  then change to 2 x a week.  Dispense: 24 tablet; Refill: 4 -The top of her vagina is quite narrow. I suspect that she may need vaginal dilators. She will try the estrogen for a month, and if needed then order vaginal dilators on line.  3. Vaginal atrophy  - Estradiol 10 MCG TABS vaginal tablet;  Place one tablet vaginally qhs x 1 week, then change to 2 x a week.  Dispense: 24 tablet; Refill: 4  4. History of osteopenia Discussed calcium and vit D intake Continue to exercise Dexa ordered at [place] for 9/22  5. History of UTI - Urinalysis,Complete w/RFL Culture Reviewed that no f/u is needed

## 2021-03-03 ENCOUNTER — Ambulatory Visit (INDEPENDENT_AMBULATORY_CARE_PROVIDER_SITE_OTHER): Payer: Medicare Other | Admitting: Obstetrics and Gynecology

## 2021-03-03 ENCOUNTER — Other Ambulatory Visit: Payer: Self-pay

## 2021-03-03 ENCOUNTER — Encounter: Payer: Self-pay | Admitting: Obstetrics and Gynecology

## 2021-03-03 VITALS — BP 122/64 | HR 66 | Ht 63.5 in | Wt 177.8 lb

## 2021-03-03 DIAGNOSIS — N941 Unspecified dyspareunia: Secondary | ICD-10-CM | POA: Diagnosis not present

## 2021-03-03 DIAGNOSIS — Z8739 Personal history of other diseases of the musculoskeletal system and connective tissue: Secondary | ICD-10-CM | POA: Diagnosis not present

## 2021-03-03 DIAGNOSIS — N952 Postmenopausal atrophic vaginitis: Secondary | ICD-10-CM | POA: Diagnosis not present

## 2021-03-03 DIAGNOSIS — R8290 Unspecified abnormal findings in urine: Secondary | ICD-10-CM | POA: Diagnosis not present

## 2021-03-03 DIAGNOSIS — Z87440 Personal history of urinary (tract) infections: Secondary | ICD-10-CM | POA: Diagnosis not present

## 2021-03-03 DIAGNOSIS — Z01419 Encounter for gynecological examination (general) (routine) without abnormal findings: Secondary | ICD-10-CM

## 2021-03-03 DIAGNOSIS — R82998 Other abnormal findings in urine: Secondary | ICD-10-CM | POA: Diagnosis not present

## 2021-03-03 MED ORDER — ESTRADIOL 10 MCG VA TABS: ORAL_TABLET | VAGINAL | 4 refills | Status: DC

## 2021-03-03 NOTE — Patient Instructions (Addendum)
Try Uberlube for vaginal lubrication.   You may need to buy vaginal dilators on line   Atrophic Vaginitis  Atrophic vaginitis is a condition in which the tissues that line the vagina become dry and thin. This condition is most common in women who have stopped having regular menstrual periods (are in menopause). This usually starts when a woman is 29 to 70 years old. That is the time when a woman's estrogen levels begin to decrease. Estrogen is a female hormone. It helps to keep the tissues of the vagina moist. It stimulates the vagina to produce a clear fluid that lubricates the vagina for sex. This fluid also protects the vagina from infection. Lack of estrogen can cause the lining of the vagina to get thinner and dryer. The vagina may also shrink in size. It may become less elastic. Atrophic vaginitis tends to get worse over time as a woman's estrogen level drops. What are the causes? This condition is caused by the normal drop in estrogen that happens around the time of menopause. What increases the risk? Certain conditions or situations may lower a woman's estrogen level, leading to a higher risk for atrophic vaginitis. You are more likely to develop this condition if:  You are taking medicines that block estrogen.  You have had your ovaries removed.  You are being treated for cancer with radiation or medicines (chemotherapy).  You have given birth or are breastfeeding.  You are older than age 32.  You smoke. What are the signs or symptoms? Symptoms of this condition include:  Pain, soreness, a feeling of pressure, or bleeding during sex (dyspareunia).  Vaginal burning, irritation, or itching.  Pain or bleeding when a speculum is used in a vaginal exam.  Having burning pain while urinating.  Vaginal discharge. In some cases, there are no symptoms. How is this diagnosed? This condition is diagnosed based on your medical history and a physical exam. This will include a pelvic  exam that checks the vaginal tissues. Though rare, you may also have other tests, including:  A urine test.  A test that checks the acid balance in your vagina (acid balance test). How is this treated? Treatment for this condition depends on how severe your symptoms are. Treatment may include:  Using an over-the-counter vaginal lubricant before sex.  Using a long-acting vaginal moisturizer.  Using low-dose estrogen for moderate to severe symptoms that do not respond to other treatments. Options include creams, tablets, and inserts (vaginal rings). Before you use a vaginal estrogen, tell your health care provider if you have a history of: ? Breast cancer. ? Endometrial cancer. ? Blood clots. If you are not sexually active and your symptoms are very mild, you may not need treatment. Follow these instructions at home: Medicines  Take over-the-counter and prescription medicines only as told by your health care provider.  Do not use herbal or alternative medicines unless your health care provider says that you can.  Use over-the-counter creams, lubricants, or moisturizers for dryness only as told by your health care provider. General instructions  If your atrophic vaginitis is caused by menopause, discuss all of your menopause symptoms and treatment options with your health care provider.  Do not douche.  Do not use products that can make your vagina dry. These include: ? Scented feminine sprays. ? Scented tampons. ? Scented soaps.  Vaginal sex can help to improve blood flow and elasticity of vaginal tissue. If you choose to have sex and it hurts, try using a  water-soluble lubricant or moisturizer right before having sex. Contact a health care provider if:  Your discharge looks different than normal.  Your vagina has an unusual smell.  You have new symptoms.  Your symptoms do not improve with treatment.  Your symptoms get worse. Summary  Atrophic vaginitis is a condition  in which the tissues that line the vagina become dry and thin. It is most common in women who have stopped having regular menstrual periods (are in menopause).  Treatment options include using vaginal lubricants and low-dose vaginal estrogen.  Contact a health care provider if your vagina has an unusual smell, or if your symptoms get worse or do not improve after treatment. This information is not intended to replace advice given to you by your health care provider. Make sure you discuss any questions you have with your health care provider. Document Revised: 05/29/2020 Document Reviewed: 05/29/2020 Elsevier Patient Education  2021 [place].  Dyspareunia, Female Dyspareunia is pain that is associated with sexual activity. This can affect any part of the genitals or lower abdomen. There are many possible causes of this condition. In some cases, diagnosing the cause of dyspareunia can be difficult. This condition can be mild, moderate, or severe. Depending on the cause, dyspareunia may get better with treatment, but may return (recur) over time. What are the causes? The cause of this condition is not always known. However, problems that affect the vulva, vagina, uterus, and other organs may cause dyspareunia. Common causes of this condition include:  Severe pain and tenderness of the vulva when it is touched (vulvodynia).  Vaginal dryness.  Giving birth.  Infection.  Skin changes or conditions.  Side effects of medicines.  Endometriosis. This is when tissue that is like the lining of the uterus grows on the outside of the uterus.  Psychological conditions. These include depression, anxiety, or traumatic experiences.  Allergic reaction.   What increases the risk? The following factors may make you more likely to develop this condition:  History of physical or sexual trauma.  Some medicines.  No longer having a monthly period (menopause).  Having recently given  birth.  Taking baths using soaps that have perfumes. These can cause irritation.  Douching. What are the signs or symptoms? The main symptom of this condition is pain in any part of your genitals or lower abdomen during or after sex. This may include:  Irritation, burning, or stinging sensations in your vulva.  Discomfort when your vulva or surrounding area is touched.  Aching and throbbing pain that may be constant.  Pain that gets worse when something is inserted into your vagina. How is this diagnosed? This condition may be diagnosed based on:  Your symptoms, including where and when your pain occurs.  Your medical history.  A physical exam. A pelvic exam will most likely be done.  Tests that include ultrasound, blood tests, and tests that check the body for infection.  Imaging tests, such as X-ray, MRI, and CT scan. You may be referred to a health care provider who specializes in women's health (gynecologist). How is this treated? Treatment depends on the cause of your condition and your symptoms. In most cases, you may need to stop sexual activity until your symptoms go away or get better. Treatment may include:  Lubricants, ointments, and creams.  Physical therapy.  Massage therapy.  Hormonal therapy.  Medicines to: ? Prevent or fight infection. ? Relieve pain. ? Help numb the area. ? Treat depression (antidepressants).  Counseling, which  may include sex therapy.  Surgery. Follow these instructions at home: Lifestyle  Wear cotton underwear.  Use water-based lubricants as needed during sex. Avoid oil-based lubricants.  Do not use any products that can cause irritation. This may include certain condoms, spermicides, lubricants, soaps, tampons, vaginal sprays, or douches.  Always practice safe sex. Use a condom to prevent sexually transmitted infections (STIs).  Talk freely with your partner about your condition. General instructions  Take or apply  over-the-counter and prescription medicines only as told by your health care provider.  Urinate before you have sex.  Consider joining a support group.  Get the results of any tests you have done. Ask your health care provider, or the department that is doing the procedure, when your results will be ready.  Keep all follow-up visits as told by your health care provider. This is important. Contact a health care provider if:  You have vaginal bleeding after having sex.  You develop a lump at the opening of your vagina even if the lump is painless.  You have: ? Abnormal discharge from your vagina. ? Vaginal dryness. ? Itchiness or irritation of your vulva or vagina. ? A new rash. ? Symptoms that get worse or do not improve with treatment. ? A fever. ? Pain when you urinate. ? Blood in your urine. Get help right away if:  You have severe pain in your abdomen during or shortly after sex.  You pass out after sex. Summary  Dyspareunia is pain that is associated with sexual activity. This can affect any part of the genitals or lower abdomen.  There are many causes of this condition. Treatment depends on the cause and your symptoms. In most cases, you may need to stop sexual activity until your symptoms improve.  Take or apply over-the-counter and prescription medicines only as told by your health care provider.  Contact a health care provider if your symptoms get worse or do not improve with treatment.  Keep all follow-up visits as told by your health care provider. This is important. This information is not intended to replace advice given to you by your health care provider. Make sure you discuss any questions you have with your health care provider. Document Revised: 01/10/2020 Document Reviewed: 02/05/2019 Elsevier Patient Education  2021 [place]   We recommended that you start or continue a regular exercise program for good health. Physical activity is  anything that gets your body moving, some is better than none. The CDC recommends 150 minutes per week of Moderate-Intensity Aerobic Activity and 2 or more days of Muscle Strengthening Activity.  Benefits of exercise are limitless: helps weight loss/weight maintenance, improves mood and energy, helps with depression and anxiety, improves sleep, tones and strengthens muscles, improves balance, improves bone density, protects from chronic conditions such as heart disease, high blood pressure and diabetes and so much more. To learn more visit: WhyNotPoker.uy  DIET: Good nutrition starts with a healthy diet of fruits, vegetables, whole grains, and lean protein sources. Drink plenty of water for hydration. Minimize empty calories, sodium, sweets. For more information about dietary recommendations visit: GeekRegister.com.ee and http://schaefer-mitchell.com/  ALCOHOL:  Women should limit their alcohol intake to no more than 7 drinks/beers/glasses of wine (combined, not each!) per week. Moderation of alcohol intake to this level decreases your risk of breast cancer and liver damage.  If you are concerned that you may have a problem, or your friends have told you they are concerned about your drinking,  there are many resources to help. A well-known program that is free, effective, and available to all people all over the nation is Alcoholics Anonymous.  Check out this site to learn more: BlockTaxes.se   CALCIUM AND VITAMIN D:  Adequate intake of calcium and Vitamin D are recommended for bone health.  You should be getting between 1000-1200 mg of calcium and 800 units of Vitamin D daily between diet and supplements  PAP SMEARS:  Pap smears, to check for cervical cancer or precancers,  have traditionally been done yearly, scientific advances have shown that most women can have pap smears less often.  However, every woman still  should have a physical exam from her gynecologist every year. It will include a breast check, inspection of the vulva and vagina to check for abnormal growths or skin changes, a visual exam of the cervix, and then an exam to evaluate the size and shape of the uterus and ovaries. We will also provide age appropriate advice regarding health maintenance, like when you should have certain vaccines, screening for sexually transmitted diseases, bone density testing, colonoscopy, mammograms, etc.   MAMMOGRAMS:  All women over 53 years old should have a routine mammogram.   COLON CANCER SCREENING: Now recommend starting at age 57. At this time colonoscopy is not covered for routine screening until 50. There are take home tests that can be done between 45-49.   COLONOSCOPY:  Colonoscopy to screen for colon cancer is recommended for all women at age 59.  We know, you hate the idea of the prep.  We agree, BUT, having colon cancer and not knowing it is worse!!  Colon cancer so often starts as a polyp that can be seen and removed at colonscopy, which can quite literally save your life!  And if your first colonoscopy is normal and you have no family history of colon cancer, most women don't have to have it again for 10 years.  Once every ten years, you can do something that may end up saving your life, right?  We will be happy to help you get it scheduled when you are ready.  Be sure to check your insurance coverage so you understand how much it will cost.  It may be covered as a preventative service at no cost, but you should check your particular policy.      Breast Self-Awareness Breast self-awareness means being familiar with how your breasts look and feel. It involves checking your breasts regularly and reporting any changes to your health care provider. Practicing breast self-awareness is important. A change in your breasts can be a sign of a serious medical problem. Being familiar with how your breasts look and  feel allows you to find any problems early, when treatment is more likely to be successful. All women should practice breast self-awareness, including women who have had breast implants. How to do a breast self-exam One way to learn what is normal for your breasts and whether your breasts are changing is to do a breast self-exam. To do a breast self-exam: Look for Changes  1. Remove all the clothing above your waist. 2. Stand in front of a mirror in a room with good lighting. 3. Put your hands on your hips. 4. Push your hands firmly downward. 5. Compare your breasts in the mirror. Look for differences between them (asymmetry), such as: ? Differences in shape. ? Differences in size. ? Puckers, dips, and bumps in one breast and not the other. 6. Look at  each breast for changes in your skin, such as: ? Redness. ? Scaly areas. 7. Look for changes in your nipples, such as: ? Discharge. ? Bleeding. ? Dimpling. ? Redness. ? A change in position. Feel for Changes Carefully feel your breasts for lumps and changes. It is best to do this while lying on your back on the floor and again while sitting or standing in the shower or tub with soapy water on your skin. Feel each breast in the following way:  Place the arm on the side of the breast you are examining above your head.  Feel your breast with the other hand.  Start in the nipple area and make  inch (2 cm) overlapping circles to feel your breast. Use the pads of your three middle fingers to do this. Apply light pressure, then medium pressure, then firm pressure. The light pressure will allow you to feel the tissue closest to the skin. The medium pressure will allow you to feel the tissue that is a little deeper. The firm pressure will allow you to feel the tissue close to the ribs.  Continue the overlapping circles, moving downward over the breast until you feel your ribs below your breast.  Move one finger-width toward the center of the  body. Continue to use the  inch (2 cm) overlapping circles to feel your breast as you move slowly up toward your collarbone.  Continue the up and down exam using all three pressures until you reach your armpit.  Write Down What You Find  Write down what is normal for each breast and any changes that you find. Keep a written record with breast changes or normal findings for each breast. By writing this information down, you do not need to depend only on memory for size, tenderness, or location. Write down where you are in your menstrual cycle, if you are still menstruating. If you are having trouble noticing differences in your breasts, do not get discouraged. With time you will become more familiar with the variations in your breasts and more comfortable with the exam. How often should I examine my breasts? Examine your breasts every month. If you are breastfeeding, the best time to examine your breasts is after a feeding or after using a breast pump. If you menstruate, the best time to examine your breasts is 5-7 days after your period is over. During your period, your breasts are lumpier, and it may be more difficult to notice changes. When should I see my health care provider? See your health care provider if you notice:  A change in shape or size of your breasts or nipples.  A change in the skin of your breast or nipples, such as a reddened or scaly area.  Unusual discharge from your nipples.  A lump or thick area that was not there before.  Pain in your breasts.  Anything that concerns you.

## 2021-03-04 LAB — URINALYSIS, COMPLETE W/RFL CULTURE
Bacteria, UA: NONE SEEN /HPF
Bilirubin Urine: NEGATIVE
Glucose, UA: NEGATIVE
Hgb urine dipstick: NEGATIVE
Hyaline Cast: NONE SEEN /LPF
Ketones, ur: NEGATIVE
Nitrites, Initial: NEGATIVE
Protein, ur: NEGATIVE
RBC / HPF: NONE SEEN /HPF (ref 0–2)
Specific Gravity, Urine: 1.015 (ref 1.001–1.03)
pH: 6 (ref 5.0–8.0)

## 2021-03-04 LAB — URINE CULTURE
MICRO NUMBER:: 11677000
SPECIMEN QUALITY:: ADEQUATE

## 2021-03-04 LAB — CULTURE INDICATED

## 2021-03-11 DIAGNOSIS — I10 Essential (primary) hypertension: Secondary | ICD-10-CM | POA: Diagnosis not present

## 2021-03-11 DIAGNOSIS — E782 Mixed hyperlipidemia: Secondary | ICD-10-CM | POA: Diagnosis not present

## 2021-03-11 DIAGNOSIS — E559 Vitamin D deficiency, unspecified: Secondary | ICD-10-CM | POA: Diagnosis not present

## 2021-04-06 DIAGNOSIS — Z23 Encounter for immunization: Secondary | ICD-10-CM | POA: Diagnosis not present

## 2021-04-23 ENCOUNTER — Other Ambulatory Visit: Payer: Self-pay

## 2021-04-23 ENCOUNTER — Encounter: Payer: Self-pay | Admitting: Cardiology

## 2021-04-23 ENCOUNTER — Ambulatory Visit (INDEPENDENT_AMBULATORY_CARE_PROVIDER_SITE_OTHER): Payer: Medicare Other | Admitting: Cardiology

## 2021-04-23 VITALS — BP 128/2 | HR 94 | Ht 63.0 in | Wt 178.6 lb

## 2021-04-23 DIAGNOSIS — M9903 Segmental and somatic dysfunction of lumbar region: Secondary | ICD-10-CM | POA: Diagnosis not present

## 2021-04-23 DIAGNOSIS — I480 Paroxysmal atrial fibrillation: Secondary | ICD-10-CM

## 2021-04-23 DIAGNOSIS — I10 Essential (primary) hypertension: Secondary | ICD-10-CM

## 2021-04-23 DIAGNOSIS — I471 Supraventricular tachycardia: Secondary | ICD-10-CM | POA: Diagnosis not present

## 2021-04-23 DIAGNOSIS — M546 Pain in thoracic spine: Secondary | ICD-10-CM | POA: Diagnosis not present

## 2021-04-23 DIAGNOSIS — M9902 Segmental and somatic dysfunction of thoracic region: Secondary | ICD-10-CM | POA: Diagnosis not present

## 2021-04-23 DIAGNOSIS — M5442 Lumbago with sciatica, left side: Secondary | ICD-10-CM | POA: Diagnosis not present

## 2021-04-23 DIAGNOSIS — E782 Mixed hyperlipidemia: Secondary | ICD-10-CM | POA: Diagnosis not present

## 2021-04-23 DIAGNOSIS — I2510 Atherosclerotic heart disease of native coronary artery without angina pectoris: Secondary | ICD-10-CM | POA: Diagnosis not present

## 2021-04-23 DIAGNOSIS — M5136 Other intervertebral disc degeneration, lumbar region: Secondary | ICD-10-CM | POA: Diagnosis not present

## 2021-04-23 NOTE — Patient Instructions (Signed)

## 2021-04-23 NOTE — Progress Notes (Signed)
Cardiology Office Note:    Date:  04/23/2021   ID:  Ashley Mcdonald, DOB 13-Feb-1951, MRN [id number]  PCP:  Ronita Hipps, MD  Cardiologist:  Jenean Lindau, MD   Referring MD: Ronita Hipps, MD    ASSESSMENT:    1. PAF (paroxysmal atrial fibrillation) ([place])   2. Supraventricular tachycardia ([place])   3. Essential hypertension   4. Mixed hyperlipidemia   5. Coronary artery disease involving native coronary artery of native heart without angina pectoris   6. Mixed dyslipidemia    PLAN:    In order of problems listed above:  1. Coronary artery disease: Secondary prevention stressed with the patient.  Importance of compliance with diet medication stressed and she vocalized understanding.  She was advised to walk at least half an hour a day 5 days a week and she promises to do so. 2. Essential hypertension: Blood pressure stable and diet was emphasized. 3. Paroxysmal atrial fibrillation:I discussed with the patient atrial fibrillation, disease process. Management and therapy including rate and rhythm control, anticoagulation benefits and potential risks were discussed extensively with the patient. Patient had multiple questions which were answered to patient's satisfaction. 4. Mixed dyslipidemia: Diet was emphasized.  Lipids were reviewed from last evaluation.  She will have it checked tomorrow morning and she will come back fasting. 5. Patient will be seen in follow-up appointment in 6 months or earlier if the patient has any concerns    Medication Adjustments/Labs and Tests Ordered: Current medicines are reviewed at length with the patient today.  Concerns regarding medicines are outlined above.  No orders of the defined types were placed in this encounter.  No orders of the defined types were placed in this encounter.    No chief complaint on file.    History of Present Illness:    Ashley Mcdonald is a 70 y.o. female.  Patient has past medical history of paroxysmal atrial  fibrillation, SVT, essential hypertension, coronary artery disease dyslipidemia.  She denies any problems at this time and takes care of activities of daily living.  No chest pain orthopnea or PND.  At the time of my evaluation, the patient is alert awake oriented and in no distress.  Past Medical History:  Diagnosis Date  . Arthritis   . Arthritis of left hip 03/22/2016  . CAD (coronary artery disease) 09/16/2017  . 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 Complication of anesthesia   . Coronary artery disease   . Cystic breast   . Essential hypertension 08/01/2017  . Heart murmur    " nothing to be concerned about"  . Hyperlipidemia   . Hypertension   . Mixed dyslipidemia 04/09/2020  . Norovirus   . PAF (paroxysmal atrial fibrillation) ([place]) 02/14/2018  . Plantar fasciitis 2006   right  . PONV (postoperative nausea and vomiting)   . Primary osteoarthritis of first carpometacarpal joint of right hand 11/28/2015  . Primary osteoarthritis of left hip 03/20/2016  . Puncture wound    right leg  . Shortness of breath dyspnea    with exertion  . 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  . Supraventricular tachycardia ([place]) 08/01/2017  . Tachycardia   . Trigger finger of right thumb 11/28/2015    Past Surgical History:  Procedure Laterality Date  . COLONOSCOPY    . HYSTEROSCOPY     D&C polypectomy  . LEFT HEART CATH AND CORONARY ANGIOGRAPHY N/A 08/23/2017   Procedure: LEFT HEART CATH AND CORONARY ANGIOGRAPHY;  Surgeon: Daneen Schick  W, MD;  Location: [facility];  Service: Cardiovascular;  Laterality: N/A;  . SPINAL FUSION  2008   c5-7  . TOTAL HIP ARTHROPLASTY Left 03/22/2016   Procedure: TOTAL HIP ARTHROPLASTY ANTERIOR APPROACH;  Surgeon: Frederik Pear, MD;  Location: [facility];  Service: Orthopedics;  Laterality: Left;    Current Medications: Current Meds  Medication Sig  . acetaminophen (TYLENOL) 650 MG CR tablet Take 650 mg by mouth at bedtime as needed for pain.  Marland Kitchen apixaban  (ELIQUIS) 5 MG TABS tablet Take 1 tablet (5 mg total) by mouth 2 (two) times daily.  Marland Kitchen atorvastatin (LIPITOR) 10 MG tablet Take 1 tablet (10 mg total) by mouth daily.  . Calcium Carbonate-Vitamin D (CALCIUM-D PO) Take 2 tablets by mouth every evening.  . cyclobenzaprine (FLEXERIL) 5 MG tablet Take 5 mg by mouth daily as needed for muscle spasms.  Marland Kitchen doxepin (SINEQUAN) 10 MG capsule Take 20 mg by mouth as needed (depression).  . Estradiol 10 MCG TABS vaginal tablet Place 1 tablet vaginally 2 (two) times a week.  . flecainide (TAMBOCOR) 50 MG tablet Take 1 tablet (50 mg total) by mouth 2 (two) times daily.  . metoprolol succinate (TOPROL-XL) 50 MG 24 hr tablet Take 1 tablet by mouth daily.  Marland Kitchen telmisartan-hydrochlorothiazide (MICARDIS HCT) 40-12.5 MG tablet Take 1 tablet by mouth every morning.      Allergies:   Nsaids   Social History   Socioeconomic History  . Marital status: Married    Spouse name: Not on file  . Number of children: Not on file  . Years of education: Not on file  . Highest education level: Not on file  Occupational History  . Not on file  Tobacco Use  . Smoking status: Never Smoker  . Smokeless tobacco: Never Used  Vaping Use  . Vaping Use: Never used  Substance and Sexual Activity  . Alcohol use: No    Alcohol/week: 0.0 standard drinks  . Drug use: No  . Sexual activity: Not Currently    Partners: Male    Birth control/protection: Post-menopausal  Other Topics Concern  . Not on file  Social History Narrative  . Not on file   Social Determinants of Health   Financial Resource Strain: Not on file  Food Insecurity: Not on file  Transportation Needs: Not on file  Physical Activity: Not on file  Stress: Not on file  Social Connections: Not on file     Family History: The patient's family history includes Cancer in her maternal grandmother; Diabetes in her paternal grandmother; Heart attack in her father; Stroke in her mother.  ROS:   Please see the  history of present illness.    All other systems reviewed and are negative.  EKGs/Labs/Other Studies Reviewed:    The following studies were reviewed today: EKG reveals sinus rhythm and nonspecific ST-T changes and normal QRS.   Recent Labs: No results found for requested labs within last 8760 hours.  Recent Lipid Panel    Component Value Date/Time   CHOL 117 04/17/2020 0814   TRIG 91 04/17/2020 0814   HDL 34 (L) 04/17/2020 0814   CHOLHDL 3.4 04/17/2020 0814   LDLCALC 65 04/17/2020 0814    Physical Exam:    VS:  BP (!) 128/2   Pulse 94   Ht 5\' 3"  (1.6 m)   Wt 178 lb 9.6 oz (81 kg)   LMP 09/12/2002   SpO2 96%   BMI 31.64 kg/m  Wt Readings from Last 3 Encounters:  04/23/21 178 lb 9.6 oz (81 kg)  03/03/21 177 lb 12.8 oz (80.6 kg)  10/09/20 178 lb 9.6 oz (81 kg)     GEN: Patient is in no acute distress HEENT: Normal NECK: No JVD; No carotid bruits LYMPHATICS: No lymphadenopathy CARDIAC: Hear sounds regular, 2/6 systolic murmur at the apex. RESPIRATORY:  Clear to auscultation without rales, wheezing or rhonchi  ABDOMEN: Soft, non-tender, non-distended MUSCULOSKELETAL:  No edema; No deformity  SKIN: Warm and dry NEUROLOGIC:  Alert and oriented x 3 PSYCHIATRIC:  Normal affect   Signed, Jenean Lindau, MD  04/23/2021 3:41 PM    [facility]

## 2021-04-24 DIAGNOSIS — I480 Paroxysmal atrial fibrillation: Secondary | ICD-10-CM | POA: Diagnosis not present

## 2021-04-24 DIAGNOSIS — M9903 Segmental and somatic dysfunction of lumbar region: Secondary | ICD-10-CM | POA: Diagnosis not present

## 2021-04-24 DIAGNOSIS — M5136 Other intervertebral disc degeneration, lumbar region: Secondary | ICD-10-CM | POA: Diagnosis not present

## 2021-04-24 DIAGNOSIS — I10 Essential (primary) hypertension: Secondary | ICD-10-CM | POA: Diagnosis not present

## 2021-04-24 DIAGNOSIS — M546 Pain in thoracic spine: Secondary | ICD-10-CM | POA: Diagnosis not present

## 2021-04-24 DIAGNOSIS — I2510 Atherosclerotic heart disease of native coronary artery without angina pectoris: Secondary | ICD-10-CM | POA: Diagnosis not present

## 2021-04-24 DIAGNOSIS — M5442 Lumbago with sciatica, left side: Secondary | ICD-10-CM | POA: Diagnosis not present

## 2021-04-24 DIAGNOSIS — M9902 Segmental and somatic dysfunction of thoracic region: Secondary | ICD-10-CM | POA: Diagnosis not present

## 2021-04-24 DIAGNOSIS — E782 Mixed hyperlipidemia: Secondary | ICD-10-CM | POA: Diagnosis not present

## 2021-04-25 LAB — LIPID PANEL
Chol/HDL Ratio: 3.6 ratio (ref 0.0–4.4)
Cholesterol, Total: 116 mg/dL (ref 100–199)
HDL: 32 mg/dL — ABNORMAL LOW (ref >39)
LDL Chol Calc (NIH): 56 mg/dL (ref 0–99)
Triglycerides: 163 mg/dL — ABNORMAL HIGH (ref 0–149)
VLDL Cholesterol Cal: 28 mg/dL (ref 5–40)

## 2021-04-25 LAB — HEPATIC FUNCTION PANEL
ALT: 23 IU/L (ref 0–32)
AST: 21 IU/L (ref 0–40)
Albumin: 4.3 g/dL (ref 3.8–4.8)
Alkaline Phosphatase: 85 IU/L (ref 44–121)
Bilirubin Total: 0.3 mg/dL (ref 0.0–1.2)
Bilirubin, Direct: 0.11 mg/dL (ref 0.00–0.40)
Total Protein: 6.6 g/dL (ref 6.0–8.5)

## 2021-04-25 LAB — CBC WITH DIFFERENTIAL/PLATELET
Basophils Absolute: 0 10*3/uL (ref 0.0–0.2)
Basos: 1 %
EOS (ABSOLUTE): 0.1 10*3/uL (ref 0.0–0.4)
Eos: 2 %
Hematocrit: 39.6 % (ref 34.0–46.6)
Hemoglobin: 13.3 g/dL (ref 11.1–15.9)
Immature Grans (Abs): 0 10*3/uL (ref 0.0–0.1)
Immature Granulocytes: 1 %
Lymphocytes Absolute: 1.5 10*3/uL (ref 0.7–3.1)
Lymphs: 36 %
MCH: 28.1 pg (ref 26.6–33.0)
MCHC: 33.6 g/dL (ref 31.5–35.7)
MCV: 84 fL (ref 79–97)
Monocytes Absolute: 0.5 10*3/uL (ref 0.1–0.9)
Monocytes: 13 %
Neutrophils Absolute: 2.1 10*3/uL (ref 1.4–7.0)
Neutrophils: 47 %
Platelets: 186 10*3/uL (ref 150–450)
RBC: 4.74 x10E6/uL (ref 3.77–5.28)
RDW: 13 % (ref 11.7–15.4)
WBC: 4.2 10*3/uL (ref 3.4–10.8)

## 2021-04-25 LAB — BASIC METABOLIC PANEL
BUN/Creatinine Ratio: 11 — ABNORMAL LOW (ref 12–28)
BUN: 10 mg/dL (ref 8–27)
CO2: 24 mmol/L (ref 20–29)
Calcium: 9.3 mg/dL (ref 8.7–10.3)
Chloride: 102 mmol/L (ref 96–106)
Creatinine, Ser: 0.87 mg/dL (ref 0.57–1.00)
Glucose: 92 mg/dL (ref 65–99)
Potassium: 3.9 mmol/L (ref 3.5–5.2)
Sodium: 142 mmol/L (ref 134–144)
eGFR: 72 mL/min/{1.73_m2} (ref >59)

## 2021-04-25 LAB — TSH: TSH: 4.24 u[IU]/mL (ref 0.450–4.500)

## 2021-05-11 DIAGNOSIS — E782 Mixed hyperlipidemia: Secondary | ICD-10-CM | POA: Diagnosis not present

## 2021-05-11 DIAGNOSIS — I10 Essential (primary) hypertension: Secondary | ICD-10-CM | POA: Diagnosis not present

## 2021-05-11 DIAGNOSIS — E559 Vitamin D deficiency, unspecified: Secondary | ICD-10-CM | POA: Diagnosis not present

## 2021-05-21 DIAGNOSIS — M9902 Segmental and somatic dysfunction of thoracic region: Secondary | ICD-10-CM | POA: Diagnosis not present

## 2021-05-21 DIAGNOSIS — M5442 Lumbago with sciatica, left side: Secondary | ICD-10-CM | POA: Diagnosis not present

## 2021-05-21 DIAGNOSIS — M546 Pain in thoracic spine: Secondary | ICD-10-CM | POA: Diagnosis not present

## 2021-05-21 DIAGNOSIS — M9903 Segmental and somatic dysfunction of lumbar region: Secondary | ICD-10-CM | POA: Diagnosis not present

## 2021-05-21 DIAGNOSIS — M5136 Other intervertebral disc degeneration, lumbar region: Secondary | ICD-10-CM | POA: Diagnosis not present

## 2021-06-11 DIAGNOSIS — I4891 Unspecified atrial fibrillation: Secondary | ICD-10-CM | POA: Diagnosis not present

## 2021-06-11 DIAGNOSIS — I10 Essential (primary) hypertension: Secondary | ICD-10-CM | POA: Diagnosis not present

## 2021-06-11 DIAGNOSIS — E7849 Other hyperlipidemia: Secondary | ICD-10-CM | POA: Diagnosis not present

## 2021-06-25 DIAGNOSIS — M5442 Lumbago with sciatica, left side: Secondary | ICD-10-CM | POA: Diagnosis not present

## 2021-06-25 DIAGNOSIS — M9903 Segmental and somatic dysfunction of lumbar region: Secondary | ICD-10-CM | POA: Diagnosis not present

## 2021-06-25 DIAGNOSIS — M9902 Segmental and somatic dysfunction of thoracic region: Secondary | ICD-10-CM | POA: Diagnosis not present

## 2021-06-25 DIAGNOSIS — M546 Pain in thoracic spine: Secondary | ICD-10-CM | POA: Diagnosis not present

## 2021-06-25 DIAGNOSIS — M5136 Other intervertebral disc degeneration, lumbar region: Secondary | ICD-10-CM | POA: Diagnosis not present

## 2021-07-06 DIAGNOSIS — M5136 Other intervertebral disc degeneration, lumbar region: Secondary | ICD-10-CM | POA: Diagnosis not present

## 2021-07-06 DIAGNOSIS — M9902 Segmental and somatic dysfunction of thoracic region: Secondary | ICD-10-CM | POA: Diagnosis not present

## 2021-07-06 DIAGNOSIS — M5442 Lumbago with sciatica, left side: Secondary | ICD-10-CM | POA: Diagnosis not present

## 2021-07-06 DIAGNOSIS — M9903 Segmental and somatic dysfunction of lumbar region: Secondary | ICD-10-CM | POA: Diagnosis not present

## 2021-07-06 DIAGNOSIS — M546 Pain in thoracic spine: Secondary | ICD-10-CM | POA: Diagnosis not present

## 2021-07-10 ENCOUNTER — Other Ambulatory Visit: Payer: Self-pay | Admitting: Cardiology

## 2021-08-06 DIAGNOSIS — M546 Pain in thoracic spine: Secondary | ICD-10-CM | POA: Diagnosis not present

## 2021-08-06 DIAGNOSIS — M9902 Segmental and somatic dysfunction of thoracic region: Secondary | ICD-10-CM | POA: Diagnosis not present

## 2021-08-06 DIAGNOSIS — M9903 Segmental and somatic dysfunction of lumbar region: Secondary | ICD-10-CM | POA: Diagnosis not present

## 2021-08-06 DIAGNOSIS — M5442 Lumbago with sciatica, left side: Secondary | ICD-10-CM | POA: Diagnosis not present

## 2021-08-06 DIAGNOSIS — M5136 Other intervertebral disc degeneration, lumbar region: Secondary | ICD-10-CM | POA: Diagnosis not present

## 2021-08-12 DIAGNOSIS — E559 Vitamin D deficiency, unspecified: Secondary | ICD-10-CM | POA: Diagnosis not present

## 2021-08-12 DIAGNOSIS — E782 Mixed hyperlipidemia: Secondary | ICD-10-CM | POA: Diagnosis not present

## 2021-08-12 DIAGNOSIS — I10 Essential (primary) hypertension: Secondary | ICD-10-CM | POA: Diagnosis not present

## 2021-08-19 ENCOUNTER — Other Ambulatory Visit: Payer: Self-pay

## 2021-08-19 ENCOUNTER — Ambulatory Visit (INDEPENDENT_AMBULATORY_CARE_PROVIDER_SITE_OTHER): Payer: Medicare Other | Admitting: Obstetrics and Gynecology

## 2021-08-19 ENCOUNTER — Encounter: Payer: Self-pay | Admitting: Obstetrics and Gynecology

## 2021-08-19 VITALS — BP 130/80 | HR 88 | Ht 63.0 in | Wt 180.0 lb

## 2021-08-19 DIAGNOSIS — N3090 Cystitis, unspecified without hematuria: Secondary | ICD-10-CM | POA: Diagnosis not present

## 2021-08-19 MED ORDER — SULFAMETHOXAZOLE-TRIMETHOPRIM 800-160 MG PO TABS: 1.0000 | ORAL_TABLET | Freq: Two times a day (BID) | ORAL | 0 refills | Status: DC

## 2021-08-19 NOTE — Patient Instructions (Signed)
Urinary Tract Infection, Adult A urinary tract infection (UTI) is an infection of any part of the urinary tract. The urinary tract includes the kidneys, ureters, bladder, and urethra. These organs make, store, and get rid of urine in the body. An upper UTI affects the ureters and kidneys. A lower UTI affects the bladder and urethra. What are the causes? Most urinary tract infections are caused by bacteria in your genital area around your urethra, where urine leaves your body. These bacteria grow and cause inflammation of your urinary tract. What increases the risk? You are more likely to develop this condition if: You have a urinary catheter that stays in place. You are not able to control when you urinate or have a bowel movement (incontinence). You are female and you: Use a spermicide or diaphragm for birth control. Have low estrogen levels. Are pregnant. You have certain genes that increase your risk. You are sexually active. You take antibiotic medicines. You have a condition that causes your flow of urine to slow down, such as: An enlarged prostate, if you are female. Blockage in your urethra. A kidney stone. A nerve condition that affects your bladder control (neurogenic bladder). Not getting enough to drink, or not urinating often. You have certain medical conditions, such as: Diabetes. A weak disease-fighting system (immunesystem). Sickle cell disease. Gout. Spinal cord injury. What are the signs or symptoms? Symptoms of this condition include: Needing to urinate right away (urgency). Frequent urination. This may include small amounts of urine each time you urinate. Pain or burning with urination. Blood in the urine. Urine that smells bad or unusual. Trouble urinating. Cloudy urine. Vaginal discharge, if you are female. Pain in the abdomen or the lower back. You may also have: Vomiting or a decreased appetite. Confusion. Irritability or tiredness. A fever or  chills. Diarrhea. The first symptom in older adults may be confusion. In some cases, they may not have any symptoms until the infection has worsened. How is this diagnosed? This condition is diagnosed based on your medical history and a physical exam. You may also have other tests, including: Urine tests. Blood tests. Tests for STIs (sexually transmitted infections). If you have had more than one UTI, a cystoscopy or imaging studies may be done to determine the cause of the infections. How is this treated? Treatment for this condition includes: Antibiotic medicine. Over-the-counter medicines to treat discomfort. Drinking enough water to stay hydrated. If you have frequent infections or have other conditions such as a kidney stone, you may need to see a health care provider who specializes in the urinary tract (urologist). In rare cases, urinary tract infections can cause sepsis. Sepsis is a life-threatening condition that occurs when the body responds to an infection. Sepsis is treated in the hospital with IV antibiotics, fluids, and other medicines. Follow these instructions at home: Medicines Take over-the-counter and prescription medicines only as told by your health care provider. If you were prescribed an antibiotic medicine, take it as told by your health care provider. Do not stop using the antibiotic even if you start to feel better. General instructions Make sure you: Empty your bladder often and completely. Do not hold urine for long periods of time. Empty your bladder after sex. Wipe from front to back after urinating or having a bowel movement if you are female. Use each tissue only one time when you wipe. Drink enough fluid to keep your urine pale yellow. Keep all follow-up visits. This is important. Contact a health care provider   if: Your symptoms do not get better after 1-2 days. Your symptoms go away and then return. Get help right away if: You have severe pain in your  back or your lower abdomen. You have a fever or chills. You have nausea or vomiting. Summary A urinary tract infection (UTI) is an infection of any part of the urinary tract, which includes the kidneys, ureters, bladder, and urethra. Most urinary tract infections are caused by bacteria in your genital area. Treatment for this condition often includes antibiotic medicines. If you were prescribed an antibiotic medicine, take it as told by your health care provider. Do not stop using the antibiotic even if you start to feel better. Keep all follow-up visits. This is important. This information is not intended to replace advice given to you by your health care provider. Make sure you discuss any questions you have with your health care provider. Document Revised: 07/11/2020 Document Reviewed: 07/11/2020 Elsevier Patient Education  2022 Elsevier Inc.  

## 2021-08-19 NOTE — Progress Notes (Signed)
GYNECOLOGY  VISIT   HPI: 70 y.o.   Married White or Caucasian Not Hispanic or Latino  female   [id number] with Patient's last menstrual period was 09/12/2002.   here for lower abdominal pressure, dysuria.  Patient states that she started with symptoms yesterday.  Yesterday afternoon she had a strong urge to urinate, there wasn't a lot of urine when she voided. She had a little dysuria yesterday.  Started AZO last night. Prior to taking the AZO she had some SP discomfort.  She felt some mild chills yesterday afternoon. She didn't check her temp. Doesn't feel like she has a fever.   GYNECOLOGIC HISTORY: Patient's last menstrual period was 09/12/2002. Contraception:pmp  Menopausal hormone therapy: none         OB History     Gravida  1   Para  0   Term  0   Preterm  0   AB  1   Living  0      SAB      IAB      Ectopic      Multiple      Live Births                 Patient Active Problem List   Diagnosis Date Noted   Arthritis    Complication of anesthesia    Coronary artery disease    Cystic breast    Heart murmur    Hyperlipidemia    Hypertension    Norovirus    PONV (postoperative nausea and vomiting)    Puncture wound    Tachycardia    Mixed dyslipidemia 04/09/2020   PAF (paroxysmal atrial fibrillation) ([place]) 02/14/2018   CAD (coronary artery disease) 09/16/2017   Supraventricular tachycardia ([place]) 08/01/2017   Essential hypertension 08/01/2017   Arthritis of left hip 03/22/2016   Primary osteoarthritis of left hip 03/20/2016   CMC arthritis, thumb, degenerative 11/28/2015   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   Primary osteoarthritis of first carpometacarpal joint of right hand 11/28/2015   Trigger finger of right thumb 11/28/2015   Plantar fasciitis 2006    Past Medical History:  Diagnosis Date   Arthritis    Arthritis of left hip 03/22/2016   CAD (coronary artery disease) 09/16/2017   CMC arthritis, thumb, degenerative 0000000    Complication of anesthesia    Coronary artery disease    Cystic breast    Essential hypertension 08/01/2017   Heart murmur    " nothing to be concerned about"   Hyperlipidemia    Hypertension    Mixed dyslipidemia 04/09/2020   Norovirus    PAF (paroxysmal atrial fibrillation) ([place]) 02/14/2018   Plantar fasciitis 2006   right   PONV (postoperative nausea and vomiting)    Primary osteoarthritis of first carpometacarpal joint of right hand 11/28/2015   Primary osteoarthritis of left hip 03/20/2016   Puncture wound    right leg   Shortness of breath dyspnea    with exertion   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   Supraventricular tachycardia ([place]) 08/01/2017   Tachycardia    Trigger finger of right thumb 11/28/2015    Past Surgical History:  Procedure Laterality Date   COLONOSCOPY     HYSTEROSCOPY     D&C polypectomy   LEFT HEART CATH AND CORONARY ANGIOGRAPHY N/A 08/23/2017   Procedure: LEFT HEART CATH AND CORONARY ANGIOGRAPHY;  Surgeon: Belva Crome, MD;  Location: [facility];  Service: Cardiovascular;  Laterality: N/A;   SPINAL FUSION  2008   c5-7   TOTAL HIP ARTHROPLASTY Left 03/22/2016   Procedure: TOTAL HIP ARTHROPLASTY ANTERIOR APPROACH;  Surgeon: Frederik Pear, MD;  Location: [facility];  Service: Orthopedics;  Laterality: Left;    Current Outpatient Medications  Medication Sig Dispense Refill   acetaminophen (TYLENOL) 650 MG CR tablet Take 650 mg by mouth at bedtime as needed for pain.     atorvastatin (LIPITOR) 10 MG tablet Take 1 tablet (10 mg total) by mouth daily. 90 tablet 2   Calcium Carbonate-Vitamin D (CALCIUM-D PO) Take 2 tablets by mouth every evening.     cyclobenzaprine (FLEXERIL) 5 MG tablet Take 5 mg by mouth daily as needed for muscle spasms.     doxepin (SINEQUAN) 10 MG capsule Take 20 mg by mouth as needed (depression).     ELIQUIS 5 MG TABS tablet TAKE ONE TABLET BY MOUTH AT BREAKFAST AND AT BEDTIME 180 tablet 1   Estradiol 10 MCG TABS vaginal tablet  Place 1 tablet vaginally 2 (two) times a week.     flecainide (TAMBOCOR) 50 MG tablet Take 1 tablet (50 mg total) by mouth 2 (two) times daily. 180 tablet 3   metoprolol succinate (TOPROL-XL) 50 MG 24 hr tablet Take 1 tablet by mouth daily.     telmisartan-hydrochlorothiazide (MICARDIS HCT) 40-12.5 MG tablet Take 1 tablet by mouth every morning.   3   No current facility-administered medications for this visit.     ALLERGIES: Nsaids  Family History  Problem Relation Age of Onset   Stroke Mother    Heart attack Father    Cancer Maternal Grandmother        uterine?   Diabetes Paternal Grandmother     Social History   Socioeconomic History   Marital status: Married    Spouse name: Not on file   Number of children: Not on file   Years of education: Not on file   Highest education level: Not on file  Occupational History   Not on file  Tobacco Use   Smoking status: Never   Smokeless tobacco: Never  Vaping Use   Vaping Use: Never used  Substance and Sexual Activity   Alcohol use: No    Alcohol/week: 0.0 standard drinks   Drug use: No   Sexual activity: Not Currently    Partners: Male    Birth control/protection: Post-menopausal  Other Topics Concern   Not on file  Social History Narrative   Not on file   Social Determinants of Health   Financial Resource Strain: Not on file  Food Insecurity: Not on file  Transportation Needs: Not on file  Physical Activity: Not on file  Stress: Not on file  Social Connections: Not on file  Intimate Partner Violence: Not on file    Review of Systems  Genitourinary:  Positive for dysuria, frequency and urgency.   PHYSICAL EXAMINATION:    BP 130/80   Pulse 88   Ht '5\' 3"'$  (1.6 m)   Wt 180 lb (81.6 kg)   LMP 09/12/2002   SpO2 100%   BMI 31.89 kg/m     General appearance: alert, cooperative and appears stated age CVA: not tender Abdomen: soft, non-tender; non distended, no masses,  no organomegaly  1. Cystitis Symptoms  classic for UTI - sulfamethoxazole-trimethoprim (BACTRIM DS) 800-160 MG tablet; Take 1 tablet by mouth 2 (two) times daily. One PO BID x 3 days  Dispense: 6 tablet; Refill: 0 - Urinalysis,Complete w/RFL Culture

## 2021-08-21 LAB — URINE CULTURE
MICRO NUMBER:: 12341359
SPECIMEN QUALITY:: ADEQUATE

## 2021-08-21 LAB — URINALYSIS, COMPLETE W/RFL CULTURE
Casts: NONE SEEN /LPF
Crystals: NONE SEEN /HPF
Hyaline Cast: NONE SEEN /LPF
RBC / HPF: NONE SEEN /HPF (ref 0–2)
Yeast: NONE SEEN /HPF

## 2021-08-21 LAB — CULTURE INDICATED

## 2021-09-11 DIAGNOSIS — Z23 Encounter for immunization: Secondary | ICD-10-CM | POA: Diagnosis not present

## 2021-10-06 DIAGNOSIS — Z780 Asymptomatic menopausal state: Secondary | ICD-10-CM | POA: Diagnosis not present

## 2021-10-06 DIAGNOSIS — M85851 Other specified disorders of bone density and structure, right thigh: Secondary | ICD-10-CM | POA: Diagnosis not present

## 2021-10-06 DIAGNOSIS — Z1231 Encounter for screening mammogram for malignant neoplasm of breast: Secondary | ICD-10-CM | POA: Diagnosis not present

## 2021-10-07 ENCOUNTER — Encounter (HOSPITAL_BASED_OUTPATIENT_CLINIC_OR_DEPARTMENT_OTHER): Payer: Self-pay | Admitting: Obstetrics & Gynecology

## 2021-10-07 ENCOUNTER — Encounter: Payer: Self-pay | Admitting: Obstetrics and Gynecology

## 2021-10-09 ENCOUNTER — Other Ambulatory Visit: Payer: Self-pay | Admitting: Cardiology

## 2021-10-09 DIAGNOSIS — Z23 Encounter for immunization: Secondary | ICD-10-CM | POA: Diagnosis not present

## 2021-10-15 DIAGNOSIS — M5136 Other intervertebral disc degeneration, lumbar region: Secondary | ICD-10-CM | POA: Diagnosis not present

## 2021-10-15 DIAGNOSIS — M5442 Lumbago with sciatica, left side: Secondary | ICD-10-CM | POA: Diagnosis not present

## 2021-10-15 DIAGNOSIS — M546 Pain in thoracic spine: Secondary | ICD-10-CM | POA: Diagnosis not present

## 2021-10-15 DIAGNOSIS — M9902 Segmental and somatic dysfunction of thoracic region: Secondary | ICD-10-CM | POA: Diagnosis not present

## 2021-10-15 DIAGNOSIS — M9903 Segmental and somatic dysfunction of lumbar region: Secondary | ICD-10-CM | POA: Diagnosis not present

## 2021-11-04 ENCOUNTER — Ambulatory Visit (INDEPENDENT_AMBULATORY_CARE_PROVIDER_SITE_OTHER): Payer: Medicare Other | Admitting: Cardiology

## 2021-11-04 ENCOUNTER — Other Ambulatory Visit: Payer: Self-pay

## 2021-11-04 ENCOUNTER — Encounter: Payer: Self-pay | Admitting: Cardiology

## 2021-11-04 VITALS — BP 112/70 | HR 65 | Ht 63.0 in | Wt 182.0 lb

## 2021-11-04 DIAGNOSIS — I480 Paroxysmal atrial fibrillation: Secondary | ICD-10-CM

## 2021-11-04 DIAGNOSIS — E782 Mixed hyperlipidemia: Secondary | ICD-10-CM

## 2021-11-04 DIAGNOSIS — I471 Supraventricular tachycardia: Secondary | ICD-10-CM

## 2021-11-04 DIAGNOSIS — I2510 Atherosclerotic heart disease of native coronary artery without angina pectoris: Secondary | ICD-10-CM | POA: Diagnosis not present

## 2021-11-04 DIAGNOSIS — I10 Essential (primary) hypertension: Secondary | ICD-10-CM | POA: Diagnosis not present

## 2021-11-04 NOTE — Progress Notes (Signed)
Cardiology Office Note:    Date:  11/04/2021   ID:  Ashley Mcdonald, DOB February 02, 1951, MRN [id number]  PCP:  Ronita Hipps, MD  Cardiologist:  Jenean Lindau, MD   Referring MD: Ronita Hipps, MD    ASSESSMENT:    1. Supraventricular tachycardia ([place])   2. 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 Essential hypertension   4. Mixed hyperlipidemia   5. PAF (paroxysmal atrial fibrillation) ([facility])    PLAN:    In order of problems listed above:  Coronary artery disease: Secondary prevention stressed with the patient.  Importance of compliance with diet medication stressed and she vocalized understanding.  She was advised to walk to the best of her ability given her orthopedic issues. Essential hypertension: Blood pressure stable and diet was emphasized.  Lifestyle modification urged. Mixed dyslipidemia: Lipids were reviewed diet was emphasized.  Weight reduction stressed and she promises to do better. Paroxysmal atrial fibrillation:I discussed with the patient atrial fibrillation, disease process. Management and therapy including rate and rhythm control, anticoagulation benefits and potential risks were discussed extensively with the patient. Patient had multiple questions which were answered to patient's satisfaction. Patient will be seen in follow-up appointment in 6 months or earlier if the patient has any concerns    Medication Adjustments/Labs and Tests Ordered: Current medicines are reviewed at length with the patient today.  Concerns regarding medicines are outlined above.  Orders Placed This Encounter  Procedures   EKG 12-Lead   No orders of the defined types were placed in this encounter.    Chief Complaint  Patient presents with   Follow-up     History of Present Illness:    Ashley Mcdonald is a 70 y.o. female.  Patient has past medical history of coronary artery disease, essential hypertension, mixed  dyslipidemia and paroxysmal atrial fibrillation.  She denies any problems at this time and takes care of activities of daily living.  No chest pain orthopnea or PND.  She hurt her back and is leading a sedentary lifestyle.  No palpitations or any such issues.  At the time of my evaluation, the patient is alert awake oriented and in no distress.  Past Medical History:  Diagnosis Date   Arthritis    Arthritis of left hip 03/22/2016   CAD (coronary artery disease) 09/16/2017   CMC arthritis, thumb, degenerative 09/98/3382   Complication of anesthesia    Coronary artery disease    Cystic breast    Essential hypertension 08/01/2017   Heart murmur    " nothing to be concerned about"   Hyperlipidemia    Hypertension    Mixed dyslipidemia 04/09/2020   Norovirus    PAF (paroxysmal atrial fibrillation) ([place]) 02/14/2018   Plantar fasciitis 2006   right   PONV (postoperative nausea and vomiting)    Primary osteoarthritis of first carpometacarpal joint of right hand 11/28/2015   Primary osteoarthritis of left hip 03/20/2016   Puncture wound    right leg   Shortness of breath dyspnea    with exertion   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   Supraventricular tachycardia ([place]) 08/01/2017   Tachycardia    Trigger finger of right thumb 11/28/2015    Past Surgical History:  Procedure Laterality Date   COLONOSCOPY     HYSTEROSCOPY     D&C polypectomy   LEFT HEART CATH AND CORONARY ANGIOGRAPHY N/A 08/23/2017   Procedure: LEFT HEART CATH AND CORONARY ANGIOGRAPHY;  Surgeon: Belva Crome, MD;  Location: [facility];  Service: Cardiovascular;  Laterality: N/A;   SPINAL FUSION  2008   c5-7   TOTAL HIP ARTHROPLASTY Left 03/22/2016   Procedure: TOTAL HIP ARTHROPLASTY ANTERIOR APPROACH;  Surgeon: Frederik Pear, MD;  Location: [facility];  Service: Orthopedics;  Laterality: Left;    Current Medications: Current Meds  Medication Sig   acetaminophen (TYLENOL) 650 MG CR tablet Take 650 mg by mouth at bedtime  as needed for pain.   atorvastatin (LIPITOR) 10 MG tablet TAKE ONE TABLET BY MOUTH EVERYDAY AT BEDTIME   Calcium Carbonate-Vitamin D (CALCIUM-D PO) Take 2 tablets by mouth every evening.   ELIQUIS 5 MG TABS tablet TAKE ONE TABLET BY MOUTH AT BREAKFAST AND AT BEDTIME   Estradiol 10 MCG TABS vaginal tablet Place 1 tablet vaginally 2 (two) times a week.   flecainide (TAMBOCOR) 50 MG tablet TAKE ONE TABLET BY MOUTH AT BREAKFAST AND AT BEDTIME   metoprolol succinate (TOPROL-XL) 50 MG 24 hr tablet Take 1 tablet by mouth daily.   nitrofurantoin, macrocrystal-monohydrate, (MACROBID) 100 MG capsule Take 100 mg by mouth 2 (two) times daily.   telmisartan-hydrochlorothiazide (MICARDIS HCT) 40-12.5 MG tablet Take 1 tablet by mouth every morning.      Allergies:   Nsaids   Social History   Socioeconomic History   Marital status: Married    Spouse name: Not on file   Number of children: Not on file   Years of education: Not on file   Highest education level: Not on file  Occupational History   Not on file  Tobacco Use   Smoking status: Never   Smokeless tobacco: Never  Vaping Use   Vaping Use: Never used  Substance and Sexual Activity   Alcohol use: No    Alcohol/week: 0.0 standard drinks   Drug use: No   Sexual activity: Not Currently    Partners: Male    Birth control/protection: Post-menopausal  Other Topics Concern   Not on file  Social History Narrative   Not on file   Social Determinants of Health   Financial Resource Strain: Not on file  Food Insecurity: Not on file  Transportation Needs: Not on file  Physical Activity: Not on file  Stress: Not on file  Social Connections: Not on file     Family History: The patient's family history includes Cancer in her maternal grandmother; Diabetes in her paternal grandmother; Heart attack in her father; Stroke in her mother.  ROS:   Please see the history of present illness.    All other systems reviewed and are  negative.  EKGs/Labs/Other Studies Reviewed:    The following studies were reviewed today: EKG reveals sinus rhythm and nonspecific ST-T changes and QRS is unremarkable.   Recent Labs: 04/24/2021: ALT 23; BUN 10; Creatinine, Ser 0.87; Hemoglobin 13.3; Platelets 186; Potassium 3.9; Sodium 142; TSH 4.240  Recent Lipid Panel    Component Value Date/Time   CHOL 116 04/24/2021 0852   TRIG 163 (H) 04/24/2021 0852   HDL 32 (L) 04/24/2021 0852   CHOLHDL 3.6 04/24/2021 0852   LDLCALC 56 04/24/2021 0852    Physical Exam:    VS:  BP 112/70 (BP Location: Right Arm, Patient Position: Sitting, Cuff Size: Normal)   Pulse 65   Ht 5\' 3"  (1.6 m)   Wt 182 lb (82.6 kg)   LMP 09/12/2002   SpO2 94%   BMI 32.24 kg/m     Wt Readings from Last  3 Encounters:  11/04/21 182 lb (82.6 kg)  08/19/21 180 lb (81.6 kg)  04/23/21 178 lb 9.6 oz (81 kg)     GEN: Patient is in no acute distress HEENT: Normal NECK: No JVD; No carotid bruits LYMPHATICS: No lymphadenopathy CARDIAC: Hear sounds regular, 2/6 systolic murmur at the apex. RESPIRATORY:  Clear to auscultation without rales, wheezing or rhonchi  ABDOMEN: Soft, non-tender, non-distended MUSCULOSKELETAL:  No edema; No deformity  SKIN: Warm and dry NEUROLOGIC:  Alert and oriented x 3 PSYCHIATRIC:  Normal affect   Signed, Jenean Lindau, MD  11/04/2021 4:51 PM    [facility]

## 2021-11-04 NOTE — Patient Instructions (Signed)

## 2021-11-11 DIAGNOSIS — E782 Mixed hyperlipidemia: Secondary | ICD-10-CM | POA: Diagnosis not present

## 2021-11-11 DIAGNOSIS — I10 Essential (primary) hypertension: Secondary | ICD-10-CM | POA: Diagnosis not present

## 2021-11-11 DIAGNOSIS — E559 Vitamin D deficiency, unspecified: Secondary | ICD-10-CM | POA: Diagnosis not present

## 2021-11-12 DIAGNOSIS — M9902 Segmental and somatic dysfunction of thoracic region: Secondary | ICD-10-CM | POA: Diagnosis not present

## 2021-11-12 DIAGNOSIS — M5442 Lumbago with sciatica, left side: Secondary | ICD-10-CM | POA: Diagnosis not present

## 2021-11-12 DIAGNOSIS — M9903 Segmental and somatic dysfunction of lumbar region: Secondary | ICD-10-CM | POA: Diagnosis not present

## 2021-11-12 DIAGNOSIS — M5136 Other intervertebral disc degeneration, lumbar region: Secondary | ICD-10-CM | POA: Diagnosis not present

## 2021-11-12 DIAGNOSIS — M546 Pain in thoracic spine: Secondary | ICD-10-CM | POA: Diagnosis not present

## 2021-12-10 DIAGNOSIS — Z20822 Contact with and (suspected) exposure to covid-19: Secondary | ICD-10-CM | POA: Diagnosis not present

## 2021-12-13 DIAGNOSIS — Z20822 Contact with and (suspected) exposure to covid-19: Secondary | ICD-10-CM | POA: Diagnosis not present

## 2021-12-31 DIAGNOSIS — M5442 Lumbago with sciatica, left side: Secondary | ICD-10-CM | POA: Diagnosis not present

## 2021-12-31 DIAGNOSIS — M9903 Segmental and somatic dysfunction of lumbar region: Secondary | ICD-10-CM | POA: Diagnosis not present

## 2021-12-31 DIAGNOSIS — M9902 Segmental and somatic dysfunction of thoracic region: Secondary | ICD-10-CM | POA: Diagnosis not present

## 2021-12-31 DIAGNOSIS — M5136 Other intervertebral disc degeneration, lumbar region: Secondary | ICD-10-CM | POA: Diagnosis not present

## 2021-12-31 DIAGNOSIS — M546 Pain in thoracic spine: Secondary | ICD-10-CM | POA: Diagnosis not present

## 2022-01-12 DIAGNOSIS — E782 Mixed hyperlipidemia: Secondary | ICD-10-CM | POA: Diagnosis not present

## 2022-01-12 DIAGNOSIS — E559 Vitamin D deficiency, unspecified: Secondary | ICD-10-CM | POA: Diagnosis not present

## 2022-01-12 DIAGNOSIS — I10 Essential (primary) hypertension: Secondary | ICD-10-CM | POA: Diagnosis not present

## 2022-01-15 ENCOUNTER — Telehealth: Payer: Self-pay | Admitting: *Deleted

## 2022-01-15 NOTE — Telephone Encounter (Signed)
Dr.Jertson I  called patient to see if refill is needed and explained the below to her. Patient reports having issues in past with with her Rx's states "apparently it another patient with same name with cone". Patient said she does not know who that was regarding the below.  Patient is using [facility] as her pharmacy still.   Patient said her annual exam is scheduled in 08/2022 so she won't have so many doctor's appointments in March. She will need refill on Rx if annual exam is booked in 08/2022, encounter send to [facility].  Please advise

## 2022-01-15 NOTE — Telephone Encounter (Signed)
I received a call in triage voicemail the women did not leave her name and I could not identity where she was calling from. She said patient needs refill on estradiol 10 mcg tablet and to send it to [facility] pharmacy it appears patient should have refills until March 2023. [facility] called [facility] and I was told they have not been filling the patient's Rx anymore it was transferred to [facility] on [address]. I called the # [phone] and it was Physician chronic management team a recording came on and a man answered I explained I was calling from physician office and received a call from a women that left this # ,but did not leave her name. The man on the phone said he recommends I disregard the message.

## 2022-01-21 DIAGNOSIS — D225 Melanocytic nevi of trunk: Secondary | ICD-10-CM | POA: Diagnosis not present

## 2022-01-21 DIAGNOSIS — L820 Inflamed seborrheic keratosis: Secondary | ICD-10-CM | POA: Diagnosis not present

## 2022-01-21 DIAGNOSIS — L570 Actinic keratosis: Secondary | ICD-10-CM | POA: Diagnosis not present

## 2022-01-21 DIAGNOSIS — L578 Other skin changes due to chronic exposure to nonionizing radiation: Secondary | ICD-10-CM | POA: Diagnosis not present

## 2022-01-21 DIAGNOSIS — D2239 Melanocytic nevi of other parts of face: Secondary | ICD-10-CM | POA: Diagnosis not present

## 2022-01-22 NOTE — Telephone Encounter (Signed)
Please let the patient know that medicare is only allowing a breast and pelvic exam every other year.  We are now just having women come in for medication checks on the intervening years if they are on medication and are otherwise doing well.  I would just schedule her for a medication check in the next few months and call in enough vaginal estrogen to get her to that appointment.

## 2022-01-25 DIAGNOSIS — Z6831 Body mass index (BMI) 31.0-31.9, adult: Secondary | ICD-10-CM | POA: Diagnosis not present

## 2022-01-25 DIAGNOSIS — Z1331 Encounter for screening for depression: Secondary | ICD-10-CM | POA: Diagnosis not present

## 2022-01-25 DIAGNOSIS — E559 Vitamin D deficiency, unspecified: Secondary | ICD-10-CM | POA: Diagnosis not present

## 2022-01-25 DIAGNOSIS — Z0000 Encounter for general adult medical examination without abnormal findings: Secondary | ICD-10-CM | POA: Diagnosis not present

## 2022-01-25 DIAGNOSIS — Z79899 Other long term (current) drug therapy: Secondary | ICD-10-CM | POA: Diagnosis not present

## 2022-01-25 DIAGNOSIS — E782 Mixed hyperlipidemia: Secondary | ICD-10-CM | POA: Diagnosis not present

## 2022-01-25 NOTE — Telephone Encounter (Signed)
Patient informed with below note. Patient said she has 2 months worth of medication. I offered to send message to appointments, however patient said she will call them directly.

## 2022-01-25 NOTE — Telephone Encounter (Signed)
Patient medication appointments scheduled on 01/27/22

## 2022-01-27 ENCOUNTER — Other Ambulatory Visit: Payer: Self-pay

## 2022-01-27 ENCOUNTER — Encounter: Payer: Self-pay | Admitting: Obstetrics and Gynecology

## 2022-01-27 ENCOUNTER — Ambulatory Visit (INDEPENDENT_AMBULATORY_CARE_PROVIDER_SITE_OTHER): Payer: Medicare Other | Admitting: Obstetrics and Gynecology

## 2022-01-27 VITALS — BP 110/70 | HR 62 | Ht 63.0 in | Wt 179.0 lb

## 2022-01-27 DIAGNOSIS — Z8739 Personal history of other diseases of the musculoskeletal system and connective tissue: Secondary | ICD-10-CM | POA: Diagnosis not present

## 2022-01-27 DIAGNOSIS — N941 Unspecified dyspareunia: Secondary | ICD-10-CM | POA: Diagnosis not present

## 2022-01-27 DIAGNOSIS — N952 Postmenopausal atrophic vaginitis: Secondary | ICD-10-CM | POA: Diagnosis not present

## 2022-01-27 MED ORDER — ESTRADIOL 10 MCG VA TABS: ORAL_TABLET | VAGINAL | 4 refills | Status: DC

## 2022-01-27 NOTE — Patient Instructions (Signed)
Try uberlube for vaginal lubrication (buy it on line).

## 2022-01-27 NOTE — Progress Notes (Signed)
GYNECOLOGY  VISIT   HPI: 71 y.o.   Married White or Caucasian Not Hispanic or Latino  female   [id number] with Patient's last menstrual period was 09/12/2002.   here for Medication check. She is on vaginal estrogen, started last year for entry dyspareunia that wasn't helped with lubrication. At the time of her visit last year her vaginal apex was noted to be very narrow.  She got a vaginal dilator kit, hasn't used it much.  They are sexually active without much penetration, can't get in far. She feels that it has helped to prevent recurrent UTI's. She did have UTI symptoms in the fall, but a negative culture.  She also has questions about her DEXA, never heard the results from her DEXA in 10/22 at [facility].   GYNECOLOGIC HISTORY: Patient's last menstrual period was 09/12/2002. Contraception:PMP  Menopausal hormone therapy: estradiol vaginal tab.         OB History     Gravida  1   Para  0   Term  0   Preterm  0   AB  1   Living  0      SAB      IAB      Ectopic      Multiple      Live Births                 Patient Active Problem List   Diagnosis Date Noted   Arthritis    Complication of anesthesia    Coronary artery disease    Cystic breast    Heart murmur    Hyperlipidemia    Hypertension    Norovirus    PONV (postoperative nausea and vomiting)    Puncture wound    Tachycardia    Mixed dyslipidemia 04/09/2020   PAF (paroxysmal atrial fibrillation) ([place]) 02/14/2018   CAD (coronary artery disease) 09/16/2017   Supraventricular tachycardia ([place]) 08/01/2017   Essential hypertension 08/01/2017   Arthritis of left hip 03/22/2016   Primary osteoarthritis of left hip 03/20/2016   CMC arthritis, thumb, degenerative 11/28/2015   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   Primary osteoarthritis of first carpometacarpal joint of right hand 11/28/2015   Trigger finger of right thumb 11/28/2015   Plantar fasciitis 2006    Past Medical History:  Diagnosis Date    Arthritis    Arthritis of left hip 03/22/2016   CAD (coronary artery disease) 09/16/2017   CMC arthritis, thumb, degenerative 96/22/2979   Complication of anesthesia    Coronary artery disease    Cystic breast    Essential hypertension 08/01/2017   Heart murmur    " nothing to be concerned about"   Hyperlipidemia    Hypertension    Mixed dyslipidemia 04/09/2020   Norovirus    PAF (paroxysmal atrial fibrillation) ([place]) 02/14/2018   Plantar fasciitis 2006   right   PONV (postoperative nausea and vomiting)    Primary osteoarthritis of first carpometacarpal joint of right hand 11/28/2015   Primary osteoarthritis of left hip 03/20/2016   Puncture wound    right leg   Shortness of breath dyspnea    with exertion   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   Supraventricular tachycardia ([place]) 08/01/2017   Tachycardia    Trigger finger of right thumb 11/28/2015    Past Surgical History:  Procedure Laterality Date   COLONOSCOPY     HYSTEROSCOPY     D&C polypectomy   LEFT HEART CATH AND CORONARY ANGIOGRAPHY N/A 08/23/2017   Procedure:  LEFT HEART CATH AND CORONARY ANGIOGRAPHY;  Surgeon: Belva Crome, MD;  Location: [facility];  Service: Cardiovascular;  Laterality: N/A;   SPINAL FUSION  2008   c5-7   TOTAL HIP ARTHROPLASTY Left 03/22/2016   Procedure: TOTAL HIP ARTHROPLASTY ANTERIOR APPROACH;  Surgeon: Frederik Pear, MD;  Location: [facility];  Service: Orthopedics;  Laterality: Left;    Current Outpatient Medications  Medication Sig Dispense Refill   acetaminophen (TYLENOL) 650 MG CR tablet Take 650 mg by mouth at bedtime as needed for pain.     atorvastatin (LIPITOR) 10 MG tablet TAKE ONE TABLET BY MOUTH EVERYDAY AT BEDTIME 90 tablet 2   Calcium Carbonate-Vitamin D (CALCIUM-D PO) Take 2 tablets by mouth every evening.     ELIQUIS 5 MG TABS tablet TAKE ONE TABLET BY MOUTH AT BREAKFAST AND AT BEDTIME 180 tablet 1   Estradiol 10 MCG TABS vaginal tablet Place 1 tablet vaginally 2 (two) times a  week.     flecainide (TAMBOCOR) 50 MG tablet TAKE ONE TABLET BY MOUTH AT BREAKFAST AND AT BEDTIME 180 tablet 3   metoprolol succinate (TOPROL-XL) 50 MG 24 hr tablet Take 1 tablet by mouth daily.     nitrofurantoin, macrocrystal-monohydrate, (MACROBID) 100 MG capsule Take 100 mg by mouth 2 (two) times daily.     telmisartan-hydrochlorothiazide (MICARDIS HCT) 40-12.5 MG tablet Take 1 tablet by mouth every morning.   3   No current facility-administered medications for this visit.     ALLERGIES: Nsaids  Family History  Problem Relation Age of Onset   Stroke Mother    Heart attack Father    Cancer Maternal Grandmother        uterine?   Diabetes Paternal Grandmother     Social History   Socioeconomic History   Marital status: Married    Spouse name: Not on file   Number of children: Not on file   Years of education: Not on file   Highest education level: Not on file  Occupational History   Not on file  Tobacco Use   Smoking status: Never   Smokeless tobacco: Never  Vaping Use   Vaping Use: Never used  Substance and Sexual Activity   Alcohol use: No    Alcohol/week: 0.0 standard drinks   Drug use: No   Sexual activity: Not Currently    Partners: Male    Birth control/protection: Post-menopausal  Other Topics Concern   Not on file  Social History Narrative   Not on file   Social Determinants of Health   Financial Resource Strain: Not on file  Food Insecurity: Not on file  Transportation Needs: Not on file  Physical Activity: Not on file  Stress: Not on file  Social Connections: Not on file  Intimate Partner Violence: Not on file    Review of Systems  All other systems reviewed and are negative.  PHYSICAL EXAMINATION:    LMP 09/12/2002     General appearance: alert, cooperative and appears stated age  Pelvic: External genitalia:  no lesions              Urethra:  normal appearing urethra with no masses, tenderness or lesions              Bartholins and  Skenes: normal                 Vagina: atrophic appearing vagina. Able to insert 2 fingers ~3 cm into the vagina before it becomes uncomfortable. A band  of tightness is felt in the proximal vagina, under the cervix, not as restrictive as last year.               Cervix: no lesions              Bimanual Exam:  Uterus:   no masses or tenderness              Adnexa: no mass, fullness, tenderness               Chaperone was present for exam.  1. Vaginal atrophy Improved with vaginal estrogen, but she still has some atrophy and some tightening in the vagina.  Will increase her vaginal estrogen to 3 x a week for the next month.  - Estradiol 10 MCG TABS vaginal tablet; For the next month, place one tablet vaginally 3 x a week at hs. Then switch back to 1 tablet vaginally 2 x a week at hs.  Dispense: 24 tablet; Refill: 4 -F/U next year for a breast and pelvic exam  2. Dyspareunia, female See above. -Recommended she restart using the vaginal dilators and use them consistently. -When attempting intercourse use a lubricant (suggestion given) - Estradiol 10 MCG TABS vaginal tablet; For the next month, place one tablet vaginally 3 x a week at hs. Then switch back to 1 tablet vaginally 2 x a week at hs.  Dispense: 24 tablet; Refill: 4  3. History of osteopenia DEXA from [facility] on 10/06/21 reviewed with the patient. She has  stable osteopenia. T score of -1.7 in her right hip.  -Discussed calcium and vit d  -F/U DEXA in 2 years.  25 minutes in total patient care.

## 2022-01-28 DIAGNOSIS — M546 Pain in thoracic spine: Secondary | ICD-10-CM | POA: Diagnosis not present

## 2022-01-28 DIAGNOSIS — M9903 Segmental and somatic dysfunction of lumbar region: Secondary | ICD-10-CM | POA: Diagnosis not present

## 2022-01-28 DIAGNOSIS — M9902 Segmental and somatic dysfunction of thoracic region: Secondary | ICD-10-CM | POA: Diagnosis not present

## 2022-01-28 DIAGNOSIS — M5136 Other intervertebral disc degeneration, lumbar region: Secondary | ICD-10-CM | POA: Diagnosis not present

## 2022-01-28 DIAGNOSIS — M5442 Lumbago with sciatica, left side: Secondary | ICD-10-CM | POA: Diagnosis not present

## 2022-02-09 DIAGNOSIS — E782 Mixed hyperlipidemia: Secondary | ICD-10-CM | POA: Diagnosis not present

## 2022-02-09 DIAGNOSIS — I10 Essential (primary) hypertension: Secondary | ICD-10-CM | POA: Diagnosis not present

## 2022-02-09 DIAGNOSIS — E559 Vitamin D deficiency, unspecified: Secondary | ICD-10-CM | POA: Diagnosis not present

## 2022-02-26 DIAGNOSIS — M9902 Segmental and somatic dysfunction of thoracic region: Secondary | ICD-10-CM | POA: Diagnosis not present

## 2022-02-26 DIAGNOSIS — M546 Pain in thoracic spine: Secondary | ICD-10-CM | POA: Diagnosis not present

## 2022-02-26 DIAGNOSIS — M9903 Segmental and somatic dysfunction of lumbar region: Secondary | ICD-10-CM | POA: Diagnosis not present

## 2022-02-26 DIAGNOSIS — M5136 Other intervertebral disc degeneration, lumbar region: Secondary | ICD-10-CM | POA: Diagnosis not present

## 2022-02-26 DIAGNOSIS — M5442 Lumbago with sciatica, left side: Secondary | ICD-10-CM | POA: Diagnosis not present

## 2022-03-20 ENCOUNTER — Other Ambulatory Visit: Payer: Self-pay | Admitting: Obstetrics and Gynecology

## 2022-03-20 DIAGNOSIS — N952 Postmenopausal atrophic vaginitis: Secondary | ICD-10-CM

## 2022-03-20 DIAGNOSIS — N941 Unspecified dyspareunia: Secondary | ICD-10-CM

## 2022-04-04 ENCOUNTER — Other Ambulatory Visit: Payer: Self-pay | Admitting: Cardiology

## 2022-04-05 NOTE — Telephone Encounter (Signed)
Prescription refill request for Eliquis received. ?Indication:Afib ?Last office visit:11/22 ?Scr:0.8 ?Age: 71 ?Weight:81.2 kg ? ?Prescription refilled ? ?

## 2022-04-29 ENCOUNTER — Other Ambulatory Visit: Payer: Self-pay | Admitting: Obstetrics and Gynecology

## 2022-04-29 DIAGNOSIS — N952 Postmenopausal atrophic vaginitis: Secondary | ICD-10-CM

## 2022-04-29 DIAGNOSIS — N941 Unspecified dyspareunia: Secondary | ICD-10-CM

## 2022-04-30 ENCOUNTER — Telehealth: Payer: Self-pay | Admitting: *Deleted

## 2022-04-30 NOTE — Telephone Encounter (Signed)
Error

## 2022-04-30 NOTE — Telephone Encounter (Signed)
Patient called stating she would like Rx to be sent to [facility], [place]. Rx sent.

## 2022-05-11 DIAGNOSIS — L728 Other follicular cysts of the skin and subcutaneous tissue: Secondary | ICD-10-CM | POA: Diagnosis not present

## 2022-05-11 DIAGNOSIS — L820 Inflamed seborrheic keratosis: Secondary | ICD-10-CM | POA: Diagnosis not present

## 2022-05-11 DIAGNOSIS — L918 Other hypertrophic disorders of the skin: Secondary | ICD-10-CM | POA: Diagnosis not present

## 2022-05-11 DIAGNOSIS — L570 Actinic keratosis: Secondary | ICD-10-CM | POA: Diagnosis not present

## 2022-05-13 ENCOUNTER — Other Ambulatory Visit: Payer: Self-pay

## 2022-05-13 DIAGNOSIS — Z6831 Body mass index (BMI) 31.0-31.9, adult: Secondary | ICD-10-CM | POA: Diagnosis not present

## 2022-05-13 DIAGNOSIS — R102 Pelvic and perineal pain: Secondary | ICD-10-CM | POA: Diagnosis not present

## 2022-05-17 ENCOUNTER — Ambulatory Visit (INDEPENDENT_AMBULATORY_CARE_PROVIDER_SITE_OTHER): Payer: Medicare Other | Admitting: Cardiology

## 2022-05-17 ENCOUNTER — Encounter: Payer: Self-pay | Admitting: Cardiology

## 2022-05-17 VITALS — BP 120/56 | HR 64 | Ht 63.0 in | Wt 178.8 lb

## 2022-05-17 DIAGNOSIS — I2510 Atherosclerotic heart disease of native coronary artery without angina pectoris: Secondary | ICD-10-CM | POA: Diagnosis not present

## 2022-05-17 DIAGNOSIS — E782 Mixed hyperlipidemia: Secondary | ICD-10-CM

## 2022-05-17 DIAGNOSIS — I480 Paroxysmal atrial fibrillation: Secondary | ICD-10-CM | POA: Diagnosis not present

## 2022-05-17 DIAGNOSIS — M9903 Segmental and somatic dysfunction of lumbar region: Secondary | ICD-10-CM | POA: Diagnosis not present

## 2022-05-17 DIAGNOSIS — I10 Essential (primary) hypertension: Secondary | ICD-10-CM | POA: Diagnosis not present

## 2022-05-17 DIAGNOSIS — M546 Pain in thoracic spine: Secondary | ICD-10-CM | POA: Diagnosis not present

## 2022-05-17 DIAGNOSIS — M9902 Segmental and somatic dysfunction of thoracic region: Secondary | ICD-10-CM | POA: Diagnosis not present

## 2022-05-17 DIAGNOSIS — M5442 Lumbago with sciatica, left side: Secondary | ICD-10-CM | POA: Diagnosis not present

## 2022-05-17 DIAGNOSIS — M5136 Other intervertebral disc degeneration, lumbar region: Secondary | ICD-10-CM | POA: Diagnosis not present

## 2022-05-17 NOTE — Patient Instructions (Addendum)
Medication Instructions:  Your physician recommends that you continue on your current medications as directed. Please refer to the Current Medication list given to you today.  *If you need a refill on your cardiac medications before your next appointment, please call your pharmacy*   Lab Work: None ordered If you have labs (blood work) drawn today and your tests are completely normal, you will receive your results only by: [facility] (if you have MyChart) OR A paper copy in the mail If you have any lab test that is abnormal or we need to change your treatment, we will call you to review the results.   Testing/Procedures: Your physician has requested that you have a lexiscan myoview. For further information please visit [email or web address]. Please follow instruction sheet, as given.  The test will take approximately 3 to 4 hours to complete; you may bring reading material.  If someone comes with you to your appointment, they will need to remain in the main lobby due to limited space in the testing area.  How to prepare for your Myocardial Perfusion Test: Do not eat or drink 3 hours prior to your test, except you may have water. Do not consume products containing caffeine (regular or decaffeinated) 12 hours prior to your test. (ex: coffee, chocolate, sodas, tea). Do bring a list of your current medications with you.  If not listed below, you may take your medications as normal. Do wear comfortable clothes (no dresses or overalls) and walking shoes, tennis shoes preferred (No heels or open toe shoes are allowed). Do NOT wear cologne, perfume, aftershave, or lotions (deodorant is allowed). If these instructions are not followed, your test will have to be rescheduled.    Follow-Up: At [facility], you and your health needs are our priority.  As part of our continuing mission to provide you with exceptional heart care, we have created designated Provider Care Teams.  These Care Teams  include your primary Cardiologist (physician) and Advanced Practice Providers (APPs -  Physician Assistants and Nurse Practitioners) who all work together to provide you with the care you need, when you need it.  We recommend signing up for the patient portal called "MyChart".  Sign up information is provided on this After Visit Summary.  MyChart is used to connect with patients for Virtual Visits (Telemedicine).  Patients are able to view lab/test results, encounter notes, upcoming appointments, etc.  Non-urgent messages can be sent to your provider as well.   To learn more about what you can do with MyChart, go to [email or web address].    Your next appointment:   6 month(s)  The format for your next appointment:   In Person  Provider:   Jyl Heinz, MD   Other Instructions Cardiac Nuclear Scan A cardiac nuclear scan is a test that is done to check the flow of blood to your heart. It is done when you are resting and when you are exercising. The test looks for problems such as: Not enough blood reaching a portion of the heart. The heart muscle not working as it should. You may need this test if: You have heart disease. You have had lab results that are not normal. You have had heart surgery or a balloon procedure to open up blocked arteries (angioplasty). You have chest pain. You have shortness of breath. In this test, a special dye (tracer) is put into your bloodstream. The tracer will travel to your heart. A camera will then take pictures of your heart to see how  the tracer moves through your heart. This test is usually done at a hospital and takes 2-4 hours. Tell a doctor about: Any allergies you have. All medicines you are taking, including vitamins, herbs, eye drops, creams, and over-the-counter medicines. Any problems you or family members have had with anesthetic medicines. Any blood disorders you have. Any surgeries you have had. Any medical conditions you  have. Whether you are pregnant or may be pregnant. What are the risks? Generally, this is a safe test. However, problems may occur, such as: Serious chest pain and heart attack. This is only a risk if the stress portion of the test is done. Rapid heartbeat. A feeling of warmth in your chest. This feeling usually does not last long. Allergic reaction to the tracer. What happens before the test? Ask your doctor about changing or stopping your normal medicines. This is important. Follow instructions from your doctor about what you cannot eat or drink. Remove your jewelry on the day of the test. What happens during the test? An IV tube will be inserted into one of your veins. Your doctor will give you a small amount of tracer through the IV tube. You will wait for 20-40 minutes while the tracer moves through your bloodstream. Your heart will be monitored with an electrocardiogram (ECG). You will lie down on an exam table. Pictures of your heart will be taken for about 15-20 minutes. You may also have a stress test. For this test, one of these things may be done: You will be asked to exercise on a treadmill or a stationary bike. You will be given medicines that will make your heart work harder. This is done if you are unable to exercise. When blood flow to your heart has peaked, a tracer will again be given through the IV tube. After 20-40 minutes, you will get back on the exam table. More pictures will be taken of your heart. Depending on the tracer that is used, more pictures may need to be taken 3-4 hours later. Your IV tube will be removed when the test is over. The test may vary among doctors and hospitals. What happens after the test? Ask your doctor: Whether you can return to your normal schedule, including diet, activities, and medicines. Whether you should drink more fluids. This will help to remove the tracer from your body. Drink enough fluid to keep your pee (urine) pale  yellow. Ask your doctor, or the department that is doing the test: When will my results be ready? How will I get my results? Summary A cardiac nuclear scan is a test that is done to check the flow of blood to your heart. Tell your doctor whether you are pregnant or may be pregnant. Before the test, ask your doctor about changing or stopping your normal medicines. This is important. Ask your doctor whether you can return to your normal activities. You may be asked to drink more fluids. This information is not intended to replace advice given to you by your health care provider. Make sure you discuss any questions you have with your health care provider. Document Revised: 08/12/2021 Document Reviewed: 05/13/2021 Elsevier Patient Education  [place].

## 2022-05-17 NOTE — Progress Notes (Signed)
Cardiology Office Note:    Date:  05/17/2022   ID:  [name], DOB Jan 27, 1951, MRN [id number]  PCP:  Ronita Hipps, MD  Cardiologist:  Jenean Lindau, MD   Referring MD: Ronita Hipps, MD    ASSESSMENT:    1. PAF (paroxysmal atrial fibrillation) ([place])   2. Essential hypertension   3. Coronary artery disease involving native coronary artery of native heart without angina pectoris   4. Mixed dyslipidemia    PLAN:    In order of problems listed above:  Coronary artery disease: Secondary prevention stressed with the patient.  Importance of compliance with diet medication stressed and she vocalized understanding.  She had mild coronary artery disease by cath in 2018 where she had 30 to 40% proximal LAD eccentric plaque. Paroxysmal atrial fibrillation:I discussed with the patient atrial fibrillation, disease process. Management and therapy including rate and rhythm control, anticoagulation benefits and potential risks were discussed extensively with the patient. Patient had multiple questions which were answered to patient's satisfaction.  She occasionally has palpitations once or twice in 6 months she recalls her EKG and it reveals atrial fibrillation only during those times.  She will continue current medications.  I will do a Lexiscan sestamibi as she is on flecainide. Essential hypertension: Blood pressure stable Mixed dyslipidemia and obesity: Weight reduction stressed diet emphasized and she promises to do better.  She was advised to walk at least half an hour 5 days a week and she promises to do so. Patient will be seen in follow-up appointment in 6 months or earlier if the patient has any concerns    Medication Adjustments/Labs and Tests Ordered: Current medicines are reviewed at length with the patient today.  Concerns regarding medicines are outlined above.  No orders of the defined types were placed in this encounter.  No orders of the defined types were placed in this  encounter.    No chief complaint on file.    History of Present Illness:    Ashley Mcdonald is a 71 y.o. female.  Patient has past medical history of coronary artery disease, paroxysmal atrial fibrillation, essential hypertension and mixed dyslipidemia.  She denies any problems at this time and takes care of activities of daily living.  No chest pain orthopnea or PND.  At the time of my evaluation, the patient is alert awake oriented and in no distress.  She gives history of palpitations happening about once or twice in 6 months.  Past Medical History:  Diagnosis Date   Arthritis    Arthritis of left hip 03/22/2016   CAD (coronary artery disease) 09/16/2017   CMC arthritis, thumb, degenerative 58/85/0277   Complication of anesthesia    Coronary artery disease    Cystic breast    Essential hypertension 08/01/2017   Heart murmur    " nothing to be concerned about"   Hyperlipidemia    Hypertension    Mixed dyslipidemia 04/09/2020   Norovirus    PAF (paroxysmal atrial fibrillation) ([place]) 02/14/2018   Plantar fasciitis 2006   right   PONV (postoperative nausea and vomiting)    Primary osteoarthritis of first carpometacarpal joint of right hand 11/28/2015   Primary osteoarthritis of left hip 03/20/2016   Puncture wound    right leg   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   Supraventricular tachycardia ([place]) 08/01/2017   Tachycardia    Trigger finger of right thumb 11/28/2015    Past Surgical History:  Procedure Laterality Date   COLONOSCOPY  HYSTEROSCOPY     D&C polypectomy   LEFT HEART CATH AND CORONARY ANGIOGRAPHY N/A 08/23/2017   Procedure: LEFT HEART CATH AND CORONARY ANGIOGRAPHY;  Surgeon: Belva Crome, MD;  Location: [facility];  Service: Cardiovascular;  Laterality: N/A;   SPINAL FUSION  2008   c5-7   TOTAL HIP ARTHROPLASTY Left 03/22/2016   Procedure: TOTAL HIP ARTHROPLASTY ANTERIOR APPROACH;  Surgeon: Frederik Pear, MD;  Location: [facility];  Service:  Orthopedics;  Laterality: Left;    Current Medications: Current Meds  Medication Sig   acetaminophen (TYLENOL) 650 MG CR tablet Take 650 mg by mouth at bedtime as needed for pain.   atorvastatin (LIPITOR) 10 MG tablet TAKE ONE TABLET BY MOUTH EVERYDAY AT BEDTIME   Calcium Carbonate-Vitamin D (CALCIUM-D PO) Take 2 tablets by mouth every evening.   cyclobenzaprine (FLEXERIL) 5 MG tablet Take 5 mg by mouth as needed for muscle spasms.   doxycycline (VIBRAMYCIN) 100 MG capsule Take 100 mg by mouth 2 (two) times daily.   ELIQUIS 5 MG TABS tablet TAKE ONE TABLET BY MOUTH AT BREAKFAST AND AT BEDTIME   Estradiol (YUVAFEM) 10 MCG TABS vaginal tablet INSERT 1 TABLET VAGINALLY TWICE WEEKLY   flecainide (TAMBOCOR) 50 MG tablet TAKE ONE TABLET BY MOUTH AT BREAKFAST AND AT BEDTIME   hydrOXYzine (ATARAX) 25 MG tablet Take 25 mg by mouth at bedtime as needed for itching, nausea/vomiting or rash.   metoprolol succinate (TOPROL-XL) 50 MG 24 hr tablet Take 1 tablet by mouth daily.   telmisartan-hydrochlorothiazide (MICARDIS HCT) 40-12.5 MG tablet Take 1 tablet by mouth every morning.      Allergies:   Nsaids   Social History   Socioeconomic History   Marital status: Married    Spouse name: Not on file   Number of children: Not on file   Years of education: Not on file   Highest education level: Not on file  Occupational History   Not on file  Tobacco Use   Smoking status: Never   Smokeless tobacco: Never  Vaping Use   Vaping Use: Never used  Substance and Sexual Activity   Alcohol use: No    Alcohol/week: 0.0 standard drinks   Drug use: No   Sexual activity: Not Currently    Partners: Male    Birth control/protection: Post-menopausal  Other Topics Concern   Not on file  Social History Narrative   Not on file   Social Determinants of Health   Financial Resource Strain: Not on file  Food Insecurity: Not on file  Transportation Needs: Not on file  Physical Activity: Not on file   Stress: Not on file  Social Connections: Not on file     Family History: The patient's family history includes Cancer in her maternal grandmother; Diabetes in her paternal grandmother; Heart attack in her father; Stroke in her mother.  ROS:   Please see the history of present illness.    All other systems reviewed and are negative.  EKGs/Labs/Other Studies Reviewed:    The following studies were reviewed today: EKG reveals sinus rhythm and nonspecific ST-T changes   Recent Labs: No results found for requested labs within last 8760 hours.  Recent Lipid Panel    Component Value Date/Time   CHOL 116 04/24/2021 0852   TRIG 163 (H) 04/24/2021 0852   HDL 32 (L) 04/24/2021 0852   CHOLHDL 3.6 04/24/2021 0852   LDLCALC 56 04/24/2021 0852    Physical Exam:    VS:  BP Marland Kitchen)  120/56   Pulse 64   Ht '5\' 3"'$  (1.6 m)   Wt 178 lb 12.8 oz (81.1 kg)   LMP 09/12/2002   SpO2 94%   BMI 31.67 kg/m     Wt Readings from Last 3 Encounters:  05/17/22 178 lb 12.8 oz (81.1 kg)  01/27/22 179 lb (81.2 kg)  11/04/21 182 lb (82.6 kg)     GEN: Patient is in no acute distress HEENT: Normal NECK: No JVD; No carotid bruits LYMPHATICS: No lymphadenopathy CARDIAC: Hear sounds regular, 2/6 systolic murmur at the apex. RESPIRATORY:  Clear to auscultation without rales, wheezing or rhonchi  ABDOMEN: Soft, non-tender, non-distended MUSCULOSKELETAL:  No edema; No deformity  SKIN: Warm and dry NEUROLOGIC:  Alert and oriented x 3 PSYCHIATRIC:  Normal affect   Signed, Jenean Lindau, MD  05/17/2022 1:26 PM    [facility]

## 2022-05-18 ENCOUNTER — Ambulatory Visit (INDEPENDENT_AMBULATORY_CARE_PROVIDER_SITE_OTHER): Payer: Medicare Other

## 2022-05-18 DIAGNOSIS — I480 Paroxysmal atrial fibrillation: Secondary | ICD-10-CM | POA: Diagnosis not present

## 2022-05-18 DIAGNOSIS — I2510 Atherosclerotic heart disease of native coronary artery without angina pectoris: Secondary | ICD-10-CM | POA: Diagnosis not present

## 2022-05-18 LAB — MYOCARDIAL PERFUSION IMAGING
LV dias vol: 54 mL (ref 46–106)
LV sys vol: 16 mL
Nuc Stress EF: 70 %
Peak HR: 77 {beats}/min
Rest HR: 57 {beats}/min
Rest Nuclear Isotope Dose: 10.7 mCi
SDS: 0
SRS: 0
SSS: 0
Stress Nuclear Isotope Dose: 30.6 mCi
TID: 1.06

## 2022-05-18 MED ORDER — TECHNETIUM TC 99M TETROFOSMIN IV KIT
10.7000 | PACK | Freq: Once | INTRAVENOUS | Status: AC | PRN
  Administered 2022-05-18: 10.7 via INTRAVENOUS

## 2022-05-18 MED ORDER — REGADENOSON 0.4 MG/5ML IV SOLN
0.4000 mg | Freq: Once | INTRAVENOUS | Status: AC
  Administered 2022-05-18: 0.4 mg via INTRAVENOUS

## 2022-05-18 MED ORDER — TECHNETIUM TC 99M TETROFOSMIN IV KIT
30.6000 | PACK | Freq: Once | INTRAVENOUS | Status: AC | PRN
  Administered 2022-05-18: 30.6 via INTRAVENOUS

## 2022-05-30 DIAGNOSIS — N3001 Acute cystitis with hematuria: Secondary | ICD-10-CM | POA: Diagnosis not present

## 2022-08-05 DIAGNOSIS — M546 Pain in thoracic spine: Secondary | ICD-10-CM | POA: Diagnosis not present

## 2022-08-05 DIAGNOSIS — M9902 Segmental and somatic dysfunction of thoracic region: Secondary | ICD-10-CM | POA: Diagnosis not present

## 2022-08-05 DIAGNOSIS — M5442 Lumbago with sciatica, left side: Secondary | ICD-10-CM | POA: Diagnosis not present

## 2022-08-05 DIAGNOSIS — M5136 Other intervertebral disc degeneration, lumbar region: Secondary | ICD-10-CM | POA: Diagnosis not present

## 2022-08-05 DIAGNOSIS — M9903 Segmental and somatic dysfunction of lumbar region: Secondary | ICD-10-CM | POA: Diagnosis not present

## 2022-08-11 DIAGNOSIS — Z23 Encounter for immunization: Secondary | ICD-10-CM | POA: Diagnosis not present

## 2022-08-11 DIAGNOSIS — Z6831 Body mass index (BMI) 31.0-31.9, adult: Secondary | ICD-10-CM | POA: Diagnosis not present

## 2022-08-11 DIAGNOSIS — G4700 Insomnia, unspecified: Secondary | ICD-10-CM | POA: Diagnosis not present

## 2022-08-18 ENCOUNTER — Ambulatory Visit: Payer: Medicare Other | Admitting: Obstetrics and Gynecology

## 2022-08-30 DIAGNOSIS — J209 Acute bronchitis, unspecified: Secondary | ICD-10-CM | POA: Diagnosis not present

## 2022-08-30 DIAGNOSIS — J019 Acute sinusitis, unspecified: Secondary | ICD-10-CM | POA: Diagnosis not present

## 2022-10-08 ENCOUNTER — Other Ambulatory Visit: Payer: Self-pay

## 2022-10-08 ENCOUNTER — Other Ambulatory Visit: Payer: Self-pay | Admitting: Cardiology

## 2022-10-08 MED ORDER — ATORVASTATIN CALCIUM 10 MG PO TABS: ORAL_TABLET | ORAL | 0 refills | Status: DC

## 2022-10-08 NOTE — Telephone Encounter (Signed)
Atorvastatin refill to pharmacy

## 2022-10-12 DIAGNOSIS — Z1231 Encounter for screening mammogram for malignant neoplasm of breast: Secondary | ICD-10-CM | POA: Diagnosis not present

## 2022-10-14 ENCOUNTER — Other Ambulatory Visit: Payer: Self-pay | Admitting: Cardiology

## 2022-10-15 ENCOUNTER — Telehealth: Payer: Self-pay | Admitting: Cardiology

## 2022-10-15 MED ORDER — ATORVASTATIN CALCIUM 10 MG PO TABS: 10.0000 mg | ORAL_TABLET | Freq: Every day | ORAL | 1 refills | Status: DC

## 2022-10-15 NOTE — Telephone Encounter (Signed)
*  STAT* If patient is at the pharmacy, call can be transferred to refill team.   1. Which medications need to be refilled? (please list name of each medication and dose if known)   atorvastatin (LIPITOR) 10 MG tablet   2. Which pharmacy/location (including street and city if local pharmacy) is medication to be sent to?  [facility] - [place], [place] - [place] [address]   3. Do they need a 30 day or 90 day supply? 90 day   Caller stated the patient still has some of this medication left.

## 2022-10-15 NOTE — Telephone Encounter (Signed)
Refill of Atorvastatin 10 mg sent to [facility].

## 2022-10-19 DIAGNOSIS — N6002 Solitary cyst of left breast: Secondary | ICD-10-CM | POA: Diagnosis not present

## 2022-10-19 DIAGNOSIS — R922 Inconclusive mammogram: Secondary | ICD-10-CM | POA: Diagnosis not present

## 2022-10-19 DIAGNOSIS — R928 Other abnormal and inconclusive findings on diagnostic imaging of breast: Secondary | ICD-10-CM | POA: Diagnosis not present

## 2022-10-25 DIAGNOSIS — M9903 Segmental and somatic dysfunction of lumbar region: Secondary | ICD-10-CM | POA: Diagnosis not present

## 2022-10-25 DIAGNOSIS — M9902 Segmental and somatic dysfunction of thoracic region: Secondary | ICD-10-CM | POA: Diagnosis not present

## 2022-10-25 DIAGNOSIS — M546 Pain in thoracic spine: Secondary | ICD-10-CM | POA: Diagnosis not present

## 2022-10-25 DIAGNOSIS — M5442 Lumbago with sciatica, left side: Secondary | ICD-10-CM | POA: Diagnosis not present

## 2022-10-25 DIAGNOSIS — M5136 Other intervertebral disc degeneration, lumbar region: Secondary | ICD-10-CM | POA: Diagnosis not present

## 2022-10-27 ENCOUNTER — Encounter: Payer: Self-pay | Admitting: Obstetrics and Gynecology

## 2022-11-09 DIAGNOSIS — Z23 Encounter for immunization: Secondary | ICD-10-CM | POA: Diagnosis not present

## 2022-11-10 DIAGNOSIS — L821 Other seborrheic keratosis: Secondary | ICD-10-CM | POA: Diagnosis not present

## 2022-11-10 DIAGNOSIS — L570 Actinic keratosis: Secondary | ICD-10-CM | POA: Diagnosis not present

## 2022-11-10 DIAGNOSIS — D2239 Melanocytic nevi of other parts of face: Secondary | ICD-10-CM | POA: Diagnosis not present

## 2022-11-10 DIAGNOSIS — L814 Other melanin hyperpigmentation: Secondary | ICD-10-CM | POA: Diagnosis not present

## 2022-11-10 DIAGNOSIS — D225 Melanocytic nevi of trunk: Secondary | ICD-10-CM | POA: Diagnosis not present

## 2022-11-19 DIAGNOSIS — Z23 Encounter for immunization: Secondary | ICD-10-CM | POA: Diagnosis not present

## 2022-11-23 ENCOUNTER — Other Ambulatory Visit: Payer: Self-pay

## 2022-11-23 ENCOUNTER — Telehealth: Payer: Self-pay | Admitting: Cardiology

## 2022-11-23 NOTE — Telephone Encounter (Signed)
Pt c/o BP issue: STAT if pt c/o blurred vision, one-sided weakness or slurred speech  1. What are your last 5 BP readings? 147/104 this morning 123/86 yesterday  2. Are you having any other symptoms (ex. Dizziness, headache, blurred vision, passed out)? Heart rate is high- this morning it is 107 and yesterday it stayed at  115- feel weak and tired  3. What is your BP issue?  Patient wants an appointment today

## 2022-11-23 NOTE — Telephone Encounter (Signed)
Called the patient and she reported that she has a history of A-fib and her blood pressure and heart rate have been increasing. Her blood pressure yesterday was 123/86 HR 115, today her blood pressure 147/104 HR 107. She stated that she had taken her blood pressure at the same time she had taken her blood pressure medication. I instructed her to wait 2 hours after she takes her blood pressure medication to take her blood pressure, this would give her a more accurate reading. I had her check her blood pressure during the call and it was 105/78 HR 106. She continues to feel tired and weak. No SOB or chest discomfort at this time. Please advise

## 2022-11-24 ENCOUNTER — Other Ambulatory Visit: Payer: Self-pay

## 2022-11-26 ENCOUNTER — Ambulatory Visit: Payer: Medicare Other | Attending: Cardiology | Admitting: Cardiology

## 2022-11-26 ENCOUNTER — Encounter: Payer: Self-pay | Admitting: Cardiology

## 2022-11-26 VITALS — BP 138/72 | HR 88 | Ht 64.0 in | Wt 177.4 lb

## 2022-11-26 DIAGNOSIS — I10 Essential (primary) hypertension: Secondary | ICD-10-CM

## 2022-11-26 DIAGNOSIS — I2510 Atherosclerotic heart disease of native coronary artery without angina pectoris: Secondary | ICD-10-CM | POA: Insufficient documentation

## 2022-11-26 DIAGNOSIS — I480 Paroxysmal atrial fibrillation: Secondary | ICD-10-CM | POA: Insufficient documentation

## 2022-11-26 DIAGNOSIS — E782 Mixed hyperlipidemia: Secondary | ICD-10-CM | POA: Diagnosis not present

## 2022-11-26 NOTE — Patient Instructions (Addendum)
Medication Instructions:  Your physician recommends that you continue on your current medications as directed. Please refer to the Current Medication list given to you today.  *If you need a refill on your cardiac medications before your next appointment, please call your pharmacy*   Lab Work: Your physician recommends that you return for lab work in:   Labs today in [place]: BMP, CBC, TSH, Magnesium  If you have labs (blood work) drawn today and your tests are completely normal, you will receive your results only by: MyChart Message (if you have [place]) OR A paper copy in the mail If you have any lab test that is abnormal or we need to change your treatment, we will call you to review the results.   Testing/Procedures: None   Follow-Up: At  [facility], you and your health needs are our priority.  As part of our continuing mission to provide you with exceptional heart care, we have created designated Provider Care Teams.  These Care Teams include your primary Cardiologist (physician) and Advanced Practice Providers (APPs -  Physician Assistants and Nurse Practitioners) who all work together to provide you with the care you need, when you need it.  We recommend signing up for the patient portal called "MyChart".  Sign up information is provided on this After Visit Summary.  MyChart is used to connect with patients for Virtual Visits (Telemedicine).  Patients are able to view lab/test results, encounter notes, upcoming appointments, etc.  Non-urgent messages can be sent to your provider as well.   To learn more about what you can do with MyChart, go to [email or web address].    Your next appointment:   6 month(s)  The format for your next appointment:   In Person  Provider:   Jyl Heinz, MD    Other Instructions None  Important Information About Sugar

## 2022-11-26 NOTE — Progress Notes (Signed)
Cardiology Office Note:    Date:  11/26/2022   ID:  [name], DOB 14-Jul-1951, MRN [id number]     PCP:  Ronita Hipps, MD  Cardiologist:  Jenean Lindau, MD   Referring MD: Ronita Hipps, MD    ASSESSMENT:    1. Coronary artery disease involving native coronary artery of native heart without angina pectoris   2. Essential hypertension   3. PAF (paroxysmal atrial fibrillation) ([place])   4. Mixed dyslipidemia    PLAN:    In order of problems listed above:  Coronary artery disease: Secondary prevention stressed with the patient.  Importance of compliance with diet medication stressed and she vocalized understanding.  She was advised to walk at least half an hour a day 5 days a week and she promises to do so. Essential hypertension: Blood pressure stable and diet was emphasized.  Lifestyle modification urged.  Heart rate and blood pressure log was reviewed by me. Mixed dyslipidemia: On lipid-lowering medications.  Lipids done by primary care and I reviewed them.  Weight reduction stressed diet emphasized.  She promises to do better. Patient will be seen in follow-up appointment in 6 months or earlier if the patient has any concerns    Medication Adjustments/Labs and Tests Ordered: Current medicines are reviewed at length with the patient today.  Concerns regarding medicines are outlined above.  No orders of the defined types were placed in this encounter.  No orders of the defined types were placed in this encounter.    No chief complaint on file.    History of Present Illness:    Ashley Mcdonald is a 71 y.o. female.  Patient has past medical history of coronary artery disease, essential hypertension, mixed dyslipidemia and she is overweight.  She leads a sedentary lifestyle.  She had some concerns about elevated heart rate and blood pressure.  She has cardia self monitoring and denies any paroxysms of atrial fibrillation.  At the time of my evaluation, the patient is  alert awake oriented and in no distress.  Past Medical History:  Diagnosis Date   Arthritis    Arthritis of left hip 03/22/2016   CAD (coronary artery disease) 09/16/2017   CMC arthritis, thumb, degenerative 10/06/8526   Complication of anesthesia    Coronary artery disease    Cystic breast    Essential hypertension 08/01/2017   Heart murmur    " nothing to be concerned about"   Hyperlipidemia    Hypertension    Mixed dyslipidemia 04/09/2020   Norovirus    PAF (paroxysmal atrial fibrillation) ([place]) 02/14/2018   Plantar fasciitis 2006   right   PONV (postoperative nausea and vomiting)    Primary osteoarthritis of first carpometacarpal joint of right hand 11/28/2015   Primary osteoarthritis of left hip 03/20/2016   Puncture wound    right leg   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   Supraventricular tachycardia 08/01/2017   Tachycardia    Trigger finger of right thumb 11/28/2015    Past Surgical History:  Procedure Laterality Date   COLONOSCOPY     HYSTEROSCOPY     D&C polypectomy   LEFT HEART CATH AND CORONARY ANGIOGRAPHY N/A 08/23/2017   Procedure: LEFT HEART CATH AND CORONARY ANGIOGRAPHY;  Surgeon: Belva Crome, MD;  Location: [facility];  Service: Cardiovascular;  Laterality: N/A;   SPINAL FUSION  2008   c5-7   TOTAL HIP ARTHROPLASTY Left 03/22/2016   Procedure: TOTAL HIP ARTHROPLASTY ANTERIOR APPROACH;  Surgeon:  Frederik Pear, MD;  Location: [facility];  Service: Orthopedics;  Laterality: Left;    Current Medications: No outpatient medications have been marked as taking for the 11/26/22 encounter (Appointment) with Novelle Addair, Reita Cliche, MD.     Allergies:   Nsaids   Social History   Socioeconomic History   Marital status: Married    Spouse name: Not on file   Number of children: Not on file   Years of education: Not on file   Highest education level: Not on file  Occupational History   Not on file  Tobacco Use   Smoking status: Never   Smokeless tobacco:  Never  Vaping Use   Vaping Use: Never used  Substance and Sexual Activity   Alcohol use: No    Alcohol/week: 0.0 standard drinks of alcohol   Drug use: No   Sexual activity: Not Currently    Partners: Male    Birth control/protection: Post-menopausal  Other Topics Concern   Not on file  Social History Narrative   Not on file   Social Determinants of Health   Financial Resource Strain: Not on file  Food Insecurity: Not on file  Transportation Needs: Not on file  Physical Activity: Not on file  Stress: Not on file  Social Connections: Not on file     Family History: The patient's family history includes Cancer in her maternal grandmother; Diabetes in her paternal grandmother; Heart attack in her father; Stroke in her mother.  ROS:   Please see the history of present illness.    All other systems reviewed and are negative.  EKGs/Labs/Other Studies Reviewed:    The following studies were reviewed today: EKG reveals sinus rhythm and nonspecific ST-T changes and QRS is within normal limits.  QTc was prolonged so we will get a Chem-7 magnesium TSH and CBC.   Recent Labs: No results found for requested labs within last 365 days.  Recent Lipid Panel    Component Value Date/Time   CHOL 116 04/24/2021 0852   TRIG 163 (H) 04/24/2021 0852   HDL 32 (L) 04/24/2021 0852   CHOLHDL 3.6 04/24/2021 0852   LDLCALC 56 04/24/2021 0852    Physical Exam:    VS:  LMP 09/12/2002     Wt Readings from Last 3 Encounters:  05/18/22 178 lb (80.7 kg)  05/17/22 178 lb 12.8 oz (81.1 kg)  01/27/22 179 lb (81.2 kg)     GEN: Patient is in no acute distress HEENT: Normal NECK: No JVD; No carotid bruits LYMPHATICS: No lymphadenopathy CARDIAC: Hear sounds regular, 2/6 systolic murmur at the apex. RESPIRATORY:  Clear to auscultation without rales, wheezing or rhonchi  ABDOMEN: Soft, non-tender, non-distended MUSCULOSKELETAL:  No edema; No deformity  SKIN: Warm and dry NEUROLOGIC:  Alert  and oriented x 3 PSYCHIATRIC:  Normal affect   Signed, Jenean Lindau, MD  11/26/2022 9:15 AM    [place]

## 2022-11-27 LAB — BASIC METABOLIC PANEL
BUN/Creatinine Ratio: 18 (ref 12–28)
BUN: 16 mg/dL (ref 8–27)
CO2: 20 mmol/L (ref 20–29)
Calcium: 9.5 mg/dL (ref 8.7–10.3)
Chloride: 102 mmol/L (ref 96–106)
Creatinine, Ser: 0.89 mg/dL (ref 0.57–1.00)
Glucose: 163 mg/dL — ABNORMAL HIGH (ref 70–99)
Potassium: 3.9 mmol/L (ref 3.5–5.2)
Sodium: 138 mmol/L (ref 134–144)
eGFR: 69 mL/min/{1.73_m2} (ref >59)

## 2022-11-27 LAB — CBC
Hematocrit: 41.2 % (ref 34.0–46.6)
Hemoglobin: 13.8 g/dL (ref 11.1–15.9)
MCH: 27.5 pg (ref 26.6–33.0)
MCHC: 33.5 g/dL (ref 31.5–35.7)
MCV: 82 fL (ref 79–97)
Platelets: 193 x10E3/uL (ref 150–450)
RBC: 5.01 x10E6/uL (ref 3.77–5.28)
RDW: 13.1 % (ref 11.7–15.4)
WBC: 4.9 x10E3/uL (ref 3.4–10.8)

## 2022-11-27 LAB — TSH: TSH: 2.26 u[IU]/mL (ref 0.450–4.500)

## 2022-11-27 LAB — MAGNESIUM: Magnesium: 2 mg/dL (ref 1.6–2.3)

## 2022-12-13 DIAGNOSIS — M8580 Other specified disorders of bone density and structure, unspecified site: Secondary | ICD-10-CM | POA: Insufficient documentation

## 2022-12-13 HISTORY — DX: Other specified disorders of bone density and structure, unspecified site: M85.80

## 2023-01-06 ENCOUNTER — Other Ambulatory Visit: Payer: Self-pay | Admitting: Cardiology

## 2023-01-06 DIAGNOSIS — I480 Paroxysmal atrial fibrillation: Secondary | ICD-10-CM

## 2023-01-06 NOTE — Telephone Encounter (Signed)
Prescription refill request for Eliquis received. Indication: PAF Last office visit: 11/26/22  R Revankar MD Scr: 0.89 on 11/26/22 Age: 72 Weight: 80.5kg  Based on above findings Eliquis '5mg'$  twice daily is the appropriate dose.  Refill approved.

## 2023-01-06 NOTE — Telephone Encounter (Signed)
Prescription refill request for Eliquis received. Indication: Afib  Last office visit: 11/26/22 (Revankar) Scr: 0.89 (11/26/22)  Age: 72 Weight: 80.5kg  Appropriate dose. Refill sent.

## 2023-01-13 DIAGNOSIS — M546 Pain in thoracic spine: Secondary | ICD-10-CM | POA: Diagnosis not present

## 2023-01-13 DIAGNOSIS — M9903 Segmental and somatic dysfunction of lumbar region: Secondary | ICD-10-CM | POA: Diagnosis not present

## 2023-01-13 DIAGNOSIS — M5136 Other intervertebral disc degeneration, lumbar region: Secondary | ICD-10-CM | POA: Diagnosis not present

## 2023-01-13 DIAGNOSIS — M9902 Segmental and somatic dysfunction of thoracic region: Secondary | ICD-10-CM | POA: Diagnosis not present

## 2023-01-13 DIAGNOSIS — M5442 Lumbago with sciatica, left side: Secondary | ICD-10-CM | POA: Diagnosis not present

## 2023-02-16 DIAGNOSIS — Z6831 Body mass index (BMI) 31.0-31.9, adult: Secondary | ICD-10-CM | POA: Diagnosis not present

## 2023-02-16 DIAGNOSIS — K5792 Diverticulitis of intestine, part unspecified, without perforation or abscess without bleeding: Secondary | ICD-10-CM | POA: Diagnosis not present

## 2023-02-22 ENCOUNTER — Other Ambulatory Visit: Payer: Self-pay | Admitting: Obstetrics and Gynecology

## 2023-02-22 DIAGNOSIS — N941 Unspecified dyspareunia: Secondary | ICD-10-CM

## 2023-02-22 DIAGNOSIS — N952 Postmenopausal atrophic vaginitis: Secondary | ICD-10-CM

## 2023-02-24 NOTE — Progress Notes (Signed)
72 y.o. G1P0010 Married White or Caucasian Not Hispanic or Latino female here for annual exam.  No vaginal bleeding.   Patient states that the estradiol is $84 and her medication supplement doesn't pay for it. She would like to have it compounded.  She has been using her vaginal dilators and has been able to have intercourse. Using lubrication. Not having pain.   [place] have horses, donkeys, goats, and a puppy.     Recent bought of diverticulitis.  Patient's last menstrual period was 09/12/2002.          Sexually active: Yes.    The current method of family planning is post menopausal status.    Exercising: Yes.    Gym/ health club routine includes light weights, stationary bike, and walking on track . Smoker:  no  Health Maintenance: Pap:  10/29/16 (last pap at 38)  History of abnormal Pap:  no MMG:  10/19/22  birads 2 benign. Had an u/s as well  BMD:   10/06/21 osteopenia, T score -1.7  Colonoscopy:2017 f/u 57yrs TDaP:  2019  Gardasil: NA   reports that she has never smoked. She has never used smokeless tobacco. She reports that she does not drink alcohol and does not use drugs.   Past Medical History:  Diagnosis Date   Arthritis    Arthritis of left hip 03/22/2016   CAD (coronary artery disease) 09/16/2017   CMC arthritis, thumb, degenerative 0000000   Complication of anesthesia    Coronary artery disease    Cystic breast    Essential hypertension 08/01/2017   Heart murmur    " nothing to be concerned about"   Hyperlipidemia    Hypertension    Mixed dyslipidemia 04/09/2020   Norovirus    PAF (paroxysmal atrial fibrillation) ([place]) 02/14/2018   Plantar fasciitis 2006   right   PONV (postoperative nausea and vomiting)    Primary osteoarthritis of first carpometacarpal joint of right hand 11/28/2015   Primary osteoarthritis of left hip 03/20/2016   Puncture wound    right leg   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   Supraventricular tachycardia  08/01/2017   Tachycardia    Trigger finger of right thumb 11/28/2015    Past Surgical History:  Procedure Laterality Date   COLONOSCOPY     HYSTEROSCOPY     D&C polypectomy   LEFT HEART CATH AND CORONARY ANGIOGRAPHY N/A 08/23/2017   Procedure: LEFT HEART CATH AND CORONARY ANGIOGRAPHY;  Surgeon: Belva Crome, MD;  Location: [facility];  Service: Cardiovascular;  Laterality: N/A;   SPINAL FUSION  2008   c5-7   TOTAL HIP ARTHROPLASTY Left 03/22/2016   Procedure: TOTAL HIP ARTHROPLASTY ANTERIOR APPROACH;  Surgeon: Frederik Pear, MD;  Location: [facility];  Service: Orthopedics;  Laterality: Left;    Current Outpatient Medications  Medication Sig Dispense Refill   acetaminophen (TYLENOL) 650 MG CR tablet Take 650 mg by mouth at bedtime as needed for pain.     apixaban (ELIQUIS) 5 MG TABS tablet TAKE ONE TABLET BY MOUTH AT BREAKFAST AND AT BEDTIME 180 tablet 1   atorvastatin (LIPITOR) 10 MG tablet Take 1 tablet (10 mg total) by mouth daily. TAKE ONE TABLET BY MOUTH EVERYDAY AT BEDTIME 90 tablet 1   Calcium Carbonate-Vitamin D (CALCIUM-D PO) Take 2 tablets by mouth every evening.     cyclobenzaprine (FLEXERIL) 5 MG tablet Take 5 mg by mouth as needed for muscle spasms.     Estradiol (YUVAFEM) 10  MCG TABS vaginal tablet INSERT 1 TABLET VAGINALLY TWICE WEEKLY 24 tablet 0   flecainide (TAMBOCOR) 50 MG tablet Take 1 tablet (50 mg total) by mouth 2 (two) times daily. 180 tablet 1   metoprolol succinate (TOPROL-XL) 50 MG 24 hr tablet Take 1 tablet by mouth daily.     telmisartan-hydrochlorothiazide (MICARDIS HCT) 40-12.5 MG tablet Take 1 tablet by mouth every morning.   3   No current facility-administered medications for this visit.    Family History  Problem Relation Age of Onset   Stroke Mother    Heart attack Father    Cancer Maternal Grandmother        uterine?   Diabetes Paternal Grandmother     Review of Systems  All other systems reviewed and are negative.   Exam:   BP  128/64   Pulse 72   Ht 5\' 3"  (1.6 m)   Wt 180 lb (81.6 kg)   LMP 09/12/2002   SpO2 100%   BMI 31.89 kg/m   Weight change: @WEIGHTCHANGE @ Height:   Height: 5\' 3"  (160 cm)  Ht Readings from Last 3 Encounters:  03/08/23 5\' 3"  (1.6 m)  11/26/22 5\' 4"  (1.626 m)  05/17/22 5\' 3"  (1.6 m)    General appearance: alert, cooperative and appears stated age Head: Normocephalic, without obvious abnormality, atraumatic Neck: no adenopathy, supple, symmetrical, trachea midline and thyroid normal to inspection and palpation Lungs: clear to auscultation bilaterally Cardiovascular: regular rate and rhythm Breasts: normal appearance, no masses or tenderness Abdomen: soft, non-tender; non distended,  no masses,  no organomegaly Extremities: extremities normal, atraumatic, no cyanosis or edema Skin: Skin color, texture, turgor normal. No rashes or lesions Lymph nodes: Cervical, supraclavicular, and axillary nodes normal. No abnormal inguinal nodes palpated Neurologic: Grossly normal   Pelvic: External genitalia:  no lesions              Urethra:  normal appearing urethra with no masses, tenderness or lesions              Bartholins and Skenes: normal                 Vagina: mildly atrophic appearing vagina, still with a band in her upper vagina, less tight than on prior exams              Cervix: no lesions               Bimanual Exam:  Uterus:   no masses or tenderness              Adnexa: no mass, fullness, tenderness               Rectovaginal: Confirms               Anus:  normal sphincter tone, no lesions  Gae Dry, CMA chaperoned for the exam.  1. Encounter for breast and pelvic examination Discussed breast self exam Discussed calcium and vit D intake Mammogram and colonoscopy UTD No pap needed Labs with primary  2. Vaginal atrophy - estradiol (ESTRACE) 0.1 MG/GM vaginal cream; 1 gram vaginally twice weekly  Dispense: 42.5 g; Refill: 2  3. History of osteopenia DEXA order sent  to [facility].

## 2023-02-28 ENCOUNTER — Other Ambulatory Visit: Payer: Self-pay | Admitting: Obstetrics and Gynecology

## 2023-02-28 ENCOUNTER — Other Ambulatory Visit: Payer: Self-pay

## 2023-02-28 ENCOUNTER — Telehealth: Payer: Self-pay

## 2023-02-28 DIAGNOSIS — N941 Unspecified dyspareunia: Secondary | ICD-10-CM

## 2023-02-28 DIAGNOSIS — N952 Postmenopausal atrophic vaginitis: Secondary | ICD-10-CM

## 2023-02-28 NOTE — Telephone Encounter (Signed)
JJ [name] from [place] calling [facility] in triage line regarding denied estrogen rx. Inquiring as to why it was denied.  Rx was denied on 02/22/2023--unsure of specific reason. Only note was "refill not appropriate."  Last AEX 03/03/2021, last OV (50yr med check) 01/27/2022--scheduled on 03/08/2023 (scheduled in 12/2022) Last mammo 10/12/2022- birads 0, Dx/US Lt Breast 10/19/2022- birads 2 benign  Please advise. Rx pend.

## 2023-02-28 NOTE — Telephone Encounter (Signed)
Spoke w/ Clarene Critchley @ [facility] and requested that we received pt's screening mammo report from 10/12/2022 because we received the dx imaging reports but not the screening. She voiced understanding and confirmed fax number and will send over.   Will route to provider for final review and close encounter.

## 2023-03-01 ENCOUNTER — Encounter: Payer: Self-pay | Admitting: Obstetrics and Gynecology

## 2023-03-01 MED ORDER — ESTRADIOL 10 MCG VA TABS: ORAL_TABLET | VAGINAL | 0 refills | Status: DC

## 2023-03-08 ENCOUNTER — Encounter: Payer: Self-pay | Admitting: Obstetrics and Gynecology

## 2023-03-08 ENCOUNTER — Ambulatory Visit (INDEPENDENT_AMBULATORY_CARE_PROVIDER_SITE_OTHER): Payer: Medicare Other | Admitting: Obstetrics and Gynecology

## 2023-03-08 VITALS — BP 128/64 | HR 72 | Ht 63.0 in | Wt 180.0 lb

## 2023-03-08 DIAGNOSIS — Z01419 Encounter for gynecological examination (general) (routine) without abnormal findings: Secondary | ICD-10-CM | POA: Diagnosis not present

## 2023-03-08 DIAGNOSIS — Z8739 Personal history of other diseases of the musculoskeletal system and connective tissue: Secondary | ICD-10-CM

## 2023-03-08 DIAGNOSIS — N952 Postmenopausal atrophic vaginitis: Secondary | ICD-10-CM | POA: Diagnosis not present

## 2023-03-08 MED ORDER — ESTRADIOL 0.1 MG/GM VA CREA: TOPICAL_CREAM | VAGINAL | 2 refills | Status: DC

## 2023-03-08 NOTE — Patient Instructions (Signed)

## 2023-04-02 DIAGNOSIS — L728 Other follicular cysts of the skin and subcutaneous tissue: Secondary | ICD-10-CM | POA: Diagnosis not present

## 2023-04-02 DIAGNOSIS — L820 Inflamed seborrheic keratosis: Secondary | ICD-10-CM | POA: Diagnosis not present

## 2023-04-04 DIAGNOSIS — M5136 Other intervertebral disc degeneration, lumbar region: Secondary | ICD-10-CM | POA: Diagnosis not present

## 2023-04-04 DIAGNOSIS — M546 Pain in thoracic spine: Secondary | ICD-10-CM | POA: Diagnosis not present

## 2023-04-04 DIAGNOSIS — M9902 Segmental and somatic dysfunction of thoracic region: Secondary | ICD-10-CM | POA: Diagnosis not present

## 2023-04-04 DIAGNOSIS — M9903 Segmental and somatic dysfunction of lumbar region: Secondary | ICD-10-CM | POA: Diagnosis not present

## 2023-04-04 DIAGNOSIS — M5442 Lumbago with sciatica, left side: Secondary | ICD-10-CM | POA: Diagnosis not present

## 2023-04-18 DIAGNOSIS — Z79899 Other long term (current) drug therapy: Secondary | ICD-10-CM | POA: Diagnosis not present

## 2023-04-18 DIAGNOSIS — Z0000 Encounter for general adult medical examination without abnormal findings: Secondary | ICD-10-CM | POA: Diagnosis not present

## 2023-04-18 DIAGNOSIS — Z1331 Encounter for screening for depression: Secondary | ICD-10-CM | POA: Diagnosis not present

## 2023-04-18 DIAGNOSIS — Z6832 Body mass index (BMI) 32.0-32.9, adult: Secondary | ICD-10-CM | POA: Diagnosis not present

## 2023-04-18 DIAGNOSIS — E782 Mixed hyperlipidemia: Secondary | ICD-10-CM | POA: Diagnosis not present

## 2023-04-18 DIAGNOSIS — E559 Vitamin D deficiency, unspecified: Secondary | ICD-10-CM | POA: Diagnosis not present

## 2023-04-18 LAB — LAB REPORT - SCANNED: EGFR: 60

## 2023-04-25 DIAGNOSIS — M9903 Segmental and somatic dysfunction of lumbar region: Secondary | ICD-10-CM | POA: Diagnosis not present

## 2023-04-25 DIAGNOSIS — M9902 Segmental and somatic dysfunction of thoracic region: Secondary | ICD-10-CM | POA: Diagnosis not present

## 2023-04-25 DIAGNOSIS — M546 Pain in thoracic spine: Secondary | ICD-10-CM | POA: Diagnosis not present

## 2023-04-25 DIAGNOSIS — M5442 Lumbago with sciatica, left side: Secondary | ICD-10-CM | POA: Diagnosis not present

## 2023-04-25 DIAGNOSIS — M5136 Other intervertebral disc degeneration, lumbar region: Secondary | ICD-10-CM | POA: Diagnosis not present

## 2023-05-31 ENCOUNTER — Encounter: Payer: Self-pay | Admitting: Cardiology

## 2023-05-31 ENCOUNTER — Ambulatory Visit: Payer: Medicare Other | Attending: Cardiology | Admitting: Cardiology

## 2023-05-31 VITALS — BP 134/70 | HR 67 | Ht 63.0 in | Wt 182.4 lb

## 2023-05-31 DIAGNOSIS — M9903 Segmental and somatic dysfunction of lumbar region: Secondary | ICD-10-CM | POA: Diagnosis not present

## 2023-05-31 DIAGNOSIS — E782 Mixed hyperlipidemia: Secondary | ICD-10-CM | POA: Diagnosis not present

## 2023-05-31 DIAGNOSIS — I2510 Atherosclerotic heart disease of native coronary artery without angina pectoris: Secondary | ICD-10-CM | POA: Insufficient documentation

## 2023-05-31 DIAGNOSIS — I480 Paroxysmal atrial fibrillation: Secondary | ICD-10-CM | POA: Insufficient documentation

## 2023-05-31 DIAGNOSIS — M5442 Lumbago with sciatica, left side: Secondary | ICD-10-CM | POA: Diagnosis not present

## 2023-05-31 DIAGNOSIS — M5136 Other intervertebral disc degeneration, lumbar region: Secondary | ICD-10-CM | POA: Diagnosis not present

## 2023-05-31 DIAGNOSIS — R0609 Other forms of dyspnea: Secondary | ICD-10-CM | POA: Insufficient documentation

## 2023-05-31 DIAGNOSIS — M9902 Segmental and somatic dysfunction of thoracic region: Secondary | ICD-10-CM | POA: Diagnosis not present

## 2023-05-31 DIAGNOSIS — E66811 Obesity, class 1: Secondary | ICD-10-CM

## 2023-05-31 DIAGNOSIS — E669 Obesity, unspecified: Secondary | ICD-10-CM | POA: Diagnosis not present

## 2023-05-31 DIAGNOSIS — M546 Pain in thoracic spine: Secondary | ICD-10-CM | POA: Diagnosis not present

## 2023-05-31 HISTORY — DX: Obesity, class 1: E66.811

## 2023-05-31 NOTE — Progress Notes (Signed)
Cardiology Office Note:    Date:  05/31/2023   ID:  Ashley Mcdonald, DOB Apr 29, 1951, MRN [id number]  PCP:  Marylen Ponto, MD  Cardiologist:  Garwin Brothers, MD   Referring MD: Marylen Ponto, MD    ASSESSMENT:    1. Coronary artery disease involving native coronary artery of native heart without angina pectoris   2. PAF (paroxysmal atrial fibrillation) ([facility])   3. DOE (dyspnea on exertion)   4. Mixed dyslipidemia   5. Obesity (BMI 30.0-34.9)    PLAN:    In order of problems listed above:  Coronary artery disease and dyspnea on exertion: Secondary prevention stressed to the patient.  Importance of compliance with diet medication stressed and she vocalized understanding.  Her dyspnea on exertion could be an anginal equivalent in view of coronary artery disease history so we will do a CT coronary angiography with FFR and she is agreeable. Essential hypertension: Blood pressure stable and diet was emphasized.  Lifestyle modification urged. Mixed dyslipidemia: On lipid-lowering medications.  Labs reviewed from primary care office and found to be fine. Paroxysmal atrial fibrillation:I discussed with the patient atrial fibrillation, disease process. Management and therapy including rate and rhythm control, anticoagulation benefits and potential risks were discussed extensively with the patient. Patient had multiple questions which were answered to patient's satisfaction. Obesity: Weight reduction stressed and diet was emphasized and she promises to do better. Patient will be seen in follow-up appointment in 6 months or earlier if the patient has any concerns.    Medication Adjustments/Labs and Tests Ordered: Current medicines are reviewed at length with the patient today.  Concerns regarding medicines are outlined above.  Orders Placed This Encounter  Procedures   CT CORONARY MORPH W/CTA COR W/SCORE W/CA W/CM &/OR WO/CM   Basic metabolic panel   EKG 12-Lead   No orders of the defined  types were placed in this encounter.    No chief complaint on file.    History of Present Illness:    Ashley Mcdonald is a 72 y.o. female.  Patient has past medical history of coronary plaque, atherosclerotic vascular disease, essential hypertension, mixed dyslipidemia, paroxysmal atrial fibrillation and obesity.  She denies any problems at this time and takes care of activities of daily living.  No chest pain orthopnea or PND.  She leads a sedentary lifestyle.  At the time of my evaluation, the patient is alert awake oriented and in no distress.  She is now giving history of dyspnea on exertion.  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   Complication of anesthesia    Coronary artery disease    Cystic breast    Essential hypertension 08/01/2017   Heart murmur    " nothing to be concerned about"   Hyperlipidemia    Hypertension    Mixed dyslipidemia 04/09/2020   Norovirus    PAF (paroxysmal atrial fibrillation) ([facility]) 02/14/2018   Plantar fasciitis 2006   right   PONV (postoperative nausea and vomiting)    Primary osteoarthritis of first carpometacarpal joint of right hand 11/28/2015   Primary osteoarthritis of left hip 03/20/2016   Puncture wound    right leg   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   Supraventricular tachycardia 08/01/2017   Tachycardia    Trigger finger of right thumb 11/28/2015    Past Surgical History:  Procedure Laterality  Date   COLONOSCOPY     HYSTEROSCOPY     D&C polypectomy   LEFT HEART CATH AND CORONARY ANGIOGRAPHY N/A 08/23/2017   Procedure: LEFT HEART CATH AND CORONARY ANGIOGRAPHY;  Surgeon: Lyn Records, MD;  Location: [facility];  Service: Cardiovascular;  Laterality: N/A;   SPINAL FUSION  2008   c5-7   TOTAL HIP ARTHROPLASTY Left 03/22/2016   Procedure: TOTAL HIP ARTHROPLASTY ANTERIOR APPROACH;   Surgeon: Gean Birchwood, MD;  Location: [facility];  Service: Orthopedics;  Laterality: Left;    Current Medications: Current Meds  Medication Sig   acetaminophen (TYLENOL) 650 MG CR tablet Take 650 mg by mouth at bedtime as needed for pain.   apixaban (ELIQUIS) 5 MG TABS tablet TAKE ONE TABLET BY MOUTH AT BREAKFAST AND AT BEDTIME   atorvastatin (LIPITOR) 10 MG tablet Take 1 tablet (10 mg total) by mouth daily. TAKE ONE TABLET BY MOUTH EVERYDAY AT BEDTIME   Calcium Carbonate-Vitamin D (CALCIUM-D PO) Take 2 tablets by mouth every evening.   Cranberry 600 MG TABS Take 600 mg by mouth daily.   cyclobenzaprine (FLEXERIL) 5 MG tablet Take 5 mg by mouth as needed for muscle spasms.   estradiol (ESTRACE) 0.1 MG/GM vaginal cream 1 gram vaginally twice weekly   flecainide (TAMBOCOR) 50 MG tablet Take 1 tablet (50 mg total) by mouth 2 (two) times daily.   Glucosamine-Chondroit-Vit C-Mn (GLUCOSAMINE CHONDR 1500 COMPLX PO) Take 3 tablets by mouth daily.   hydrOXYzine (ATARAX) 25 MG tablet Take 50 mg by mouth at bedtime as needed (sleep).   metoprolol succinate (TOPROL-XL) 50 MG 24 hr tablet Take 1 tablet by mouth daily.   telmisartan-hydrochlorothiazide (MICARDIS HCT) 40-12.5 MG tablet Take 1 tablet by mouth every morning.      Allergies:   Nsaids   Social History   Socioeconomic History   Marital status: Married    Spouse name: Not on file   Number of children: Not on file   Years of education: Not on file   Highest education level: Not on file  Occupational History   Not on file  Tobacco Use   Smoking status: Never   Smokeless tobacco: Never  Vaping Use   Vaping Use: Never used  Substance and Sexual Activity   Alcohol use: No    Alcohol/week: 0.0 standard drinks of alcohol   Drug use: No   Sexual activity: Not Currently    Partners: Male    Birth control/protection: Post-menopausal  Other Topics Concern   Not on file  Social History Narrative   Not on file   Social Determinants of  Health   Financial Resource Strain: Not on file  Food Insecurity: Not on file  Transportation Needs: Not on file  Physical Activity: Not on file  Stress: Not on file  Social Connections: Not on file     Family History: The patient's family history includes Cancer in her maternal grandmother; Diabetes in her paternal grandmother; Heart attack in her father; Stroke in her mother.  ROS:   Please see the history of present illness.    All other systems reviewed and are negative.  EKGs/Labs/Other Studies Reviewed:    The following studies were reviewed today: EKG reveals sinus rhythm and nonspecific ST changes   Recent Labs: 11/26/2022: BUN 16; Creatinine, Ser 0.89; Hemoglobin 13.8; Magnesium 2.0; Platelets 193; Potassium 3.9; Sodium 138; TSH 2.260  Recent Lipid Panel    Component Value Date/Time   CHOL 116 04/24/2021 0852  TRIG 163 (H) 04/24/2021 0852   HDL 32 (L) 04/24/2021 0852   CHOLHDL 3.6 04/24/2021 0852   LDLCALC 56 04/24/2021 0852    Physical Exam:    VS:  BP 134/70   Pulse 67   Ht 5\' 3"  (1.6 m)   Wt 182 lb 6.4 oz (82.7 kg)   LMP 09/12/2002   SpO2 95%   BMI 32.31 kg/m     Wt Readings from Last 3 Encounters:  05/31/23 182 lb 6.4 oz (82.7 kg)  03/08/23 180 lb (81.6 kg)  11/26/22 177 lb 6.4 oz (80.5 kg)     GEN: Patient is in no acute distress HEENT: Normal NECK: No JVD; No carotid bruits LYMPHATICS: No lymphadenopathy CARDIAC: Hear sounds regular, 2/6 systolic murmur at the apex. RESPIRATORY:  Clear to auscultation without rales, wheezing or rhonchi  ABDOMEN: Soft, non-tender, non-distended MUSCULOSKELETAL:  No edema; No deformity  SKIN: Warm and dry NEUROLOGIC:  Alert and oriented x 3 PSYCHIATRIC:  Normal affect   Signed, Garwin Brothers, MD  05/31/2023 2:31 PM    [facility]

## 2023-05-31 NOTE — Patient Instructions (Signed)
Medication Instructions:  Your physician recommends that you continue on your current medications as directed. Please refer to the Current Medication list given to you today.   *If you need a refill on your cardiac medications before your next appointment, please call your pharmacy*   Lab Work: Your physician recommends that you have a BMP today in the office.  If you have labs (blood work) drawn today and your tests are completely normal, you will receive your results only by: MyChart Message (if you have MyChart) OR A paper copy in the mail If you have any lab test that is abnormal or we need to change your treatment, we will call you to review the results.   Testing/Procedures:   Your cardiac CT will be scheduled at one of the below locations:   [facility] [address] [place], [place] [address] [phone]  If scheduled at [facility], please arrive at the [facility] Entrance (Entrance C2) of [facility] 30 minutes prior to test start time. You can use the FREE valet parking offered at entrance C (encouraged to control the heart rate for the test)  Proceed to the [facility] Radiology Department (first floor) to check-in and test prep.  All radiology patients and guests should use entrance C2 at [facility], accessed from [facility], even though the hospital's physical address listed is [address].     Please follow these instructions carefully (unless otherwise directed):  On the Night Before the Test: Be sure to Drink plenty of water. Do not consume any caffeinated/decaffeinated beverages or chocolate 12 hours prior to your test. Do not take any antihistamines 12 hours prior to your test.  On the Day of the Test: Drink plenty of water until 1 hour prior to the test. Do not eat any food 1 hour prior to test. You may take your regular medications prior to the test.  Take metoprolol (Toprol  XL) two hours prior to test. Take 1 1/2 tablets. FEMALES- please wear underwire-free bra if available, avoid dresses & tight clothing      After the Test: Drink plenty of water. After receiving IV contrast, you may experience a mild flushed feeling. This is normal. On occasion, you may experience a mild rash up to 24 hours after the test. This is not dangerous. If this occurs, you can take Benadryl 25 mg and increase your fluid intake. If you experience trouble breathing, this can be serious. If it is severe call 911 IMMEDIATELY. If it is mild, please call our office. If you take any of these medications: Glipizide/Metformin, Avandament, Glucavance, please do not take 48 hours after completing test unless otherwise instructed.  We will call to schedule your test 2-4 weeks out understanding that some insurance companies will need an authorization prior to the service being performed.   For non-scheduling related questions, please contact the cardiac imaging nurse navigator should you have any questions/concerns: Rockwell Alexandria, Cardiac Imaging Nurse Navigator Larey Brick, Cardiac Imaging Nurse Navigator [facility] Direct Office Dial: [phone]   For scheduling needs, including cancellations and rescheduling, please call [place], [phone].   Your next appointment:   6 month(s)  The format for your next appointment:   In Person  Provider:   Belva Crome, MD   Other Instructions Cardiac CT Angiogram A cardiac CT angiogram is a procedure to look at the heart and the area around the heart. It may be  done to help find the cause of chest pains or other symptoms of heart disease. During this procedure, a substance called contrast dye is injected into the blood vessels in the area to be checked. A large X-ray machine, called a CT scanner, then takes detailed pictures of the heart and the surrounding area. The procedure is also sometimes called a coronary  CT angiogram, coronary artery scanning, or CTA. A cardiac CT angiogram allows the health care provider to see how well blood is flowing to and from the heart. The health care provider will be able to see if there are any problems, such as: Blockage or narrowing of the coronary arteries in the heart. Fluid around the heart. Signs of weakness or disease in the muscles, valves, and tissues of the heart. Tell a health care provider about: Any allergies you have. This is especially important if you have had a previous allergic reaction to contrast dye. All medicines you are taking, including vitamins, herbs, eye drops, creams, and over-the-counter medicines. Any blood disorders you have. Any surgeries you have had. Any medical conditions you have. Whether you are pregnant or may be pregnant. Any anxiety disorders, chronic pain, or other conditions you have that may increase your stress or prevent you from lying still. What are the risks? Generally, this is a safe procedure. However, problems may occur, including: Bleeding. Infection. Allergic reactions to medicines or dyes. Damage to other structures or organs. Kidney damage from the contrast dye that is used. Increased risk of cancer from radiation exposure. This risk is low. Talk with your health care provider about: The risks and benefits of testing. How you can receive the lowest dose of radiation. What happens before the procedure? Wear comfortable clothing and remove any jewelry, glasses, dentures, and hearing aids. Follow instructions from your health care provider about eating and drinking. This may include: For 12 hours before the procedure -- avoid caffeine. This includes tea, coffee, soda, energy drinks, and diet pills. Drink plenty of water or other fluids that do not have caffeine in them. Being well hydrated can prevent complications. For 4-6 hours before the procedure -- stop eating and drinking. The contrast dye can cause  nausea, but this is less likely if your stomach is empty. Ask your health care provider about changing or stopping your regular medicines. This is especially important if you are taking diabetes medicines, blood thinners, or medicines to treat problems with erections (erectile dysfunction). What happens during the procedure?  Hair on your chest may need to be removed so that small sticky patches called electrodes can be placed on your chest. These will transmit information that helps to monitor your heart during the procedure. An IV will be inserted into one of your veins. You might be given a medicine to control your heart rate during the procedure. This will help to ensure that good images are obtained. You will be asked to lie on an exam table. This table will slide in and out of the CT machine during the procedure. Contrast dye will be injected into the IV. You might feel warm, or you may get a metallic taste in your mouth. You will be given a medicine called nitroglycerin. This will relax or dilate the arteries in your heart. The table that you are lying on will move into the CT machine tunnel for the scan. The person running the machine will give you instructions while the scans are being done. You may be asked to: Keep your arms above  your head. Hold your breath. Stay very still, even if the table is moving. When the scanning is complete, you will be moved out of the machine. The IV will be removed. The procedure may vary among health care providers and hospitals. What can I expect after the procedure? After your procedure, it is common to have: A metallic taste in your mouth from the contrast dye. A feeling of warmth. A headache from the nitroglycerin. Follow these instructions at home: Take over-the-counter and prescription medicines only as told by your health care provider. If you are told, drink enough fluid to keep your urine pale yellow. This will help to flush the contrast dye  out of your body. Most people can return to their normal activities right after the procedure. Ask your health care provider what activities are safe for you. It is up to you to get the results of your procedure. Ask your health care provider, or the department that is doing the procedure, when your results will be ready. Keep all follow-up visits as told by your health care provider. This is important. Contact a health care provider if: You have any symptoms of allergy to the contrast dye. These include: Shortness of breath. Rash or hives. A racing heartbeat. Summary A cardiac CT angiogram is a procedure to look at the heart and the area around the heart. It may be done to help find the cause of chest pains or other symptoms of heart disease. During this procedure, a large X-ray machine, called a CT scanner, takes detailed pictures of the heart and the surrounding area after a contrast dye has been injected into blood vessels in the area. Ask your health care provider about changing or stopping your regular medicines before the procedure. This is especially important if you are taking diabetes medicines, blood thinners, or medicines to treat erectile dysfunction. If you are told, drink enough fluid to keep your urine pale yellow. This will help to flush the contrast dye out of your body. This information is not intended to replace advice given to you by your health care provider. Make sure you discuss any questions you have with your health care provider. Document Revised: 07/25/2019 Document Reviewed: 07/25/2019 Elsevier Patient Education  2020 [employer].

## 2023-06-01 LAB — BASIC METABOLIC PANEL
BUN/Creatinine Ratio: 11 — ABNORMAL LOW (ref 12–28)
BUN: 10 mg/dL (ref 8–27)
CO2: 27 mmol/L (ref 20–29)
Calcium: 9.4 mg/dL (ref 8.7–10.3)
Chloride: 103 mmol/L (ref 96–106)
Creatinine, Ser: 0.94 mg/dL (ref 0.57–1.00)
Glucose: 183 mg/dL — ABNORMAL HIGH (ref 70–99)
Potassium: 4.3 mmol/L (ref 3.5–5.2)
Sodium: 143 mmol/L (ref 134–144)
eGFR: 65 mL/min/{1.73_m2} (ref >59)

## 2023-06-08 ENCOUNTER — Encounter (HOSPITAL_COMMUNITY): Payer: Self-pay

## 2023-06-09 ENCOUNTER — Telehealth (HOSPITAL_COMMUNITY): Payer: Self-pay | Admitting: Emergency Medicine

## 2023-06-09 NOTE — Telephone Encounter (Signed)
Reaching out to patient to offer assistance regarding upcoming cardiac imaging study; pt verbalizes understanding of appt date/time, parking situation and where to check in, pre-test NPO status and medications ordered, and verified current allergies; name and call back number provided for further questions should they arise Citlali Gautney RN Navigator Cardiac Imaging [facility] [phone] office [phone] cell 

## 2023-06-09 NOTE — Telephone Encounter (Signed)
Attempted to call patient regarding upcoming cardiac CT appointment. °Left message on voicemail with name and callback number °Kesi Perrow RN Navigator Cardiac Imaging °[facility] °[phone] Office °[phone] Cell ° °

## 2023-06-10 ENCOUNTER — Ambulatory Visit (HOSPITAL_COMMUNITY)
Admission: RE | Admit: 2023-06-10 | Discharge: 2023-06-10 | Disposition: A | Discharge status: Home / Self Care | Payer: Medicare Other | Source: Ambulatory Visit | Attending: Cardiology | Admitting: Cardiology

## 2023-06-10 DIAGNOSIS — I2510 Atherosclerotic heart disease of native coronary artery without angina pectoris: Secondary | ICD-10-CM

## 2023-06-10 DIAGNOSIS — R0609 Other forms of dyspnea: Secondary | ICD-10-CM

## 2023-06-10 MED ORDER — NITROGLYCERIN 0.4 MG SL SUBL
0.8000 mg | SUBLINGUAL_TABLET | Freq: Once | SUBLINGUAL | Status: AC
  Administered 2023-06-10: 0.8 mg via SUBLINGUAL

## 2023-06-10 MED ORDER — IOHEXOL 350 MG/ML SOLN
95.0000 mL | Freq: Once | INTRAVENOUS | Status: AC | PRN
  Administered 2023-06-10: 95 mL via INTRAVENOUS

## 2023-06-10 MED ORDER — NITROGLYCERIN 0.4 MG SL SUBL
SUBLINGUAL_TABLET | SUBLINGUAL | Status: AC
  Filled 2023-06-10: qty 2

## 2023-06-10 NOTE — Progress Notes (Signed)
Pt verbalized understanding of discharge instructions; opportunity for questions provided °

## 2023-06-26 ENCOUNTER — Other Ambulatory Visit: Payer: Self-pay | Admitting: Cardiology

## 2023-07-12 DIAGNOSIS — M5136 Other intervertebral disc degeneration, lumbar region: Secondary | ICD-10-CM | POA: Diagnosis not present

## 2023-07-12 DIAGNOSIS — M9902 Segmental and somatic dysfunction of thoracic region: Secondary | ICD-10-CM | POA: Diagnosis not present

## 2023-07-12 DIAGNOSIS — M9903 Segmental and somatic dysfunction of lumbar region: Secondary | ICD-10-CM | POA: Diagnosis not present

## 2023-07-12 DIAGNOSIS — M5442 Lumbago with sciatica, left side: Secondary | ICD-10-CM | POA: Diagnosis not present

## 2023-07-12 DIAGNOSIS — M546 Pain in thoracic spine: Secondary | ICD-10-CM | POA: Diagnosis not present

## 2023-07-26 DIAGNOSIS — H1013 Acute atopic conjunctivitis, bilateral: Secondary | ICD-10-CM | POA: Diagnosis not present

## 2023-07-29 ENCOUNTER — Telehealth: Payer: Self-pay

## 2023-07-29 DIAGNOSIS — M5432 Sciatica, left side: Secondary | ICD-10-CM | POA: Diagnosis not present

## 2023-07-29 NOTE — Patient Outreach (Signed)
  Care Coordination   Initial Visit Note   07/29/2023 Name: Ashley Mcdonald MRN: [id number] DOB: Oct 24, 1951  Ashley Mcdonald is a 72 y.o. year old female who sees Marylen Ponto, MD for primary care. I spoke with  Ashley Mcdonald by phone today.  What matters to the patients health and wellness today?  Placed a call to patient to review and offer [facility] care coordination program. Patient reports that she would like a call back next week.  Patient unable to schedule a time and date, states the just call back.    SDOH assessments and interventions completed:  No     Care Coordination Interventions:  Yes, provided   Follow up plan:  will attempt 2nd call next week per patients request.     Encounter Outcome:  Pt. Visit Completed   Rowe Pavy, RN, BSN, CEN [facility] Coordinator [phone]

## 2023-08-09 ENCOUNTER — Other Ambulatory Visit: Payer: Self-pay | Admitting: Cardiology

## 2023-08-09 DIAGNOSIS — I480 Paroxysmal atrial fibrillation: Secondary | ICD-10-CM

## 2023-08-09 DIAGNOSIS — M5442 Lumbago with sciatica, left side: Secondary | ICD-10-CM | POA: Diagnosis not present

## 2023-08-09 DIAGNOSIS — M25552 Pain in left hip: Secondary | ICD-10-CM | POA: Diagnosis not present

## 2023-08-09 NOTE — Telephone Encounter (Signed)
Prescription refill request for Eliquis received. Indication: Afib  Last office visit: 05/31/23 (Revankar)  Scr: 0.94 (05/31/23)  Age: 72 Weight: 82.7kg  Appropriate dose. Refill sent.

## 2023-08-12 ENCOUNTER — Other Ambulatory Visit: Payer: Self-pay | Admitting: Cardiology

## 2023-08-12 DIAGNOSIS — I480 Paroxysmal atrial fibrillation: Secondary | ICD-10-CM

## 2023-08-17 DIAGNOSIS — M545 Low back pain, unspecified: Secondary | ICD-10-CM | POA: Diagnosis not present

## 2023-08-18 DIAGNOSIS — M545 Low back pain, unspecified: Secondary | ICD-10-CM | POA: Diagnosis not present

## 2023-08-19 DIAGNOSIS — M545 Low back pain, unspecified: Secondary | ICD-10-CM | POA: Diagnosis not present

## 2023-08-22 DIAGNOSIS — M545 Low back pain, unspecified: Secondary | ICD-10-CM | POA: Diagnosis not present

## 2023-08-22 DIAGNOSIS — Z6832 Body mass index (BMI) 32.0-32.9, adult: Secondary | ICD-10-CM | POA: Diagnosis not present

## 2023-08-22 DIAGNOSIS — R300 Dysuria: Secondary | ICD-10-CM | POA: Diagnosis not present

## 2023-08-24 DIAGNOSIS — M545 Low back pain, unspecified: Secondary | ICD-10-CM | POA: Diagnosis not present

## 2023-08-26 DIAGNOSIS — M545 Low back pain, unspecified: Secondary | ICD-10-CM | POA: Diagnosis not present

## 2023-08-29 DIAGNOSIS — M545 Low back pain, unspecified: Secondary | ICD-10-CM | POA: Diagnosis not present

## 2023-08-31 DIAGNOSIS — M545 Low back pain, unspecified: Secondary | ICD-10-CM | POA: Diagnosis not present

## 2023-09-01 DIAGNOSIS — M545 Low back pain, unspecified: Secondary | ICD-10-CM | POA: Diagnosis not present

## 2023-09-02 DIAGNOSIS — I10 Essential (primary) hypertension: Secondary | ICD-10-CM | POA: Diagnosis not present

## 2023-09-02 DIAGNOSIS — Z6832 Body mass index (BMI) 32.0-32.9, adult: Secondary | ICD-10-CM | POA: Diagnosis not present

## 2023-09-02 DIAGNOSIS — G4700 Insomnia, unspecified: Secondary | ICD-10-CM | POA: Diagnosis not present

## 2023-10-04 DIAGNOSIS — N3090 Cystitis, unspecified without hematuria: Secondary | ICD-10-CM | POA: Diagnosis not present

## 2023-10-04 DIAGNOSIS — N3000 Acute cystitis without hematuria: Secondary | ICD-10-CM | POA: Diagnosis not present

## 2023-10-14 DIAGNOSIS — Z96642 Presence of left artificial hip joint: Secondary | ICD-10-CM | POA: Diagnosis not present

## 2023-10-14 DIAGNOSIS — M8588 Other specified disorders of bone density and structure, other site: Secondary | ICD-10-CM | POA: Diagnosis not present

## 2023-10-14 DIAGNOSIS — Z1231 Encounter for screening mammogram for malignant neoplasm of breast: Secondary | ICD-10-CM | POA: Diagnosis not present

## 2023-10-17 DIAGNOSIS — M47816 Spondylosis without myelopathy or radiculopathy, lumbar region: Secondary | ICD-10-CM | POA: Diagnosis not present

## 2023-10-18 DIAGNOSIS — Z6832 Body mass index (BMI) 32.0-32.9, adult: Secondary | ICD-10-CM | POA: Diagnosis not present

## 2023-10-18 DIAGNOSIS — G4700 Insomnia, unspecified: Secondary | ICD-10-CM | POA: Diagnosis not present

## 2023-10-18 DIAGNOSIS — R7301 Impaired fasting glucose: Secondary | ICD-10-CM | POA: Diagnosis not present

## 2023-10-18 DIAGNOSIS — E782 Mixed hyperlipidemia: Secondary | ICD-10-CM | POA: Diagnosis not present

## 2023-10-18 LAB — LAB REPORT - SCANNED
A1c: 6.8
EGFR: 60
TSH: 1.72 (ref 0.41–5.90)

## 2023-10-24 ENCOUNTER — Encounter: Payer: Self-pay | Admitting: Obstetrics and Gynecology

## 2023-11-01 ENCOUNTER — Encounter (HOSPITAL_BASED_OUTPATIENT_CLINIC_OR_DEPARTMENT_OTHER): Payer: Self-pay | Admitting: Obstetrics & Gynecology

## 2023-11-01 DIAGNOSIS — M47816 Spondylosis without myelopathy or radiculopathy, lumbar region: Secondary | ICD-10-CM | POA: Diagnosis not present

## 2023-11-14 DIAGNOSIS — M47816 Spondylosis without myelopathy or radiculopathy, lumbar region: Secondary | ICD-10-CM | POA: Diagnosis not present

## 2023-12-22 DIAGNOSIS — E119 Type 2 diabetes mellitus without complications: Secondary | ICD-10-CM | POA: Diagnosis not present

## 2023-12-22 DIAGNOSIS — E118 Type 2 diabetes mellitus with unspecified complications: Secondary | ICD-10-CM | POA: Diagnosis not present

## 2023-12-22 DIAGNOSIS — Z713 Dietary counseling and surveillance: Secondary | ICD-10-CM | POA: Diagnosis not present

## 2024-01-11 DIAGNOSIS — D2239 Melanocytic nevi of other parts of face: Secondary | ICD-10-CM | POA: Diagnosis not present

## 2024-01-11 DIAGNOSIS — L814 Other melanin hyperpigmentation: Secondary | ICD-10-CM | POA: Diagnosis not present

## 2024-01-11 DIAGNOSIS — L820 Inflamed seborrheic keratosis: Secondary | ICD-10-CM | POA: Diagnosis not present

## 2024-01-11 DIAGNOSIS — L570 Actinic keratosis: Secondary | ICD-10-CM | POA: Diagnosis not present

## 2024-01-11 DIAGNOSIS — L578 Other skin changes due to chronic exposure to nonionizing radiation: Secondary | ICD-10-CM | POA: Diagnosis not present

## 2024-01-11 DIAGNOSIS — D225 Melanocytic nevi of trunk: Secondary | ICD-10-CM | POA: Diagnosis not present

## 2024-01-11 DIAGNOSIS — D485 Neoplasm of uncertain behavior of skin: Secondary | ICD-10-CM | POA: Diagnosis not present

## 2024-01-13 ENCOUNTER — Other Ambulatory Visit: Payer: Self-pay | Admitting: Cardiology

## 2024-01-18 DIAGNOSIS — E119 Type 2 diabetes mellitus without complications: Secondary | ICD-10-CM | POA: Diagnosis not present

## 2024-01-18 DIAGNOSIS — Z713 Dietary counseling and surveillance: Secondary | ICD-10-CM | POA: Diagnosis not present

## 2024-02-01 DIAGNOSIS — E119 Type 2 diabetes mellitus without complications: Secondary | ICD-10-CM | POA: Diagnosis not present

## 2024-02-01 DIAGNOSIS — Z713 Dietary counseling and surveillance: Secondary | ICD-10-CM | POA: Diagnosis not present

## 2024-02-03 ENCOUNTER — Other Ambulatory Visit: Payer: Self-pay

## 2024-02-03 DIAGNOSIS — I480 Paroxysmal atrial fibrillation: Secondary | ICD-10-CM

## 2024-02-07 ENCOUNTER — Ambulatory Visit: Payer: Medicare Other | Attending: Cardiology | Admitting: Cardiology

## 2024-02-07 ENCOUNTER — Encounter: Payer: Self-pay | Admitting: Cardiology

## 2024-02-07 VITALS — BP 132/70 | HR 60 | Ht 63.0 in | Wt 183.2 lb

## 2024-02-07 DIAGNOSIS — I2510 Atherosclerotic heart disease of native coronary artery without angina pectoris: Secondary | ICD-10-CM

## 2024-02-07 DIAGNOSIS — E66811 Obesity, class 1: Secondary | ICD-10-CM

## 2024-02-07 DIAGNOSIS — E782 Mixed hyperlipidemia: Secondary | ICD-10-CM

## 2024-02-07 DIAGNOSIS — Z131 Encounter for screening for diabetes mellitus: Secondary | ICD-10-CM

## 2024-02-07 DIAGNOSIS — I10 Essential (primary) hypertension: Secondary | ICD-10-CM | POA: Diagnosis not present

## 2024-02-07 DIAGNOSIS — Z1321 Encounter for screening for nutritional disorder: Secondary | ICD-10-CM | POA: Diagnosis not present

## 2024-02-07 DIAGNOSIS — I480 Paroxysmal atrial fibrillation: Secondary | ICD-10-CM | POA: Diagnosis not present

## 2024-02-07 NOTE — Patient Instructions (Addendum)
 Medication Instructions:  Your physician recommends that you continue on your current medications as directed. Please refer to the Current Medication list given to you today.  *If you need a refill on your cardiac medications before your next appointment, please call your pharmacy*   Lab Work: Your physician recommends that you return for lab work in: the next few days for CMP, CBC, TSH, A1C and lipids You need to have labs done when you are fasting.  You can come Monday through Friday 8:30 am to 12:00 pm and 1:15 to 4:30. You do not need to make an appointment as the order has already been placed.   If you have labs (blood work) drawn today and your tests are completely normal, you will receive your results only by: MyChart Message (if you have MyChart) OR A paper copy in the mail If you have any lab test that is abnormal or we need to change your treatment, we will call you to review the results.   Testing/Procedures: None ordered   Follow-Up: At [facility], you and your health needs are our priority.  As part of our continuing mission to provide you with exceptional heart care, we have created designated Provider Care Teams.  These Care Teams include your primary Cardiologist (physician) and Advanced Practice Providers (APPs -  Physician Assistants and Nurse Practitioners) who all work together to provide you with the care you need, when you need it.  We recommend signing up for the patient portal called "MyChart".  Sign up information is provided on this After Visit Summary.  MyChart is used to connect with patients for Virtual Visits (Telemedicine).  Patients are able to view lab/test results, encounter notes, upcoming appointments, etc.  Non-urgent messages can be sent to your provider as well.   To learn more about what you can do with MyChart, go to [email or web address].    Your next appointment:   6 month(s)  The format for your next appointment:   In  Person  Provider:   Belva Crome, MD    Other Instructions none  Important Information About Sugar

## 2024-02-07 NOTE — Progress Notes (Signed)
 Cardiology Office Note:    Date:  02/07/2024   ID:  Ashley Mcdonald, DOB 1951/03/10, MRN [id number]  PCP:  Ashley Ponto, MD  Cardiologist:  Ashley Brothers, MD   Referring MD: Ashley Ponto, MD    ASSESSMENT:    1. Coronary artery disease involving native coronary artery of native heart without angina pectoris   2. Essential hypertension   3. PAF (paroxysmal atrial fibrillation) (HCC)   4. Mixed dyslipidemia   5. Obesity (BMI 30.0-34.9)    PLAN:    In order of problems listed above:  Coronary artery disease: Secondary prevention stressed to the patient.  Importance of compliance with diet medication stressed and she vocalized understanding.  She was advised to walk at least half an hour a day on a daily basis. Paroxysmal atrial fibrillation:I discussed with the patient atrial fibrillation, disease process. Management and therapy including rate and rhythm control, anticoagulation benefits and potential risks were discussed extensively with the patient. Patient had multiple questions which were answered to patient's satisfaction. Essential hypertension: Blood pressure stable and diet was emphasized. Mixed dyslipidemia on lipid-lowering medications followed by primary care.  She wants to come for blood work in the next few days.  He will come fasting Elevated blood sugars post steroid therapy: Will do blood work including hemoglobin A1c.  She request vitamin D and we will do this for her.  Obesity: Weight reduction stressed diet emphasized.  Risks of obesity explained and she promises to do better. Patient will be seen in follow-up appointment in 6 months or earlier if the patient has any concerns.    Medication Adjustments/Labs and Tests Ordered: Current medicines are reviewed at length with the patient today.  Concerns regarding medicines are outlined above.  Orders Placed This Encounter  Procedures   EKG 12-Lead   No orders of the defined types were placed in this  encounter.    No chief complaint on file.    History of Present Illness:    [name] is a 73 y.o. female.  Patient has past medical history of coronary artery disease, paroxysmal atrial fibrillation, essential hypertension, mixed dyslipidemia.  Her blood sugars were off because of steroid intake because of orthopedic issues involving her back.  Subsequently that has been discontinued and she is feeling fine.  No chest pain orthopnea or PND.  At the time of my evaluation, the patient is alert awake oriented and in no distress.  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   Complication of anesthesia    Coronary artery disease    Cystic breast    Essential hypertension 08/01/2017   Heart murmur    " nothing to be concerned about"   Hyperlipidemia    Hypertension    Mixed dyslipidemia 04/09/2020   Norovirus    Obesity (BMI 30.0-34.9) 05/31/2023   Osteopenia 2024   hip   PAF (paroxysmal atrial fibrillation) (HCC) 02/14/2018   Plantar fasciitis 2006   right   PONV (postoperative nausea and vomiting)    Primary osteoarthritis of first carpometacarpal joint of right hand 11/28/2015   Primary osteoarthritis of left hip 03/20/2016   Puncture wound    right leg   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   Supraventricular tachycardia (HCC) 08/01/2017   Tachycardia    Trigger finger of right thumb 11/28/2015    Past Surgical  History:  Procedure Laterality Date   COLONOSCOPY     HYSTEROSCOPY     D&C polypectomy   LEFT HEART CATH AND CORONARY ANGIOGRAPHY N/A 08/23/2017   Procedure: LEFT HEART CATH AND CORONARY ANGIOGRAPHY;  Surgeon: Lyn Records, MD;  Location: [facility] INVASIVE CV LAB;  Service: Cardiovascular;  Laterality: N/A;   SPINAL FUSION  2008   c5-7   TOTAL HIP ARTHROPLASTY Left 03/22/2016   Procedure: TOTAL HIP ARTHROPLASTY ANTERIOR  APPROACH;  Surgeon: Gean Birchwood, MD;  Location: [facility] OR;  Service: Orthopedics;  Laterality: Left;    Current Medications: Current Meds  Medication Sig   acetaminophen (TYLENOL) 650 MG CR tablet Take 650 mg by mouth at bedtime as needed for pain.   apixaban (ELIQUIS) 5 MG TABS tablet Take 1 tablet (5 mg total) by mouth 2 (two) times daily.   atorvastatin (LIPITOR) 10 MG tablet Take 1 tablet (10 mg total) by mouth at bedtime.   Cranberry 600 MG TABS Take 600 mg by mouth daily.   cyclobenzaprine (FLEXERIL) 5 MG tablet Take 5 mg by mouth as needed for muscle spasms.   estradiol (ESTRACE) 0.1 MG/GM vaginal cream 1 gram vaginally twice weekly   flecainide (TAMBOCOR) 50 MG tablet Take 1 tablet (50 mg total) by mouth 2 (two) times daily.   Glucosamine-Chondroit-Vit C-Mn (GLUCOSAMINE CHONDR 1500 COMPLX PO) Take 3 tablets by mouth daily.   hydrOXYzine (ATARAX) 25 MG tablet Take 50 mg by mouth at bedtime as needed (sleep).   metoprolol succinate (TOPROL-XL) 50 MG 24 hr tablet Take 1 tablet by mouth daily.   Multiple Minerals-Vitamins (CITRACAL MAXIMUM PLUS) TABS Take 1 tablet by mouth 2 (two) times daily.   telmisartan-hydrochlorothiazide (MICARDIS HCT) 40-12.5 MG tablet Take 1 tablet by mouth every morning.      Allergies:   Nsaids   Social History   Socioeconomic History   Marital status: Married    Spouse name: Not on file   Number of children: Not on file   Years of education: Not on file   Highest education level: Not on file  Occupational History   Not on file  Tobacco Use   Smoking status: Never   Smokeless tobacco: Never  Vaping Use   Vaping status: Never Used  Substance and Sexual Activity   Alcohol use: No    Alcohol/week: 0.0 standard drinks of alcohol   Drug use: No   Sexual activity: Not Currently    Partners: Male    Birth control/protection: Post-menopausal  Other Topics Concern   Not on file  Social History Narrative   Not on file   Social Drivers of Health    Financial Resource Strain: Not on file  Food Insecurity: Not on file  Transportation Needs: Not on file  Physical Activity: Not on file  Stress: Not on file  Social Connections: Not on file     Family History: The patient's family history includes Cancer in her maternal grandmother; Diabetes in her paternal grandmother; Heart attack in her father; Stroke in her mother.  ROS:   Please see the history of present illness.    All other systems reviewed and are negative.  EKGs/Labs/Other Studies Reviewed:    The following studies were reviewed today: .Marland KitchenEKG Interpretation Date/Time:  Tuesday February 07 2024 14:15:14 EST Ventricular Rate:  60 PR Interval:  160 QRS Duration:  78 QT Interval:  448 QTC Calculation: 448 R Axis:   14  Text Interpretation: Normal sinus rhythm Cannot rule out Anterior infarct ,  age undetermined T wave abnormality, consider lateral ischemia Abnormal ECG When compared with ECG of 23-Aug-2017 11:47, No significant change was found Confirmed by Belva Crome [phone]) on 02/07/2024 2:28:39 PM     Recent Labs: 05/31/2023: BUN 10; Creatinine, Ser 0.94; Potassium 4.3; Sodium 143  Recent Lipid Panel    Component Value Date/Time   CHOL 116 04/24/2021 0852   TRIG 163 (H) 04/24/2021 0852   HDL 32 (L) 04/24/2021 0852   CHOLHDL 3.6 04/24/2021 0852   LDLCALC 56 04/24/2021 0852    Physical Exam:    VS:  BP 132/70   Pulse 60   Ht 5\' 3"  (1.6 m)   Wt 183 lb 3.2 oz (83.1 kg)   LMP 09/12/2002   SpO2 93%   BMI 32.45 kg/m     Wt Readings from Last 3 Encounters:  02/07/24 183 lb 3.2 oz (83.1 kg)  05/31/23 182 lb 6.4 oz (82.7 kg)  03/08/23 180 lb (81.6 kg)     GEN: Patient is in no acute distress HEENT: Normal NECK: No JVD; No carotid bruits LYMPHATICS: No lymphadenopathy CARDIAC: Hear sounds regular, 2/6 systolic murmur at the apex. RESPIRATORY:  Clear to auscultation without rales, wheezing or rhonchi  ABDOMEN: Soft, non-tender,  non-distended MUSCULOSKELETAL:  No edema; No deformity  SKIN: Warm and dry NEUROLOGIC:  Alert and oriented x 3 PSYCHIATRIC:  Normal affect   Signed, Ashley Brothers, MD  02/07/2024 2:46 PM    [facility]

## 2024-02-08 DIAGNOSIS — I2510 Atherosclerotic heart disease of native coronary artery without angina pectoris: Secondary | ICD-10-CM | POA: Diagnosis not present

## 2024-02-08 DIAGNOSIS — E782 Mixed hyperlipidemia: Secondary | ICD-10-CM | POA: Diagnosis not present

## 2024-02-08 DIAGNOSIS — Z131 Encounter for screening for diabetes mellitus: Secondary | ICD-10-CM | POA: Diagnosis not present

## 2024-02-08 DIAGNOSIS — I10 Essential (primary) hypertension: Secondary | ICD-10-CM | POA: Diagnosis not present

## 2024-02-09 ENCOUNTER — Other Ambulatory Visit: Payer: Self-pay

## 2024-02-09 DIAGNOSIS — I480 Paroxysmal atrial fibrillation: Secondary | ICD-10-CM

## 2024-02-09 LAB — CBC
Hematocrit: 39.8 % (ref 34.0–46.6)
Hemoglobin: 12.7 g/dL (ref 11.1–15.9)
MCH: 27 pg (ref 26.6–33.0)
MCHC: 31.9 g/dL (ref 31.5–35.7)
MCV: 85 fL (ref 79–97)
Platelets: 174 10*3/uL (ref 150–450)
RBC: 4.7 x10E6/uL (ref 3.77–5.28)
RDW: 13.6 % (ref 11.7–15.4)
WBC: 4.4 10*3/uL (ref 3.4–10.8)

## 2024-02-09 LAB — COMPREHENSIVE METABOLIC PANEL
ALT: 17 [IU]/L (ref 0–32)
AST: 21 [IU]/L (ref 0–40)
Albumin: 4.1 g/dL (ref 3.8–4.8)
Alkaline Phosphatase: 75 [IU]/L (ref 44–121)
BUN/Creatinine Ratio: 14 (ref 12–28)
BUN: 12 mg/dL (ref 8–27)
Bilirubin Total: 0.4 mg/dL (ref 0.0–1.2)
CO2: 26 mmol/L (ref 20–29)
Calcium: 9.6 mg/dL (ref 8.7–10.3)
Chloride: 103 mmol/L (ref 96–106)
Creatinine, Ser: 0.86 mg/dL (ref 0.57–1.00)
Globulin, Total: 2.2 g/dL (ref 1.5–4.5)
Glucose: 125 mg/dL — ABNORMAL HIGH (ref 70–99)
Potassium: 4.5 mmol/L (ref 3.5–5.2)
Sodium: 142 mmol/L (ref 134–144)
Total Protein: 6.3 g/dL (ref 6.0–8.5)
eGFR: 72 mL/min/{1.73_m2} (ref >59)

## 2024-02-09 LAB — LIPID PANEL
Chol/HDL Ratio: 3.7 {ratio} (ref 0.0–4.4)
Cholesterol, Total: 125 mg/dL (ref 100–199)
HDL: 34 mg/dL — ABNORMAL LOW (ref >39)
LDL Chol Calc (NIH): 66 mg/dL (ref 0–99)
Triglycerides: 144 mg/dL (ref 0–149)
VLDL Cholesterol Cal: 25 mg/dL (ref 5–40)

## 2024-02-09 LAB — HEMOGLOBIN A1C
Est. average glucose Bld gHb Est-mCnc: 143 mg/dL
Hgb A1c MFr Bld: 6.6 % — ABNORMAL HIGH (ref 4.8–5.6)

## 2024-02-09 LAB — TSH: TSH: 5.63 u[IU]/mL — ABNORMAL HIGH (ref 0.450–4.500)

## 2024-02-09 MED ORDER — FLECAINIDE ACETATE 50 MG PO TABS: 50.0000 mg | ORAL_TABLET | Freq: Two times a day (BID) | ORAL | 3 refills | Status: DC

## 2024-02-09 MED ORDER — ATORVASTATIN CALCIUM 10 MG PO TABS: 10.0000 mg | ORAL_TABLET | Freq: Every day | ORAL | 3 refills | Status: DC

## 2024-02-10 MED ORDER — APIXABAN 5 MG PO TABS: 5.0000 mg | ORAL_TABLET | Freq: Two times a day (BID) | ORAL | 1 refills | Status: DC

## 2024-02-10 NOTE — Telephone Encounter (Signed)
 Prescription refill request for Eliquis received. Indication:afib Last office visit:2/25 Scr:0.86  2/25 Age: 73 Weight:83.1  kg  Prescription refilled

## 2024-02-16 DIAGNOSIS — E039 Hypothyroidism, unspecified: Secondary | ICD-10-CM | POA: Diagnosis not present

## 2024-02-16 DIAGNOSIS — E119 Type 2 diabetes mellitus without complications: Secondary | ICD-10-CM | POA: Diagnosis not present

## 2024-02-16 DIAGNOSIS — Z6831 Body mass index (BMI) 31.0-31.9, adult: Secondary | ICD-10-CM | POA: Diagnosis not present

## 2024-02-22 DIAGNOSIS — J0100 Acute maxillary sinusitis, unspecified: Secondary | ICD-10-CM | POA: Diagnosis not present

## 2024-02-22 DIAGNOSIS — J209 Acute bronchitis, unspecified: Secondary | ICD-10-CM | POA: Diagnosis not present

## 2024-03-20 DIAGNOSIS — R059 Cough, unspecified: Secondary | ICD-10-CM | POA: Diagnosis not present

## 2024-03-20 DIAGNOSIS — R070 Pain in throat: Secondary | ICD-10-CM | POA: Diagnosis not present

## 2024-03-20 DIAGNOSIS — R0981 Nasal congestion: Secondary | ICD-10-CM | POA: Diagnosis not present

## 2024-03-20 DIAGNOSIS — H9203 Otalgia, bilateral: Secondary | ICD-10-CM | POA: Diagnosis not present

## 2024-03-22 ENCOUNTER — Encounter: Payer: Self-pay | Admitting: Obstetrics and Gynecology

## 2024-03-22 ENCOUNTER — Ambulatory Visit: Admitting: Obstetrics and Gynecology

## 2024-03-22 VITALS — BP 122/82 | HR 65 | Ht 62.0 in | Wt 180.0 lb

## 2024-03-22 DIAGNOSIS — Z9289 Personal history of other medical treatment: Secondary | ICD-10-CM

## 2024-03-22 DIAGNOSIS — N952 Postmenopausal atrophic vaginitis: Secondary | ICD-10-CM

## 2024-03-22 DIAGNOSIS — Z9189 Other specified personal risk factors, not elsewhere classified: Secondary | ICD-10-CM

## 2024-03-22 DIAGNOSIS — Z1231 Encounter for screening mammogram for malignant neoplasm of breast: Secondary | ICD-10-CM

## 2024-03-22 DIAGNOSIS — Z8742 Personal history of other diseases of the female genital tract: Secondary | ICD-10-CM | POA: Diagnosis not present

## 2024-03-22 DIAGNOSIS — Z01419 Encounter for gynecological examination (general) (routine) without abnormal findings: Secondary | ICD-10-CM

## 2024-03-22 MED ORDER — INTRAROSA 6.5 MG VA INST
1.0000 | VAGINAL_INSERT | Freq: Every evening | VAGINAL | 12 refills | Status: AC | PRN
Start: 2024-03-22 — End: ?

## 2024-03-22 NOTE — Progress Notes (Signed)
 73 y.o. y.o. female here for annual exam. Patient's last menstrual period was 09/12/2002.      She has been using her vaginal dilators and has been able to have intercourse. Using lubrication. Tried several different estrogen cream, vaginal pills and still having decreased libido and dyspareunia.  To try intrarosa.    [place] have horses, donkeys, goats, and a puppy.     Recent bought of diverticulitis.  Patient's last menstrual period was 09/12/2002.          Sexually active: Yes.    The current method of family planning is post menopausal status.    Exercising: Yes.    Gym/ health club routine includes light weights, stationary bike, and walking on track . Smoker:  no  Health Maintenance: Pap:  10/29/16 (last pap at 65)  History of abnormal Pap:  no MMG:  10/14/23 BMD:   10/06/21 osteopenia, T score -1.7 2024 -1.6 with 5.9/0,1% FRAX, repeat in 2026 Colonoscopy:2017 f/u 61yrs TDaP:  2019  Gardasil: NA Body mass index is 32.92 kg/m.      No data to display          Height 5\' 2"  (1.575 m), weight 180 lb (81.6 kg), last menstrual period 09/12/2002.  No results found for: "DIAGPAP", "HPVHIGH", "ADEQPAP"  GYN HISTORY: No results found for: "DIAGPAP", "HPVHIGH", "ADEQPAP"  OB History  Gravida Para Term Preterm AB Living  1 0 0 0 1 0  SAB IAB Ectopic Multiple Live Births          # Outcome Date GA Lbr Len/2nd Weight Sex Type Anes PTL Lv  1 AB             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   Complication of anesthesia    Coronary artery disease    Cystic breast    Essential hypertension 08/01/2017   Heart murmur    " nothing to be concerned about"   Hyperlipidemia    Hypertension    Mixed dyslipidemia 04/09/2020   Norovirus    Obesity (BMI 30.0-34.9) 05/31/2023   Osteopenia 2024   hip   PAF  (paroxysmal atrial fibrillation) (HCC) 02/14/2018   Plantar fasciitis 2006   right   PONV (postoperative nausea and vomiting)    Primary osteoarthritis of first carpometacarpal joint of right hand 11/28/2015   Primary osteoarthritis of left hip 03/20/2016   Puncture wound    right leg   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   Supraventricular tachycardia (HCC) 08/01/2017   Tachycardia    Trigger finger of right thumb 11/28/2015    Past Surgical History:  Procedure Laterality Date   COLONOSCOPY     HYSTEROSCOPY     D&C polypectomy   LEFT HEART CATH AND CORONARY ANGIOGRAPHY N/A 08/23/2017   Procedure: LEFT HEART CATH AND CORONARY ANGIOGRAPHY;  Surgeon: Lyn Records, MD;  Location: [facility];  Service: Cardiovascular;  Laterality: N/A;   SPINAL FUSION  2008   c5-7   TOTAL HIP ARTHROPLASTY Left 03/22/2016   Procedure: TOTAL HIP ARTHROPLASTY ANTERIOR APPROACH;  Surgeon: Gean Birchwood, MD;  Location: [facility];  Service: Orthopedics;  Laterality: Left;    Current Outpatient Medications on File Prior to Visit  Medication Sig Dispense Refill   acetaminophen (TYLENOL) 650 MG CR tablet Take 650 mg by mouth at  bedtime as needed for pain.     apixaban (ELIQUIS) 5 MG TABS tablet Take 1 tablet (5 mg total) by mouth 2 (two) times daily. 180 tablet 1   atorvastatin (LIPITOR) 10 MG tablet Take 1 tablet (10 mg total) by mouth at bedtime. 90 tablet 3   Cranberry 600 MG TABS Take 600 mg by mouth daily.     cyclobenzaprine (FLEXERIL) 5 MG tablet Take 5 mg by mouth as needed for muscle spasms.     estradiol (ESTRACE) 0.1 MG/GM vaginal cream 1 gram vaginally twice weekly 42.5 g 2   flecainide (TAMBOCOR) 50 MG tablet Take 1 tablet (50 mg total) by mouth 2 (two) times daily. 180 tablet 3   hydrOXYzine (ATARAX) 25 MG tablet Take 50 mg by mouth at bedtime as needed (sleep).     metoprolol succinate (TOPROL-XL) 50 MG 24 hr tablet Take 1 tablet by mouth daily.     Multiple Minerals-Vitamins (CITRACAL  MAXIMUM PLUS) TABS Take 1 tablet by mouth 2 (two) times daily.     promethazine-dextromethorphan (PROMETHAZINE-DM) 6.25-15 MG/5ML syrup Take 5 mLs by mouth 4 (four) times daily as needed.     telmisartan-hydrochlorothiazide (MICARDIS HCT) 40-12.5 MG tablet Take 1 tablet by mouth every morning.   3   Glucosamine-Chondroit-Vit C-Mn (GLUCOSAMINE CHONDR 1500 COMPLX PO) Take 3 tablets by mouth daily. (Patient not taking: Reported on 03/22/2024)     No current facility-administered medications on file prior to visit.    Social History   Socioeconomic History   Marital status: Married    Spouse name: Not on file   Number of children: Not on file   Years of education: Not on file   Highest education level: Not on file  Occupational History   Not on file  Tobacco Use   Smoking status: Never   Smokeless tobacco: Never  Vaping Use   Vaping status: Never Used  Substance and Sexual Activity   Alcohol use: No    Alcohol/week: 0.0 standard drinks of alcohol   Drug use: No   Sexual activity: Not Currently    Partners: Male    Birth control/protection: Post-menopausal  Other Topics Concern   Not on file  Social History Narrative   Not on file   Social Drivers of Health   Financial Resource Strain: Not on file  Food Insecurity: Not on file  Transportation Needs: Not on file  Physical Activity: Not on file  Stress: Not on file  Social Connections: Not on file  Intimate Partner Violence: Not on file    Family History  Problem Relation Age of Onset   Stroke Mother    Heart attack Father    Cancer Maternal Grandmother        uterine?   Diabetes Paternal Grandmother      Allergies  Allergen Reactions   Nsaids Diarrhea    ANTI-INFLAMMATORIES      Patient's last menstrual period was Patient's last menstrual period was 09/12/2002.Marland Kitchen             Review of Systems Alls systems reviewed and are negative.     Physical Exam Constitutional:      Appearance: Normal appearance.   Genitourinary:     Vulva and urethral meatus normal.     No lesions in the vagina.     Right Labia: No rash, lesions or skin changes.    Left Labia: No lesions, skin changes or rash.    No vaginal discharge or tenderness.  No vaginal prolapse present.    Moderate vaginal atrophy present.     Right Adnexa: not tender, not palpable and no mass present.    Left Adnexa: not tender, not palpable and no mass present.    No cervical motion tenderness or discharge.     Uterus is not enlarged, tender or irregular.  Breasts:    Right: Normal.     Left: Normal.  HENT:     Head: Normocephalic.  Neck:     Thyroid: No thyroid mass, thyromegaly or thyroid tenderness.  Cardiovascular:     Rate and Rhythm: Normal rate and regular rhythm.     Heart sounds: Normal heart sounds, S1 normal and S2 normal.  Pulmonary:     Effort: Pulmonary effort is normal.     Breath sounds: Normal breath sounds and air entry.  Abdominal:     General: There is no distension.     Palpations: Abdomen is soft. There is no mass.     Tenderness: There is no abdominal tenderness. There is no guarding or rebound.  Musculoskeletal:        General: Normal range of motion.     Cervical back: Full passive range of motion without pain, normal range of motion and neck supple. No tenderness.     Right lower leg: No edema.     Left lower leg: No edema.  Neurological:     Mental Status: She is alert.  Skin:    General: Skin is warm.  Psychiatric:        Mood and Affect: Mood normal.        Behavior: Behavior normal.        Thought Content: Thought content normal.  Vitals and nursing note reviewed. Exam conducted with a chaperone present.       A:         Well Woman GYN exam, dyspareunia, decreased libido                             P:        Pap smear not indicated Encouraged annual mammogram screening Colon cancer screening up-to-date DXA up-to-date Labs and immunizations to do with PMD Encouraged healthy  lifestyle practices Encouraged Vit D and Calcium  Intrarosa sent in. Counseled on the medication. Tried several other products with no success.  Discussed may not be covered with insurance.  No follow-ups on file.  Earley Favor

## 2024-03-27 ENCOUNTER — Telehealth: Payer: Self-pay

## 2024-03-27 NOTE — Telephone Encounter (Signed)
 Prior authorization done for Intrarosa 6.5mg  insert. Patient aware we are waiting on reply. Could take several days for a reply.  Key: [id number]

## 2024-03-29 NOTE — Telephone Encounter (Signed)
 Patient aware that the PA was approved.

## 2024-05-23 DIAGNOSIS — M51362 Other intervertebral disc degeneration, lumbar region with discogenic back pain and lower extremity pain: Secondary | ICD-10-CM | POA: Diagnosis not present

## 2024-05-23 DIAGNOSIS — M9902 Segmental and somatic dysfunction of thoracic region: Secondary | ICD-10-CM | POA: Diagnosis not present

## 2024-05-23 DIAGNOSIS — M9903 Segmental and somatic dysfunction of lumbar region: Secondary | ICD-10-CM | POA: Diagnosis not present

## 2024-05-23 DIAGNOSIS — M546 Pain in thoracic spine: Secondary | ICD-10-CM | POA: Diagnosis not present

## 2024-05-23 DIAGNOSIS — M5442 Lumbago with sciatica, left side: Secondary | ICD-10-CM | POA: Diagnosis not present

## 2024-05-31 DIAGNOSIS — M546 Pain in thoracic spine: Secondary | ICD-10-CM | POA: Diagnosis not present

## 2024-05-31 DIAGNOSIS — M5442 Lumbago with sciatica, left side: Secondary | ICD-10-CM | POA: Diagnosis not present

## 2024-05-31 DIAGNOSIS — M9903 Segmental and somatic dysfunction of lumbar region: Secondary | ICD-10-CM | POA: Diagnosis not present

## 2024-05-31 DIAGNOSIS — M9902 Segmental and somatic dysfunction of thoracic region: Secondary | ICD-10-CM | POA: Diagnosis not present

## 2024-05-31 DIAGNOSIS — M51362 Other intervertebral disc degeneration, lumbar region with discogenic back pain and lower extremity pain: Secondary | ICD-10-CM | POA: Diagnosis not present

## 2024-06-11 DIAGNOSIS — Z6832 Body mass index (BMI) 32.0-32.9, adult: Secondary | ICD-10-CM | POA: Diagnosis not present

## 2024-06-11 DIAGNOSIS — Z0000 Encounter for general adult medical examination without abnormal findings: Secondary | ICD-10-CM | POA: Diagnosis not present

## 2024-06-11 DIAGNOSIS — Z79899 Other long term (current) drug therapy: Secondary | ICD-10-CM | POA: Diagnosis not present

## 2024-06-11 DIAGNOSIS — E119 Type 2 diabetes mellitus without complications: Secondary | ICD-10-CM | POA: Diagnosis not present

## 2024-06-11 DIAGNOSIS — Z1339 Encounter for screening examination for other mental health and behavioral disorders: Secondary | ICD-10-CM | POA: Diagnosis not present

## 2024-06-11 DIAGNOSIS — E039 Hypothyroidism, unspecified: Secondary | ICD-10-CM | POA: Diagnosis not present

## 2024-06-11 LAB — LAB REPORT - SCANNED
A1c: 6.6
EGFR: 60

## 2024-06-20 DIAGNOSIS — M9903 Segmental and somatic dysfunction of lumbar region: Secondary | ICD-10-CM | POA: Diagnosis not present

## 2024-06-20 DIAGNOSIS — M51362 Other intervertebral disc degeneration, lumbar region with discogenic back pain and lower extremity pain: Secondary | ICD-10-CM | POA: Diagnosis not present

## 2024-06-20 DIAGNOSIS — M5442 Lumbago with sciatica, left side: Secondary | ICD-10-CM | POA: Diagnosis not present

## 2024-06-20 DIAGNOSIS — M9902 Segmental and somatic dysfunction of thoracic region: Secondary | ICD-10-CM | POA: Diagnosis not present

## 2024-06-20 DIAGNOSIS — M546 Pain in thoracic spine: Secondary | ICD-10-CM | POA: Diagnosis not present

## 2024-06-28 DIAGNOSIS — M51362 Other intervertebral disc degeneration, lumbar region with discogenic back pain and lower extremity pain: Secondary | ICD-10-CM | POA: Diagnosis not present

## 2024-06-28 DIAGNOSIS — M546 Pain in thoracic spine: Secondary | ICD-10-CM | POA: Diagnosis not present

## 2024-06-28 DIAGNOSIS — M9902 Segmental and somatic dysfunction of thoracic region: Secondary | ICD-10-CM | POA: Diagnosis not present

## 2024-06-28 DIAGNOSIS — M5442 Lumbago with sciatica, left side: Secondary | ICD-10-CM | POA: Diagnosis not present

## 2024-06-28 DIAGNOSIS — M9903 Segmental and somatic dysfunction of lumbar region: Secondary | ICD-10-CM | POA: Diagnosis not present

## 2024-07-18 DIAGNOSIS — M9902 Segmental and somatic dysfunction of thoracic region: Secondary | ICD-10-CM | POA: Diagnosis not present

## 2024-07-18 DIAGNOSIS — M5442 Lumbago with sciatica, left side: Secondary | ICD-10-CM | POA: Diagnosis not present

## 2024-07-18 DIAGNOSIS — M9903 Segmental and somatic dysfunction of lumbar region: Secondary | ICD-10-CM | POA: Diagnosis not present

## 2024-07-18 DIAGNOSIS — M546 Pain in thoracic spine: Secondary | ICD-10-CM | POA: Diagnosis not present

## 2024-07-18 DIAGNOSIS — M51362 Other intervertebral disc degeneration, lumbar region with discogenic back pain and lower extremity pain: Secondary | ICD-10-CM | POA: Diagnosis not present

## 2024-07-23 DIAGNOSIS — R300 Dysuria: Secondary | ICD-10-CM | POA: Diagnosis not present

## 2024-07-23 DIAGNOSIS — N3090 Cystitis, unspecified without hematuria: Secondary | ICD-10-CM | POA: Diagnosis not present

## 2024-07-30 ENCOUNTER — Other Ambulatory Visit: Payer: Self-pay

## 2024-07-31 ENCOUNTER — Encounter: Payer: Self-pay | Admitting: Cardiology

## 2024-07-31 ENCOUNTER — Ambulatory Visit: Attending: Cardiology | Admitting: Cardiology

## 2024-07-31 VITALS — BP 128/68 | HR 65 | Ht 63.0 in | Wt 188.4 lb

## 2024-07-31 DIAGNOSIS — E782 Mixed hyperlipidemia: Secondary | ICD-10-CM | POA: Diagnosis not present

## 2024-07-31 DIAGNOSIS — I2510 Atherosclerotic heart disease of native coronary artery without angina pectoris: Secondary | ICD-10-CM | POA: Diagnosis not present

## 2024-07-31 DIAGNOSIS — I480 Paroxysmal atrial fibrillation: Secondary | ICD-10-CM | POA: Diagnosis not present

## 2024-07-31 DIAGNOSIS — I10 Essential (primary) hypertension: Secondary | ICD-10-CM | POA: Insufficient documentation

## 2024-07-31 DIAGNOSIS — E66811 Obesity, class 1: Secondary | ICD-10-CM | POA: Insufficient documentation

## 2024-07-31 NOTE — Progress Notes (Signed)
 Cardiology Office Note:    Date:  07/31/2024   ID:  Ashley Mcdonald, DOB 1951/11/22, MRN [id number]  PCP:  Ina Marcellus RAMAN, MD  Cardiologist:  Jennifer JONELLE Crape, MD   Referring MD: Ina Marcellus RAMAN, MD    ASSESSMENT:    1. Coronary artery disease involving native coronary artery of native heart without angina pectoris   2. Essential hypertension   3. PAF (paroxysmal atrial fibrillation) (HCC)   4. Mixed dyslipidemia   5. Obesity (BMI 30.0-34.9)    PLAN:    In order of problems listed above:  Coronary artery disease: Secondary prevention stressed with the patient.  Importance of compliance with diet medication stressed and patient verbalized standing.  She was advised to walk at least half another day on a daily basis. Paroxysmal atrial fibrillation:I discussed with the patient atrial fibrillation, disease process. Management and therapy including rate and rhythm control, anticoagulation benefits and potential risks were discussed extensively with the patient. Patient had multiple questions which were answered to patient's satisfaction. Mixed dyslipidemia: On lipid-lowering medications followed by primary care.  Lipids from primary care obtained and reviewed and they are fine.  Lipids are at goal. Obesity: Weight reduction stressed diet emphasized and she promises to do better. Essential hypertension: Lifestyle modification urged blood pressure is stable. Patient will be seen in follow-up appointment in 6 months or earlier if the patient has any concerns.    Medication Adjustments/Labs and Tests Ordered: Current medicines are reviewed at length with the patient today.  Concerns regarding medicines are outlined above.  Orders Placed This Encounter  Procedures   EKG 12-Lead   No orders of the defined types were placed in this encounter.    No chief complaint on file.    History of Present Illness:    Ashley Mcdonald is a 73 y.o. female.  Patient has a past medical history of  coronary artery disease, essential hypertension, mixed dyslipidemia and paroxysmal atrial fibrillation.  She denies any problems at this time and takes care of activities of daily living.  No chest pain orthopnea or PND.  She leads a sedentary lifestyle and does not exercise on a regular basis.  She tells me that she is not compliant with food and food habits are not the best.  At the time of my evaluation, the patient is alert awake oriented and in no distress.  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   Complication of anesthesia    Coronary artery disease    Cystic breast    Essential hypertension 08/01/2017   Heart murmur     nothing to be concerned about   Hyperlipidemia    Hypertension    Mixed dyslipidemia 04/09/2020   Norovirus    Obesity (BMI 30.0-34.9) 05/31/2023   Osteopenia 2024   hip   PAF (paroxysmal atrial fibrillation) (HCC) 02/14/2018   Plantar fasciitis 2006   right   PONV (postoperative nausea and vomiting)    Primary osteoarthritis of first carpometacarpal joint of right hand 11/28/2015   Primary osteoarthritis of left hip 03/20/2016   Puncture wound    right leg   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   Supraventricular tachycardia (HCC) 08/01/2017   Tachycardia    Trigger finger of right thumb 11/28/2015    Past Surgical History:  Procedure Laterality Date   COLONOSCOPY  HYSTEROSCOPY     D&C polypectomy   LEFT HEART CATH AND CORONARY ANGIOGRAPHY N/A 08/23/2017   Procedure: LEFT HEART CATH AND CORONARY ANGIOGRAPHY;  Surgeon: Claudene Victory ORN, MD;  Location: [facility];  Service: Cardiovascular;  Laterality: N/A;   SPINAL FUSION  2008   c5-7   TOTAL HIP ARTHROPLASTY Left 03/22/2016   Procedure: TOTAL HIP ARTHROPLASTY ANTERIOR APPROACH;  Surgeon: Dempsey Sensor, MD;  Location: [facility];  Service: Orthopedics;   Laterality: Left;    Current Medications: Current Meds  Medication Sig   acetaminophen  (TYLENOL ) 650 MG CR tablet Take 650 mg by mouth at bedtime as needed for pain.   apixaban  (ELIQUIS ) 5 MG TABS tablet Take 1 tablet (5 mg total) by mouth 2 (two) times daily.   atorvastatin  (LIPITOR) 10 MG tablet Take 1 tablet (10 mg total) by mouth at bedtime.   Cranberry 600 MG TABS Take 600 mg by mouth daily.   cyclobenzaprine (FLEXERIL) 5 MG tablet Take 5 mg by mouth as needed for muscle spasms.   estradiol  (ESTRACE ) 0.1 MG/GM vaginal cream 1 gram vaginally twice weekly   flecainide  (TAMBOCOR ) 50 MG tablet Take 1 tablet (50 mg total) by mouth 2 (two) times daily.   hydrOXYzine (ATARAX) 25 MG tablet Take 50 mg by mouth at bedtime as needed (sleep).   levothyroxine (SYNTHROID) 25 MCG tablet Take 25 mcg by mouth daily.   metFORMIN (GLUCOPHAGE-XR) 500 MG 24 hr tablet Take 500 mg by mouth daily.   metoprolol  succinate (TOPROL -XL) 50 MG 24 hr tablet Take 1 tablet by mouth daily.   Multiple Minerals-Vitamins (CITRACAL MAXIMUM PLUS) TABS Take 1 tablet by mouth 2 (two) times daily.   Prasterone  (INTRAROSA ) 6.5 MG INST Place 1 suppository vaginally at bedtime as needed.   promethazine-dextromethorphan (PROMETHAZINE-DM) 6.25-15 MG/5ML syrup Take 5 mLs by mouth 4 (four) times daily as needed.   telmisartan -hydrochlorothiazide  (MICARDIS  HCT) 40-12.5 MG tablet Take 1 tablet by mouth every morning.      Allergies:   Nsaids   Social History   Socioeconomic History   Marital status: Married    Spouse name: Not on file   Number of children: Not on file   Years of education: Not on file   Highest education level: Not on file  Occupational History   Not on file  Tobacco Use   Smoking status: Never   Smokeless tobacco: Never  Vaping Use   Vaping status: Never Used  Substance and Sexual Activity   Alcohol use: No    Alcohol/week: 0.0 standard drinks of alcohol   Drug use: No   Sexual activity: Not  Currently    Partners: Male    Birth control/protection: Post-menopausal    Comment: more than 5, history of std (syphilis & chlamydia), after 16, no history of abnormal pap, no DES  Other Topics Concern   Not on file  Social History Narrative   Not on file   Social Drivers of Health   Financial Resource Strain: Not on file  Food Insecurity: Not on file  Transportation Needs: Not on file  Physical Activity: Not on file  Stress: Not on file  Social Connections: Not on file     Family History: The patient's family history includes Cancer in her maternal grandmother; Diabetes in her paternal grandmother; Heart attack in her father; Stroke in her mother.  ROS:   Please see the history of present illness.    All other systems reviewed and are negative.  EKGs/Labs/Other Studies  Reviewed:    The following studies were reviewed today: .SABRAEKG Interpretation Date/Time:  Tuesday July 31 2024 13:06:50 EDT Ventricular Rate:  65 PR Interval:  148 QRS Duration:  76 QT Interval:  420 QTC Calculation: 436 R Axis:   39  Text Interpretation: Normal sinus rhythm Low voltage QRS Cannot rule out Anterior infarct (cited on or before 07-Feb-2024) Abnormal ECG When compared with ECG of 07-Feb-2024 14:15, No significant change was found Confirmed by Edwyna Backers [phone]) on 07/31/2024 1:29:33 PM     Recent Labs: 02/08/2024: ALT 17; BUN 12; Creatinine, Ser 0.86; Hemoglobin 12.7; Platelets 174; Potassium 4.5; Sodium 142; TSH 5.630  Recent Lipid Panel    Component Value Date/Time   CHOL 125 02/08/2024 0915   TRIG 144 02/08/2024 0915   HDL 34 (L) 02/08/2024 0915   CHOLHDL 3.7 02/08/2024 0915   LDLCALC 66 02/08/2024 0915    Physical Exam:    VS:  BP 128/68   Pulse 65   Ht 5' 3 (1.6 m)   Wt 188 lb 6.4 oz (85.5 kg)   LMP 09/12/2002   SpO2 93%   BMI 33.37 kg/m     Wt Readings from Last 3 Encounters:  07/31/24 188 lb 6.4 oz (85.5 kg)  03/22/24 180 lb (81.6 kg)  02/07/24 183 lb 3.2  oz (83.1 kg)     GEN: Patient is in no acute distress HEENT: Normal NECK: No JVD; No carotid bruits LYMPHATICS: No lymphadenopathy CARDIAC: Hear sounds regular, 2/6 systolic murmur at the apex. RESPIRATORY:  Clear to auscultation without rales, wheezing or rhonchi  ABDOMEN: Soft, non-tender, non-distended MUSCULOSKELETAL:  No edema; No deformity  SKIN: Warm and dry NEUROLOGIC:  Alert and oriented x 3 PSYCHIATRIC:  Normal affect   Signed, Backers JONELLE Edwyna, MD  07/31/2024 1:44 PM    [facility]

## 2024-07-31 NOTE — Patient Instructions (Signed)

## 2024-08-09 ENCOUNTER — Other Ambulatory Visit: Payer: Self-pay | Admitting: Cardiology

## 2024-08-09 DIAGNOSIS — I480 Paroxysmal atrial fibrillation: Secondary | ICD-10-CM

## 2024-08-10 NOTE — Telephone Encounter (Signed)
 Eliquis  5mg  refill request received. Patient is 73 years old, weight-85.5kg, Crea-0.86 on 02/08/24, Diagnosis-Afib, and last seen by Dr. Edwyna on 07/31/24 . Dose is appropriate based on dosing criteria. Will send in refill to requested pharmacy.

## 2024-08-12 DIAGNOSIS — R350 Frequency of micturition: Secondary | ICD-10-CM | POA: Diagnosis not present

## 2024-08-12 DIAGNOSIS — R300 Dysuria: Secondary | ICD-10-CM | POA: Diagnosis not present

## 2024-08-12 DIAGNOSIS — R1084 Generalized abdominal pain: Secondary | ICD-10-CM | POA: Diagnosis not present

## 2024-08-12 DIAGNOSIS — N3000 Acute cystitis without hematuria: Secondary | ICD-10-CM | POA: Diagnosis not present

## 2024-08-15 ENCOUNTER — Other Ambulatory Visit: Payer: Self-pay

## 2024-08-15 DIAGNOSIS — M546 Pain in thoracic spine: Secondary | ICD-10-CM | POA: Diagnosis not present

## 2024-08-15 DIAGNOSIS — M5442 Lumbago with sciatica, left side: Secondary | ICD-10-CM | POA: Diagnosis not present

## 2024-08-15 DIAGNOSIS — N952 Postmenopausal atrophic vaginitis: Secondary | ICD-10-CM

## 2024-08-15 DIAGNOSIS — M9903 Segmental and somatic dysfunction of lumbar region: Secondary | ICD-10-CM | POA: Diagnosis not present

## 2024-08-15 DIAGNOSIS — M9902 Segmental and somatic dysfunction of thoracic region: Secondary | ICD-10-CM | POA: Diagnosis not present

## 2024-08-15 DIAGNOSIS — M51362 Other intervertebral disc degeneration, lumbar region with discogenic back pain and lower extremity pain: Secondary | ICD-10-CM | POA: Diagnosis not present

## 2024-08-15 NOTE — Telephone Encounter (Signed)
.  Med refill request: Estradiol  vaginal cream  Last AEX: 03/22/24 Next AEX: Not scheduled  Last MMG (if hormonal med) 10/14/23- BI-RADS Cat. 1 Neg.  Refill authorized: Please Advise?

## 2024-08-16 MED ORDER — ESTRADIOL 0.1 MG/GM VA CREA: TOPICAL_CREAM | VAGINAL | 2 refills | Status: AC

## 2024-08-17 DIAGNOSIS — L570 Actinic keratosis: Secondary | ICD-10-CM | POA: Diagnosis not present

## 2024-08-17 DIAGNOSIS — D225 Melanocytic nevi of trunk: Secondary | ICD-10-CM | POA: Diagnosis not present

## 2024-08-17 DIAGNOSIS — D2239 Melanocytic nevi of other parts of face: Secondary | ICD-10-CM | POA: Diagnosis not present

## 2024-08-17 DIAGNOSIS — L820 Inflamed seborrheic keratosis: Secondary | ICD-10-CM | POA: Diagnosis not present

## 2024-09-12 DIAGNOSIS — Z6833 Body mass index (BMI) 33.0-33.9, adult: Secondary | ICD-10-CM | POA: Diagnosis not present

## 2024-09-12 DIAGNOSIS — I10 Essential (primary) hypertension: Secondary | ICD-10-CM | POA: Diagnosis not present

## 2024-09-17 DIAGNOSIS — M51362 Other intervertebral disc degeneration, lumbar region with discogenic back pain and lower extremity pain: Secondary | ICD-10-CM | POA: Diagnosis not present

## 2024-09-17 DIAGNOSIS — M546 Pain in thoracic spine: Secondary | ICD-10-CM | POA: Diagnosis not present

## 2024-09-17 DIAGNOSIS — M9902 Segmental and somatic dysfunction of thoracic region: Secondary | ICD-10-CM | POA: Diagnosis not present

## 2024-09-17 DIAGNOSIS — M9903 Segmental and somatic dysfunction of lumbar region: Secondary | ICD-10-CM | POA: Diagnosis not present

## 2024-09-17 DIAGNOSIS — M5442 Lumbago with sciatica, left side: Secondary | ICD-10-CM | POA: Diagnosis not present

## 2024-09-27 DIAGNOSIS — M546 Pain in thoracic spine: Secondary | ICD-10-CM | POA: Diagnosis not present

## 2024-09-27 DIAGNOSIS — M9903 Segmental and somatic dysfunction of lumbar region: Secondary | ICD-10-CM | POA: Diagnosis not present

## 2024-09-27 DIAGNOSIS — M5442 Lumbago with sciatica, left side: Secondary | ICD-10-CM | POA: Diagnosis not present

## 2024-09-27 DIAGNOSIS — M9902 Segmental and somatic dysfunction of thoracic region: Secondary | ICD-10-CM | POA: Diagnosis not present

## 2024-09-27 DIAGNOSIS — M51362 Other intervertebral disc degeneration, lumbar region with discogenic back pain and lower extremity pain: Secondary | ICD-10-CM | POA: Diagnosis not present

## 2024-10-04 DIAGNOSIS — Z209 Contact with and (suspected) exposure to unspecified communicable disease: Secondary | ICD-10-CM | POA: Diagnosis not present

## 2024-10-04 DIAGNOSIS — B349 Viral infection, unspecified: Secondary | ICD-10-CM | POA: Diagnosis not present

## 2024-10-08 DIAGNOSIS — J329 Chronic sinusitis, unspecified: Secondary | ICD-10-CM | POA: Diagnosis not present

## 2024-10-08 DIAGNOSIS — J40 Bronchitis, not specified as acute or chronic: Secondary | ICD-10-CM | POA: Diagnosis not present

## 2024-10-17 DIAGNOSIS — Z6832 Body mass index (BMI) 32.0-32.9, adult: Secondary | ICD-10-CM | POA: Diagnosis not present

## 2024-10-17 DIAGNOSIS — J329 Chronic sinusitis, unspecified: Secondary | ICD-10-CM | POA: Diagnosis not present

## 2024-10-17 DIAGNOSIS — J40 Bronchitis, not specified as acute or chronic: Secondary | ICD-10-CM | POA: Diagnosis not present

## 2024-10-22 DIAGNOSIS — J189 Pneumonia, unspecified organism: Secondary | ICD-10-CM | POA: Diagnosis not present

## 2024-10-22 DIAGNOSIS — R059 Cough, unspecified: Secondary | ICD-10-CM | POA: Diagnosis not present

## 2024-10-22 DIAGNOSIS — R0981 Nasal congestion: Secondary | ICD-10-CM | POA: Diagnosis not present

## 2024-10-27 ENCOUNTER — Emergency Department (HOSPITAL_COMMUNITY)
Admission: EM | Admit: 2024-10-27 | Discharge: 2024-10-27 | Disposition: A | Discharge status: Home / Self Care | Attending: Emergency Medicine | Admitting: Emergency Medicine

## 2024-10-27 ENCOUNTER — Telehealth (HOSPITAL_COMMUNITY): Payer: Self-pay | Admitting: Emergency Medicine

## 2024-10-27 ENCOUNTER — Other Ambulatory Visit: Payer: Self-pay

## 2024-10-27 ENCOUNTER — Emergency Department (HOSPITAL_COMMUNITY)

## 2024-10-27 ENCOUNTER — Telehealth: Payer: Self-pay

## 2024-10-27 ENCOUNTER — Encounter (HOSPITAL_COMMUNITY): Payer: Self-pay

## 2024-10-27 DIAGNOSIS — W06XXXA Fall from bed, initial encounter: Secondary | ICD-10-CM | POA: Insufficient documentation

## 2024-10-27 DIAGNOSIS — I2510 Atherosclerotic heart disease of native coronary artery without angina pectoris: Secondary | ICD-10-CM | POA: Diagnosis not present

## 2024-10-27 DIAGNOSIS — R001 Bradycardia, unspecified: Secondary | ICD-10-CM | POA: Diagnosis not present

## 2024-10-27 DIAGNOSIS — R918 Other nonspecific abnormal finding of lung field: Secondary | ICD-10-CM | POA: Diagnosis not present

## 2024-10-27 DIAGNOSIS — D649 Anemia, unspecified: Secondary | ICD-10-CM | POA: Diagnosis not present

## 2024-10-27 DIAGNOSIS — S2232XA Fracture of one rib, left side, initial encounter for closed fracture: Secondary | ICD-10-CM | POA: Diagnosis not present

## 2024-10-27 DIAGNOSIS — Z7901 Long term (current) use of anticoagulants: Secondary | ICD-10-CM | POA: Insufficient documentation

## 2024-10-27 DIAGNOSIS — I700 Atherosclerosis of aorta: Secondary | ICD-10-CM | POA: Diagnosis not present

## 2024-10-27 DIAGNOSIS — I480 Paroxysmal atrial fibrillation: Secondary | ICD-10-CM | POA: Diagnosis not present

## 2024-10-27 DIAGNOSIS — S3991XA Unspecified injury of abdomen, initial encounter: Secondary | ICD-10-CM | POA: Diagnosis present

## 2024-10-27 LAB — BASIC METABOLIC PANEL WITH GFR
Anion gap: 11 (ref 5–15)
BUN: 16 mg/dL (ref 8–23)
CO2: 25 mmol/L (ref 22–32)
Calcium: 9 mg/dL (ref 8.9–10.3)
Chloride: 102 mmol/L (ref 98–111)
Creatinine, Ser: 0.84 mg/dL (ref 0.44–1.00)
GFR, Estimated: >60 mL/min (ref >60)
Glucose, Bld: 169 mg/dL — ABNORMAL HIGH (ref 70–99)
Potassium: 3.8 mmol/L (ref 3.5–5.1)
Sodium: 138 mmol/L (ref 135–145)

## 2024-10-27 LAB — TRANSFERRIN: Transferrin: 300 mg/dL (ref 192–382)

## 2024-10-27 LAB — HEPATIC FUNCTION PANEL
ALT: 30 U/L (ref 0–44)
AST: 27 U/L (ref 15–41)
Albumin: 3.3 g/dL — ABNORMAL LOW (ref 3.5–5.0)
Alkaline Phosphatase: 47 U/L (ref 38–126)
Bilirubin, Direct: <0.1 mg/dL (ref 0.0–0.2)
Total Bilirubin: <0.2 mg/dL (ref 0.0–1.2)
Total Protein: 6.3 g/dL — ABNORMAL LOW (ref 6.5–8.1)

## 2024-10-27 LAB — CBC
HCT: 30.5 % — ABNORMAL LOW (ref 36.0–46.0)
Hemoglobin: 9.4 g/dL — ABNORMAL LOW (ref 12.0–15.0)
MCH: 23.7 pg — ABNORMAL LOW (ref 26.0–34.0)
MCHC: 30.8 g/dL (ref 30.0–36.0)
MCV: 77 fL — ABNORMAL LOW (ref 80.0–100.0)
Platelets: 248 K/uL (ref 150–400)
RBC: 3.96 MIL/uL (ref 3.87–5.11)
RDW: 13.9 % (ref 11.5–15.5)
WBC: 6.3 K/uL (ref 4.0–10.5)
nRBC: 0 % (ref 0.0–0.2)

## 2024-10-27 LAB — IRON AND TIBC
Iron: 15 ug/dL — ABNORMAL LOW (ref 28–170)
Saturation Ratios: 4 % — ABNORMAL LOW (ref 10.4–31.8)
TIBC: 427 ug/dL (ref 250–450)
UIBC: 412 ug/dL

## 2024-10-27 LAB — PROTIME-INR
INR: 1.4 — ABNORMAL HIGH (ref 0.8–1.2)
Prothrombin Time: 17.8 s — ABNORMAL HIGH (ref 11.4–15.2)

## 2024-10-27 LAB — FERRITIN: Ferritin: 10 ng/mL — ABNORMAL LOW (ref 11–307)

## 2024-10-27 LAB — POC OCCULT BLOOD, ED: Fecal Occult Bld: NEGATIVE

## 2024-10-27 LAB — TROPONIN I (HIGH SENSITIVITY)
Troponin I (High Sensitivity): 5 ng/L (ref <18)
Troponin I (High Sensitivity): 5 ng/L (ref <18)

## 2024-10-27 MED ORDER — HYDROCODONE-ACETAMINOPHEN 5-325 MG PO TABS: 1.0000 | ORAL_TABLET | Freq: Four times a day (QID) | ORAL | 0 refills | Status: AC | PRN

## 2024-10-27 MED ORDER — ONDANSETRON HCL 4 MG/2ML IJ SOLN
4.0000 mg | Freq: Once | INTRAMUSCULAR | Status: AC
  Administered 2024-10-27: 4 mg via INTRAVENOUS
  Filled 2024-10-27: qty 2

## 2024-10-27 MED ORDER — MORPHINE SULFATE (PF) 4 MG/ML IV SOLN
4.0000 mg | Freq: Once | INTRAVENOUS | Status: AC
  Administered 2024-10-27: 4 mg via INTRAVENOUS
  Filled 2024-10-27: qty 1

## 2024-10-27 MED ORDER — IOHEXOL 350 MG/ML SOLN
75.0000 mL | Freq: Once | INTRAVENOUS | Status: AC | PRN
  Administered 2024-10-27: 75 mL via INTRAVENOUS

## 2024-10-27 MED ORDER — HYDROCODONE-ACETAMINOPHEN 5-325 MG PO TABS: 1.0000 | ORAL_TABLET | Freq: Four times a day (QID) | ORAL | 0 refills | Status: DC | PRN

## 2024-10-27 NOTE — ED Provider Notes (Signed)
 [facility] Provider Note   CSN: [id number] Arrival date & time: 10/27/24  9194     Patient presents with: Lt side pain, Shortness of Breath, and Fall  HPI Ashley Mcdonald is a 73 y.o. female with CAD, paroxysmal A-fib on Eliquis  presenting for left-sided flank pain and shortness of breath.  She states has been ongoing for about a week.  She reports that about 6 days ago she fell as she was getting up out of bed with her left arm outstretched and landing on her left side.  She is persistently since then had left-sided flank and left upper back and left chest pain.  She states that hurts with breathing and also with movement.  She also reports a lingering productive cough since her diagnosis of pneumonia in October.  She was initially started on Augmentin and now finishing up a course of doxycycline .  Denies fever.  She also reports she has had a bedsore in about her right buttock that has been there for 2 weeks.  Not painful but still there and her husband is concerned about it.    Shortness of Breath Fall Associated symptoms include shortness of breath.       Prior to Admission medications   Medication Sig Start Date End Date Taking? Authorizing Provider  HYDROcodone-acetaminophen  (NORCO/VICODIN) 5-325 MG tablet Take 1 tablet by mouth every 6 (six) hours as needed for up to 8 doses. 10/27/24  Yes Lang Norleen POUR, PA-C  acetaminophen  (TYLENOL ) 650 MG CR tablet Take 650 mg by mouth at bedtime as needed for pain.    [provider]  apixaban  (ELIQUIS ) 5 MG TABS tablet TAKE 1 TABLET BY MOUTH TWICE DAILY 08/10/24   Revankar, Jennifer JONELLE, MD  atorvastatin  (LIPITOR) 10 MG tablet Take 1 tablet (10 mg total) by mouth at bedtime. 02/09/24   Revankar, Jennifer JONELLE, MD  Cranberry 600 MG TABS Take 600 mg by mouth daily.    [provider]  cyclobenzaprine (FLEXERIL) 5 MG tablet Take 5 mg by mouth as needed for muscle spasms. 04/26/22   [provider]  estradiol  (ESTRACE ) 0.1 MG/GM vaginal cream 1 gram vaginally twice weekly 08/16/24   Glennon Almarie POUR, MD  flecainide  (TAMBOCOR ) 50 MG tablet Take 1 tablet (50 mg total) by mouth 2 (two) times daily. 02/09/24   Revankar, Rajan R, MD  hydrOXYzine (ATARAX) 25 MG tablet Take 50 mg by mouth at bedtime as needed (sleep).    [provider]  levothyroxine (SYNTHROID) 25 MCG tablet Take 25 mcg by mouth daily. 07/11/24   [provider]  metFORMIN (GLUCOPHAGE-XR) 500 MG 24 hr tablet Take 500 mg by mouth daily. 07/11/24   [provider]  metoprolol  succinate (TOPROL -XL) 50 MG 24 hr tablet Take 1 tablet by mouth daily. 08/17/19   [provider]  Multiple Minerals-Vitamins (CITRACAL MAXIMUM PLUS) TABS Take 1 tablet by mouth 2 (two) times daily. 09/14/23   [provider]  Prasterone  (INTRAROSA ) 6.5 MG INST Place 1 suppository vaginally at bedtime as needed. 03/22/24   Glennon Almarie POUR, MD  promethazine-dextromethorphan (PROMETHAZINE-DM) 6.25-15 MG/5ML syrup Take 5 mLs by mouth 4 (four) times daily as needed. 02/22/24   [provider]  telmisartan -hydrochlorothiazide  (MICARDIS  HCT) 40-12.5 MG tablet Take 1 tablet by mouth every morning.  10/11/15   [provider]    Allergies: Nsaids    Review of Systems  Respiratory:  Positive for shortness of breath.  Updated Vital Signs BP 130/60   Pulse (!) 51   Temp 97.7 F (36.5 C) (Oral)   Resp 15   Ht 5' 3 (1.6 m)   Wt 83.5 kg   LMP 09/12/2002   SpO2 92%   BMI 32.59 kg/m   Physical Exam Vitals and nursing note reviewed.  HENT:     Head: Normocephalic and atraumatic.     Mouth/Throat:     Mouth: Mucous membranes are moist.  Eyes:     General:        Right eye: No discharge.        Left eye: No discharge.     Conjunctiva/sclera: Conjunctivae normal.  Cardiovascular:     Rate and Rhythm: Normal rate and regular rhythm.     Pulses: Normal pulses.     Heart  sounds: Normal heart sounds.  Pulmonary:     Effort: Pulmonary effort is normal.     Breath sounds: Normal breath sounds.  Chest:     Comments: Tenderness with palpation to the left lateral chest wall but no ecchymosis, erythema or edema and no step-off. Abdominal:     General: Abdomen is flat.     Palpations: Abdomen is soft.  Skin:    General: Skin is warm and dry.     Comments: Pale  Neurological:     General: No focal deficit present.  Psychiatric:        Mood and Affect: Mood normal.     (all labs ordered are listed, but only abnormal results are displayed) Labs Reviewed  BASIC METABOLIC PANEL WITH GFR - Abnormal; Notable for the following components:      Result Value   Glucose, Bld 169 (*)    All other components within normal limits  CBC - Abnormal; Notable for the following components:   Hemoglobin 9.4 (*)    HCT 30.5 (*)    MCV 77.0 (*)    MCH 23.7 (*)    All other components within normal limits  FERRITIN - Abnormal; Notable for the following components:   Ferritin 10 (*)    All other components within normal limits  IRON AND TIBC - Abnormal; Notable for the following components:   Iron 15 (*)    Saturation Ratios 4 (*)    All other components within normal limits  HEPATIC FUNCTION PANEL - Abnormal; Notable for the following components:   Total Protein 6.3 (*)    Albumin 3.3 (*)    All other components within normal limits  PROTIME-INR - Abnormal; Notable for the following components:   Prothrombin Time 17.8 (*)    INR 1.4 (*)    All other components within normal limits  TRANSFERRIN  POC OCCULT BLOOD, ED  TROPONIN I (HIGH SENSITIVITY)  TROPONIN I (HIGH SENSITIVITY)    EKG: EKG Interpretation Date/Time:  Saturday October 27 2024 09:09:47 EST Ventricular Rate:  58 PR Interval:    QRS Duration:  84 QT Interval:  434 QTC Calculation: 426 R Axis:   38  Text Interpretation: Sinus bradycardia T wave abnormality, consider lateral ischemia Abnormal  ECG Confirmed by Yolande Charleston [phone]) on 10/27/2024 9:53:37 AM  Radiology: CT Angio Chest PE W/Cm &/Or Wo Cm Result Date: 10/27/2024 CLINICAL DATA:  Pulmonary embolism (PE) suspected, high prob; Abdominal trauma, blunt EXAM: CT ANGIOGRAPHY CHEST CT ABDOMEN AND PELVIS WITH CONTRAST TECHNIQUE: Multidetector CT imaging of the chest was performed using the standard protocol during bolus administration of intravenous contrast. Multiplanar CT image reconstructions and MIPs  were obtained to evaluate the vascular anatomy. Multidetector CT imaging of the abdomen and pelvis was performed using the standard protocol during bolus administration of intravenous contrast. RADIATION DOSE REDUCTION: This exam was performed according to the departmental dose-optimization program which includes automated exposure control, adjustment of the mA and/or kV according to patient size and/or use of iterative reconstruction technique. CONTRAST:  75mL OMNIPAQUE  IOHEXOL  350 MG/ML SOLN COMPARISON:  Cardiac CT 06/10/2023, abdominal CT 05/13/2020 and chest CTA 02/10/2018. FINDINGS: CTA CHEST FINDINGS Cardiovascular: The pulmonary arteries are well opacified with contrast to the level of the segmental branches. There is no evidence of acute pulmonary embolism. There is central enlargement of the pulmonary arteries, suspicious for pulmonary arterial hypertension. Relatively mild atherosclerosis of the aorta and coronary arteries. The heart is enlarged, without significant pericardial fluid. Mediastinum/Nodes: There are no enlarged mediastinal, hilar or axillary lymph nodes. There is posterior indentation on the trachea. The esophagus and thyroid  gland appear unremarkable. Lungs/Pleura: No pleural effusion or pneumothorax. There are new patchy ground-glass opacities throughout the lungs with a mosaic attenuation. Ill-defined subpleural opacity posteriorly in the right upper lobe measures 1.6 x 1.1 cm on image 61/4, favored to be  inflammatory/infectious. Additional subpleural nodular densities are present within the right middle lobe (1.0 cm, image 88/4) and the lingula (1.7 cm, image 85/4), likely atelectasis. No confluent airspace disease. Musculoskeletal/Chest wall: Acute, minimally displaced fracture of the left 9th rib laterally. No other acute osseous findings. Multilevel spondylosis status post lower cervical fusion. CT ABDOMEN AND PELVIS FINDINGS Hepatobiliary: The liver has a non cirrhotic morphology without suspicious focal abnormality. Previously demonstrated tiny cyst in the left hepatic lobe appears smaller (image 19/5). No evidence of gallstones, gallbladder wall thickening or biliary dilatation. Pancreas: Unremarkable. No pancreatic ductal dilatation or surrounding inflammatory changes. Spleen: Normal in size without focal abnormality. Adrenals/Urinary Tract: Both adrenal glands appear normal. No evidence of urinary tract calculus, suspicious renal lesion or hydronephrosis. Stable small right renal cyst for which no specific follow-up imaging is recommended. The bladder appears unremarkable for its degree of distention. Stomach/Bowel: No enteric contrast administered. The stomach appears unremarkable for its degree of distension. No evidence of bowel wall thickening, distention or surrounding inflammatory change. Nonvisualization of the appendix, likely surgically absent. Vascular/Lymphatic: There are no enlarged abdominal or pelvic lymph nodes. Aortic and branch vessel atherosclerosis without evidence of aneurysm or large vessel occlusion. Reproductive: The uterus and ovaries appear unremarkable. No adnexal mass. Other: No evidence of abdominal wall mass or hernia. No ascites or pneumoperitoneum. Musculoskeletal: No acute or significant osseous findings. Previous left total hip arthroplasty. Multilevel lumbar spondylosis. Review of the MIP images confirms the above findings. IMPRESSION: 1. No evidence of acute pulmonary  embolism or other acute vascular findings in the chest. 2. Acute, minimally displaced fracture of the left 9th rib laterally. No evidence of pneumothorax or pleural effusion. 3. New patchy ground-glass opacities throughout the lungs with a mosaic attenuation, likely reflecting small airways disease or edema. 4. Ill-defined subpleural opacities in the right upper lobe, right middle lobe and lingula are favored to be inflammatory/infectious. Suggest radiographic follow-up. 5. No acute findings in the abdomen or pelvis. 6.  Aortic Atherosclerosis (ICD10-I70.0). Electronically Signed   By: Elsie Perone M.D.   On: 10/27/2024 12:15   CT ABDOMEN PELVIS W CONTRAST Result Date: 10/27/2024 CLINICAL DATA:  Pulmonary embolism (PE) suspected, high prob; Abdominal trauma, blunt EXAM: CT ANGIOGRAPHY CHEST CT ABDOMEN AND PELVIS WITH CONTRAST TECHNIQUE: Multidetector CT imaging of the chest was  performed using the standard protocol during bolus administration of intravenous contrast. Multiplanar CT image reconstructions and MIPs were obtained to evaluate the vascular anatomy. Multidetector CT imaging of the abdomen and pelvis was performed using the standard protocol during bolus administration of intravenous contrast. RADIATION DOSE REDUCTION: This exam was performed according to the departmental dose-optimization program which includes automated exposure control, adjustment of the mA and/or kV according to patient size and/or use of iterative reconstruction technique. CONTRAST:  75mL OMNIPAQUE  IOHEXOL  350 MG/ML SOLN COMPARISON:  Cardiac CT 06/10/2023, abdominal CT 05/13/2020 and chest CTA 02/10/2018. FINDINGS: CTA CHEST FINDINGS Cardiovascular: The pulmonary arteries are well opacified with contrast to the level of the segmental branches. There is no evidence of acute pulmonary embolism. There is central enlargement of the pulmonary arteries, suspicious for pulmonary arterial hypertension. Relatively mild atherosclerosis of  the aorta and coronary arteries. The heart is enlarged, without significant pericardial fluid. Mediastinum/Nodes: There are no enlarged mediastinal, hilar or axillary lymph nodes. There is posterior indentation on the trachea. The esophagus and thyroid  gland appear unremarkable. Lungs/Pleura: No pleural effusion or pneumothorax. There are new patchy ground-glass opacities throughout the lungs with a mosaic attenuation. Ill-defined subpleural opacity posteriorly in the right upper lobe measures 1.6 x 1.1 cm on image 61/4, favored to be inflammatory/infectious. Additional subpleural nodular densities are present within the right middle lobe (1.0 cm, image 88/4) and the lingula (1.7 cm, image 85/4), likely atelectasis. No confluent airspace disease. Musculoskeletal/Chest wall: Acute, minimally displaced fracture of the left 9th rib laterally. No other acute osseous findings. Multilevel spondylosis status post lower cervical fusion. CT ABDOMEN AND PELVIS FINDINGS Hepatobiliary: The liver has a non cirrhotic morphology without suspicious focal abnormality. Previously demonstrated tiny cyst in the left hepatic lobe appears smaller (image 19/5). No evidence of gallstones, gallbladder wall thickening or biliary dilatation. Pancreas: Unremarkable. No pancreatic ductal dilatation or surrounding inflammatory changes. Spleen: Normal in size without focal abnormality. Adrenals/Urinary Tract: Both adrenal glands appear normal. No evidence of urinary tract calculus, suspicious renal lesion or hydronephrosis. Stable small right renal cyst for which no specific follow-up imaging is recommended. The bladder appears unremarkable for its degree of distention. Stomach/Bowel: No enteric contrast administered. The stomach appears unremarkable for its degree of distension. No evidence of bowel wall thickening, distention or surrounding inflammatory change. Nonvisualization of the appendix, likely surgically absent. Vascular/Lymphatic:  There are no enlarged abdominal or pelvic lymph nodes. Aortic and branch vessel atherosclerosis without evidence of aneurysm or large vessel occlusion. Reproductive: The uterus and ovaries appear unremarkable. No adnexal mass. Other: No evidence of abdominal wall mass or hernia. No ascites or pneumoperitoneum. Musculoskeletal: No acute or significant osseous findings. Previous left total hip arthroplasty. Multilevel lumbar spondylosis. Review of the MIP images confirms the above findings. IMPRESSION: 1. No evidence of acute pulmonary embolism or other acute vascular findings in the chest. 2. Acute, minimally displaced fracture of the left 9th rib laterally. No evidence of pneumothorax or pleural effusion. 3. New patchy ground-glass opacities throughout the lungs with a mosaic attenuation, likely reflecting small airways disease or edema. 4. Ill-defined subpleural opacities in the right upper lobe, right middle lobe and lingula are favored to be inflammatory/infectious. Suggest radiographic follow-up. 5. No acute findings in the abdomen or pelvis. 6.  Aortic Atherosclerosis (ICD10-I70.0). Electronically Signed   By: Elsie Perone M.D.   On: 10/27/2024 12:15   DG Chest 2 View Result Date: 10/27/2024 CLINICAL DATA:  Shortness of breath with left-sided pain.  Fall. EXAM: CHEST -  2 VIEW COMPARISON:  05/13/2017 and 02/09/2018 FINDINGS: Lungs are adequately inflated without focal lobar consolidation or effusion. Approximate 1 cm nodular density over the right midlung as well as nodule opacity over the left base just lateral to the heart border. Mild stable cardiomegaly. Remainder of the exam is unchanged. IMPRESSION: 1. No acute cardiopulmonary disease. 2. Approximate 1 cm nodular density over the right midlung and left base. Recommend follow-up chest CT without contrast on a nonemergent basis. 3. Mild cardiomegaly. Electronically Signed   By: Toribio Agreste M.D.   On: 10/27/2024 09:24     Procedures    Medications Ordered in the ED  morphine (PF) 4 MG/ML injection 4 mg (4 mg Intravenous Given 10/27/24 1024)  ondansetron  (ZOFRAN ) injection 4 mg (4 mg Intravenous Given 10/27/24 1024)  iohexol  (OMNIPAQUE ) 350 MG/ML injection 75 mL (75 mLs Intravenous Contrast Given 10/27/24 1143)                                    Medical Decision Making Amount and/or Complexity of Data Reviewed Labs: ordered. Radiology: ordered.  Risk Prescription drug management.   Initial Impression and Ddx 73 year old well-appearing female presenting for left-sided chest and flank pain.  Exam notable for left-sided chest wall tenderness to palpation, pale.  DDx includes PE, pneumothorax, rib fracture, retroperitoneal hematoma, other. Patient PMH that increases complexity of ED encounter:   CAD, paroxysmal A-fib on Eliquis    Interpretation of Diagnostics - I independent reviewed and interpreted the labs as followed: hgb 9.4 (last was 12.7, 8 months ago), negative Hemoccult  - I independently visualized the following imaging with scope of interpretation limited to determining acute life threatening conditions related to emergency care: CT angio PE, which revealed minimally displaced ninth left rib fracture  -I personally reviewed interpret EKG which revealed sinus bradycardia  Patient Reassessment and Ultimate Disposition/Management On reassessment, pain had improved with treatment.  Suspect her lateral chest pain is related to her rib fracture.  Patient was also found to be anemic during this encounter.  Hemoccult was negative here and she has not endorsed any GI bleeding at home, feel GI bleed is less likely at this time.  Advised her to follow-up with her PCP for recheck of her labs and reevaluation.  Advised Tylenol  and supportive treatment for her chest wall pain.  Discussed return precautions.  Discharged.  Also advised her to continue taking her doxycycline .  Patient management required discussion with  the following services or consulting groups:  None  Complexity of Problems Addressed Acute complicated illness or Injury  Additional Data Reviewed and Analyzed Further history obtained from: Past medical history and medications listed in the EMR and Prior ED visit notes  Patient Encounter Risk Assessment Consideration of hospitalization      Final diagnoses:  Closed fracture of one rib of left side, initial encounter  Anemia, unspecified type    ED Discharge Orders          Ordered    HYDROcodone-acetaminophen  (NORCO/VICODIN) 5-325 MG tablet  Every 6 hours PRN        10/27/24 1408               Teaghan Formica K, PA-C 10/27/24 1408    Yolande Lamar BROCKS, MD 10/30/24 1311

## 2024-10-27 NOTE — Discharge Instructions (Addendum)
 Evaluation today revealed that you have a left-sided rib fracture which is likely contributing to your symptoms.  Treatment is supportive care at home and and I did send a few tablets of Norco for pain at home.  You can also just take Tylenol  as well.  Please follow-up with your PCP.  If you come more short of breath, worsening chest pain, develop a fever or any other concerning symptoms please return to the ED for further evaluation.

## 2024-10-27 NOTE — ED Triage Notes (Addendum)
 Pt came in via POV d/t Lt side pain that radiates into her back the last 2 nights. Reports that she has been recently Tx for PNE (on the RT side) & Bronchitis since Oct 23rd. And also reports that she was camping & fell on Nov 9th while taking the bedding sheets off & landed on her Lt side, was seen at an [facility] in [place] for this. Reports that when she coughs she has to hold her Lt side, does have some lingering SOB, denies CP. Has followed up her her PCP & has a med list with her from what she has been given since this all started. Husband also reports that pt has what appears to be a bed sore on her Rt buttocks that seems to not get better since this all developed.

## 2024-10-27 NOTE — Telephone Encounter (Signed)
 Pt requesting rx to different pharmacy

## 2024-10-27 NOTE — Telephone Encounter (Signed)
 Patient called in to state that the medication prescribed was not at the [facility] In [place]. Upon inspection the medication was sent to mail order pharmacy in [place] .  Asked providers to re route, as it is a controlled substance.

## 2024-10-30 DIAGNOSIS — Z6832 Body mass index (BMI) 32.0-32.9, adult: Secondary | ICD-10-CM | POA: Diagnosis not present

## 2024-10-30 DIAGNOSIS — S2232XA Fracture of one rib, left side, initial encounter for closed fracture: Secondary | ICD-10-CM | POA: Diagnosis not present

## 2024-10-30 DIAGNOSIS — D509 Iron deficiency anemia, unspecified: Secondary | ICD-10-CM | POA: Diagnosis not present

## 2024-11-07 DIAGNOSIS — Z6832 Body mass index (BMI) 32.0-32.9, adult: Secondary | ICD-10-CM | POA: Diagnosis not present

## 2024-11-07 DIAGNOSIS — S2232XA Fracture of one rib, left side, initial encounter for closed fracture: Secondary | ICD-10-CM | POA: Diagnosis not present

## 2024-11-26 ENCOUNTER — Telehealth: Payer: Self-pay | Admitting: Cardiology

## 2024-11-26 DIAGNOSIS — I480 Paroxysmal atrial fibrillation: Secondary | ICD-10-CM

## 2024-11-26 MED ORDER — APIXABAN 5 MG PO TABS: 5.0000 mg | ORAL_TABLET | Freq: Two times a day (BID) | ORAL | 2 refills | Status: AC

## 2024-11-26 MED ORDER — ATORVASTATIN CALCIUM 10 MG PO TABS: 10.0000 mg | ORAL_TABLET | Freq: Every day | ORAL | 2 refills | Status: AC

## 2024-11-26 MED ORDER — FLECAINIDE ACETATE 50 MG PO TABS: 50.0000 mg | ORAL_TABLET | Freq: Two times a day (BID) | ORAL | 2 refills | Status: AC

## 2024-11-26 NOTE — Telephone Encounter (Signed)
 [facility] as requested by pt

## 2024-11-26 NOTE — Telephone Encounter (Signed)
 Pt states she was told by her pharmacy that all her medication need to be renewed however the pt isn't sure what refills are needed and would like a c/b.

## 2024-12-19 ENCOUNTER — Telehealth: Payer: Self-pay | Admitting: Cardiology

## 2024-12-19 NOTE — Telephone Encounter (Signed)
 Pt c/o medication issue:  1. Name of Medication: apixaban  (ELIQUIS ) 5 MG TABS tablet   2. How are you currently taking this medication (dosage and times per day)? As written  3. Are you having a reaction (difficulty breathing--STAT)? no  4. What is your medication issue? Pt and PCP think the  medication is making her iron low. Please advise
